# Patient Record
Sex: Male | Born: 1965 | Hispanic: No | Marital: Married | Smoking: Current every day smoker
Health system: Southern US, Community
[De-identification: ages and names within clinical notes are randomized; demographics above are authoritative.]

## PROBLEM LIST (undated history)

## (undated) DIAGNOSIS — G610 Guillain-Barre syndrome: Secondary | ICD-10-CM

## (undated) DIAGNOSIS — K5790 Diverticulosis of intestine, part unspecified, without perforation or abscess without bleeding: Secondary | ICD-10-CM

## (undated) DIAGNOSIS — G4730 Sleep apnea, unspecified: Secondary | ICD-10-CM

## (undated) DIAGNOSIS — E7800 Pure hypercholesterolemia, unspecified: Secondary | ICD-10-CM

## (undated) DIAGNOSIS — R002 Palpitations: Secondary | ICD-10-CM

## (undated) DIAGNOSIS — Z973 Presence of spectacles and contact lenses: Secondary | ICD-10-CM

## (undated) DIAGNOSIS — I10 Essential (primary) hypertension: Secondary | ICD-10-CM

## (undated) HISTORY — DX: Essential (primary) hypertension: I10

## (undated) HISTORY — DX: Diverticulosis of intestine, part unspecified, without perforation or abscess without bleeding: K57.90

## (undated) HISTORY — DX: Guillain-Barre syndrome: G61.0

## (undated) HISTORY — PX: OTHER SURGICAL HISTORY: SHX169

## (undated) HISTORY — PX: FRACTURE SURGERY: SHX138

## (undated) HISTORY — DX: Palpitations: R00.2

---

## 2003-10-14 HISTORY — PX: FRACTURE SURGERY: SHX138

## 2008-12-29 ENCOUNTER — Emergency Department: Payer: Self-pay | Admitting: Unknown Physician Specialty

## 2009-12-05 ENCOUNTER — Emergency Department: Payer: Self-pay | Admitting: Emergency Medicine

## 2009-12-05 ENCOUNTER — Encounter: Payer: Self-pay | Admitting: Cardiovascular Disease

## 2009-12-24 ENCOUNTER — Ambulatory Visit: Payer: Self-pay | Admitting: Cardiovascular Disease

## 2009-12-24 DIAGNOSIS — E781 Pure hyperglyceridemia: Secondary | ICD-10-CM | POA: Insufficient documentation

## 2009-12-24 DIAGNOSIS — R002 Palpitations: Secondary | ICD-10-CM | POA: Insufficient documentation

## 2009-12-24 DIAGNOSIS — I10 Essential (primary) hypertension: Secondary | ICD-10-CM | POA: Insufficient documentation

## 2010-11-12 NOTE — Progress Notes (Signed)
Summary: PHI  PHI   Imported By: Harlon Flor 12/26/2009 08:32:46  _____________________________________________________________________  External Attachment:    Type:   Image     Comment:   External Document
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
## 2011-04-15 ENCOUNTER — Emergency Department: Payer: Self-pay

## 2011-04-28 ENCOUNTER — Encounter: Payer: Self-pay | Admitting: Cardiovascular Disease

## 2014-01-03 ENCOUNTER — Ambulatory Visit: Payer: Self-pay | Admitting: Family Medicine

## 2015-01-31 ENCOUNTER — Emergency Department
Admit: 2015-01-31 | Disposition: A | Discharge status: Home / Self Care | Payer: Self-pay | Admitting: Emergency Medicine

## 2015-01-31 LAB — CBC WITH DIFFERENTIAL/PLATELET
Basophil #: 0 10*3/uL (ref 0.0–0.1)
Basophil %: 0.3 %
Eosinophil #: 0.1 10*3/uL (ref 0.0–0.7)
Eosinophil %: 0.5 %
HCT: 48 % (ref 40.0–52.0)
HGB: 16.7 g/dL (ref 13.0–18.0)
Lymphocyte #: 1.1 10*3/uL (ref 1.0–3.6)
Lymphocyte %: 8.5 %
MCH: 30.8 pg (ref 26.0–34.0)
MCHC: 34.7 g/dL (ref 32.0–36.0)
MCV: 89 fL (ref 80–100)
Monocyte #: 1.1 x10 3/mm — ABNORMAL HIGH (ref 0.2–1.0)
Monocyte %: 7.9 %
Neutrophil #: 11.2 10*3/uL — ABNORMAL HIGH (ref 1.4–6.5)
Neutrophil %: 82.8 %
Platelet: 204 10*3/uL (ref 150–440)
RBC: 5.42 10*6/uL (ref 4.40–5.90)
RDW: 12.4 % (ref 11.5–14.5)
WBC: 13.5 10*3/uL — ABNORMAL HIGH (ref 3.8–10.6)

## 2015-01-31 LAB — COMPREHENSIVE METABOLIC PANEL
Albumin: 4.6 g/dL
Alkaline Phosphatase: 66 U/L
Anion Gap: 11 (ref 7–16)
BUN: 15 mg/dL
Bilirubin,Total: 0.7 mg/dL
Calcium, Total: 9.8 mg/dL
Chloride: 101 mmol/L
Co2: 25 mmol/L
Creatinine: 1.18 mg/dL
EGFR (African American): >60
EGFR (Non-African Amer.): >60
Glucose: 119 mg/dL — ABNORMAL HIGH
Potassium: 4.5 mmol/L
SGOT(AST): 84 U/L — ABNORMAL HIGH
SGPT (ALT): 181 U/L — ABNORMAL HIGH
Sodium: 137 mmol/L
Total Protein: 8.1 g/dL

## 2015-01-31 LAB — URINALYSIS, COMPLETE
Bacteria: NONE SEEN
Bilirubin,UR: NEGATIVE
Glucose,UR: NEGATIVE mg/dL (ref 0–75)
Ketone: NEGATIVE
Leukocyte Esterase: NEGATIVE
Nitrite: NEGATIVE
Ph: 6 (ref 4.5–8.0)
Protein: NEGATIVE
RBC,UR: NONE SEEN /HPF (ref 0–5)
Specific Gravity: 1.01 (ref 1.003–1.030)
Squamous Epithelial: NONE SEEN

## 2015-01-31 LAB — LIPASE, BLOOD: Lipase: 32 U/L

## 2015-06-04 ENCOUNTER — Ambulatory Visit (INDEPENDENT_AMBULATORY_CARE_PROVIDER_SITE_OTHER): Payer: 59 | Admitting: Family Medicine

## 2015-06-04 ENCOUNTER — Encounter: Payer: Self-pay | Admitting: Family Medicine

## 2015-06-04 VITALS — BP 120/90 | HR 70 | Temp 98.3°F | Resp 16 | Ht 69.5 in | Wt 197.8 lb

## 2015-06-04 DIAGNOSIS — K5733 Diverticulitis of large intestine without perforation or abscess with bleeding: Secondary | ICD-10-CM | POA: Diagnosis not present

## 2015-06-04 MED ORDER — HYDROCODONE-ACETAMINOPHEN 5-325 MG PO TABS: 1.0000 | ORAL_TABLET | Freq: Four times a day (QID) | ORAL | Status: DC | PRN

## 2015-06-04 MED ORDER — METRONIDAZOLE 500 MG PO TABS: 500.0000 mg | ORAL_TABLET | Freq: Two times a day (BID) | ORAL | Status: DC

## 2015-06-04 MED ORDER — CIPROFLOXACIN HCL 500 MG PO TABS: 500.0000 mg | ORAL_TABLET | Freq: Two times a day (BID) | ORAL | Status: DC

## 2015-06-04 NOTE — Progress Notes (Signed)
Subjective:     Patient ID: Taylor Medina, male   DOB: 1965-11-25, 49 y.o.   MRN: [id number]  HPI  Chief Complaint  Patient presents with  . Abdominal Pain    Patient comes in office today with concerns of abdominal pain and cramping for about 3 days or more. Patient states that he has a history of diverticulitis and has concerns that he has it again. Patient reports lower abdominal pain described as sharp, and blood in stool.   States pain is in his lower abdomen. Noticed bright red blood yesterday but none today. Treated for diverticulitis in April of this year at [facility] ER. CT scan with "extensive colonic diverticuli. Reports 20 year old brother just diagnosed with advanced colon cancer.   Review of Systems  Constitutional: Negative for fever and chills.  Gastrointestinal: Negative for nausea and vomiting.       Objective:   Physical Exam  Constitutional: He appears well-developed and well-nourished. No distress.  Abdominal: Soft. There is tenderness (lower mid to right lower quadrants). There is no guarding.       Assessment:    1. Diverticulitis of large intestine without perforation or abscess with bleeding - ciprofloxacin (CIPRO) 500 MG tablet; Take 1 tablet (500 mg total) by mouth 2 (two) times daily.  Dispense: 14 tablet; Refill: 0 - metroNIDAZOLE (FLAGYL) 500 MG tablet; Take 1 tablet (500 mg total) by mouth 2 (two) times daily.  Dispense: 14 tablet; Refill: 0 - HYDROcodone-acetaminophen (NORCO/VICODIN) 5-325 MG per tablet; Take 1 tablet by mouth every 6 (six) hours as needed.  Dispense: 28 tablet; Refill: 0    Plan:   Screening colonoscopy after this flare.

## 2015-06-04 NOTE — Patient Instructions (Signed)
Discussed clear liquid diet for 24-48 hours. Once better call for G.I. Referral for early colonoscopy.

## 2015-06-28 ENCOUNTER — Encounter: Payer: Self-pay | Admitting: Family Medicine

## 2015-06-28 ENCOUNTER — Ambulatory Visit (INDEPENDENT_AMBULATORY_CARE_PROVIDER_SITE_OTHER): Payer: 59 | Admitting: Family Medicine

## 2015-06-28 VITALS — BP 110/70 | HR 60 | Temp 98.6°F | Resp 16 | Wt 199.0 lb

## 2015-06-28 DIAGNOSIS — W57XXXA Bitten or stung by nonvenomous insect and other nonvenomous arthropods, initial encounter: Secondary | ICD-10-CM | POA: Diagnosis not present

## 2015-06-28 DIAGNOSIS — T148 Other injury of unspecified body region: Secondary | ICD-10-CM

## 2015-06-28 NOTE — Patient Instructions (Addendum)
Try hydrocortisone cream for redness and itching. Monitor for flu like symptoms in the next 2-4 weeks.

## 2015-06-28 NOTE — Progress Notes (Signed)
Subjective:     Patient ID: Taylor Medina, male   DOB: Aug 31, 1966, 49 y.o.   MRN: [id number]  HPI  Chief Complaint  Patient presents with  . Insect Bite    Patient comes in office today with concern of tick bite that he recieved 2 days ago. Patient reports that tick was located on left side of his upper back and that his girlfriend removed it with her nails. Patient states that area is itchy, red and swollen.   Reports no flu like sx.   Review of Systems  HENT: Positive for tinnitus (reports tinnitus yesterday which is spontaneously resolving).        Objective:   Physical Exam  Constitutional: He appears well-developed and well-nourished. No distress.  Skin:  Insect back on left upper back with linear area of inflammatory flare. No f.b. Visualized with magnification.       Assessment:    1. Tick bite    Plan:   Discussed use of hydrocortisone cream. Monitor for rash improvement and for flu-like sx over the next 2-4 weeks.

## 2015-09-21 ENCOUNTER — Ambulatory Visit (INDEPENDENT_AMBULATORY_CARE_PROVIDER_SITE_OTHER): Payer: 59 | Admitting: Family Medicine

## 2015-09-21 ENCOUNTER — Encounter: Payer: Self-pay | Admitting: Family Medicine

## 2015-09-21 VITALS — BP 114/86 | HR 82 | Temp 98.3°F | Resp 14 | Wt 202.0 lb

## 2015-09-21 DIAGNOSIS — J019 Acute sinusitis, unspecified: Secondary | ICD-10-CM

## 2015-09-21 MED ORDER — DOXYCYCLINE HYCLATE 100 MG PO TABS: 100.0000 mg | ORAL_TABLET | Freq: Two times a day (BID) | ORAL | Status: DC

## 2015-09-21 MED ORDER — HYDROCODONE-HOMATROPINE 5-1.5 MG/5ML PO SYRP: ORAL_SOLUTION | ORAL | Status: DC

## 2015-09-21 NOTE — Progress Notes (Signed)
Subjective:     Patient ID: Taylor Medina, male   DOB: 12/18/1965, 49 y.o.   MRN: [id number]  HPI  Chief Complaint  Patient presents with  . Cough    Symptoms started 1 week ago. At that time he had runny nose, sneezing, headache, sinus pressure and low grade fever. Now it feels like it is all in his chest, coughing-productive-thick/yellow phlegm, chest congestion, SOB, some wheezing. He has taking Mucinex, Alkelsetzer.  Reports persistent purulent sinus congestion. No hx of asthma. Day #9 of symptoms. Accompanied by his wife today.   Review of Systems  Constitutional: Positive for fever (transient low grade fever). Negative for chills.       Objective:   Physical Exam  Constitutional: He appears well-developed and well-nourished. No distress.  Ears: T.M's intact without inflammation Sinuses: non-tender Throat: moderate tonsillar erythema and enlargement Neck: no cervical adenopathy Lungs: clear     Assessment:    1. Acute sinusitis, recurrence not specified, unspecified location - doxycycline (VIBRA-TABS) 100 MG tablet; Take 1 tablet (100 mg total) by mouth 2 (two) times daily.  Dispense: 20 tablet; Refill: 0 - HYDROcodone-homatropine (HYCODAN) 5-1.5 MG/5ML syrup; 5 ml 4-6 hours as needed for cough  Dispense: 240 mL; Refill: 0    Plan:    Discussed use of Mucinex D for congestion, Delsym for cough, and Benadryl for postnasal drainage

## 2015-09-21 NOTE — Patient Instructions (Signed)
Discussed use of Mucinex D for congestion, Delsym for cough, and Benadryl for postnasal drainage 

## 2015-09-26 ENCOUNTER — Telehealth: Payer: Self-pay | Admitting: Family Medicine

## 2015-09-26 NOTE — Telephone Encounter (Signed)
Pt states he was in last week.  Pt is still having yellow sinus congestion and drainage.  Pt is also has a headache.  Pt states his cough is better.  Pt is requesting a Rx to help with the sinus issues.  [facility]  CB#[phone]/MW

## 2015-09-26 NOTE — Telephone Encounter (Signed)
Give the antibiotic longer to work. Call me Friday if sinuses not improving.

## 2015-09-26 NOTE — Telephone Encounter (Signed)
Patient states that he had started antibiotic on Friday he has noticed improvement in his cough and chest congestion, but states that he has been having sinus pain and pressure still and frequent headaches. KW

## 2015-09-26 NOTE — Telephone Encounter (Signed)
Are you taking the antibiotic that I prescribed, doxycycline?

## 2015-09-27 NOTE — Telephone Encounter (Signed)
Patient has been advised and will contact us back on Friday

## 2016-02-29 ENCOUNTER — Ambulatory Visit
Admission: RE | Admit: 2016-02-29 | Discharge: 2016-02-29 | Disposition: A | Discharge status: Home / Self Care | Payer: 59 | Source: Ambulatory Visit | Attending: Family Medicine | Admitting: Family Medicine

## 2016-02-29 ENCOUNTER — Ambulatory Visit (INDEPENDENT_AMBULATORY_CARE_PROVIDER_SITE_OTHER): Payer: 59 | Admitting: Family Medicine

## 2016-02-29 ENCOUNTER — Encounter: Payer: Self-pay | Admitting: Family Medicine

## 2016-02-29 VITALS — BP 110/78 | HR 68 | Temp 98.0°F | Resp 16 | Wt 197.4 lb

## 2016-02-29 DIAGNOSIS — E785 Hyperlipidemia, unspecified: Secondary | ICD-10-CM | POA: Diagnosis not present

## 2016-02-29 DIAGNOSIS — R918 Other nonspecific abnormal finding of lung field: Secondary | ICD-10-CM | POA: Insufficient documentation

## 2016-02-29 DIAGNOSIS — R079 Chest pain, unspecified: Secondary | ICD-10-CM

## 2016-02-29 LAB — EKG 12-LEAD

## 2016-02-29 MED ORDER — DIAZEPAM 5 MG PO TABS: ORAL_TABLET | ORAL | Status: DC

## 2016-02-29 NOTE — Patient Instructions (Signed)
We will call you with the x-ray and lab results. 

## 2016-02-29 NOTE — Progress Notes (Signed)
Subjective:     Patient ID: Taylor Medina, male   DOB: 1966/08/04, 50 y.o.   MRN: [id number] Chief Complaint  Patient presents with  . Chest Pain     Patient comes in office today with complaints of chest pain located on the left side of his chest. Patient describes pain as sharp and states that it radiates down his left arm. Patient denies shortness of breath but states that he has heart palpitations, patient reports that he has had a cough for sometime now mostly in the PM and was unsure if it was related. Patient blood pressure this morning was 151/97 and after getting checked at work it went up to 138/101.  Reports pain lasst just a second or two but has had increasing daily episodes over the last few days. Took ASA today and used prn apresoline as his bp was elevated at work. No recent labs on record. 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 HPI   Review of Systems     Objective:   Physical Exam  Constitutional: He appears well-developed and well-nourished. No distress.  Cardiovascular: Normal rate.   bradycardic  Pulmonary/Chest: Breath sounds normal. He exhibits no tenderness (localies to  his pectoral and sub-pectoral area).       Assessment:    1. Chest pain, unspecified chest pain type - EKG 12-Lead - DG Chest 2 View; Future - Comprehensive metabolic panel - diazepam (VALIUM) 5 MG tablet; One every 8 hours as needed for chest discomfort/stress  Dispense: 30 tablet; Refill: 0  2. Hyperlipidemia - Lipid panel    Plan:    Further f/u pending x-ray and lab results

## 2016-03-04 ENCOUNTER — Telehealth: Payer: Self-pay

## 2016-03-04 LAB — COMPREHENSIVE METABOLIC PANEL
ALT: 121 IU/L — ABNORMAL HIGH (ref 0–44)
AST: 50 IU/L — ABNORMAL HIGH (ref 0–40)
Albumin/Globulin Ratio: 1.6 (ref 1.2–2.2)
Albumin: 4.4 g/dL (ref 3.5–5.5)
Alkaline Phosphatase: 70 IU/L (ref 39–117)
BUN/Creatinine Ratio: 12 (ref 9–20)
BUN: 12 mg/dL (ref 6–24)
Bilirubin Total: 0.4 mg/dL (ref 0.0–1.2)
CO2: 20 mmol/L (ref 18–29)
Calcium: 9.2 mg/dL (ref 8.7–10.2)
Chloride: 97 mmol/L (ref 96–106)
Creatinine, Ser: 1.02 mg/dL (ref 0.76–1.27)
GFR calc Af Amer: 101 mL/min/{1.73_m2} (ref >59)
GFR calc non Af Amer: 87 mL/min/{1.73_m2} (ref >59)
Globulin, Total: 2.7 g/dL (ref 1.5–4.5)
Glucose: 88 mg/dL (ref 65–99)
Potassium: 4.9 mmol/L (ref 3.5–5.2)
Sodium: 138 mmol/L (ref 134–144)
Total Protein: 7.1 g/dL (ref 6.0–8.5)

## 2016-03-04 LAB — LIPID PANEL
Chol/HDL Ratio: 11.2 ratio units — ABNORMAL HIGH (ref 0.0–5.0)
Cholesterol, Total: 268 mg/dL — ABNORMAL HIGH (ref 100–199)
HDL: 24 mg/dL — ABNORMAL LOW (ref >39)
Triglycerides: 1506 mg/dL (ref 0–149)

## 2016-03-04 NOTE — Telephone Encounter (Signed)
Patient has been advised of report, spoke with [facility] rep and had additonal lab orderded. KW

## 2016-03-04 NOTE — Telephone Encounter (Signed)
-----   Message from [name], [place] sent at 03/04/2016  8:01 AM EDT ----- Your cholesterol is high but will add another lab to your current draw to get you LDL level. Please add direct LDL to his current lab draw.

## 2016-03-05 ENCOUNTER — Telehealth: Payer: Self-pay

## 2016-03-05 LAB — LDL CHOLESTEROL, DIRECT: LDL Direct: 63 mg/dL (ref 0–99)

## 2016-03-05 LAB — SPECIMEN STATUS REPORT

## 2016-03-05 NOTE — Telephone Encounter (Signed)
Patient has been advised and is scheduling a follow up appt to discuss. KW

## 2016-03-05 NOTE — Telephone Encounter (Signed)
-----   Message from Rand, [place] sent at 03/05/2016  9:48 AM EDT ----- LDL cholesterol is good at 63 but triglycerides are very high. May wish to come in to the office in the next week to discuss.

## 2016-03-14 ENCOUNTER — Ambulatory Visit (INDEPENDENT_AMBULATORY_CARE_PROVIDER_SITE_OTHER): Payer: 59 | Admitting: Family Medicine

## 2016-03-14 ENCOUNTER — Other Ambulatory Visit: Payer: Self-pay | Admitting: Family Medicine

## 2016-03-14 DIAGNOSIS — E781 Pure hyperglyceridemia: Secondary | ICD-10-CM

## 2016-03-14 DIAGNOSIS — J40 Bronchitis, not specified as acute or chronic: Secondary | ICD-10-CM

## 2016-03-14 MED ORDER — CEFDINIR 300 MG PO CAPS
300.0000 mg | ORAL_CAPSULE | Freq: Two times a day (BID) | ORAL | Status: DC
Start: 2016-03-14 — End: 2016-07-28

## 2016-03-14 NOTE — Patient Instructions (Signed)
Let me know how you did on the antibiotic. We will set up follow up chest x-ray at that time.

## 2016-03-14 NOTE — Progress Notes (Signed)
Patient ID: Taylor Medina, male   DOB: 10-Jan-1966, 50 y.o.   MRN: [id number] Here to review his elevated triglycerides. Will provide diet sheet for now. He is concerned about persistent productive cough x one month and CXR with faint nodule. Discussed treating him for bronchitis then repeating CXR. Repeat lipid profile in 3 months. No charge for this lab review today.

## 2016-03-27 ENCOUNTER — Telehealth: Payer: Self-pay

## 2016-03-27 ENCOUNTER — Other Ambulatory Visit: Payer: Self-pay | Admitting: Family Medicine

## 2016-03-27 DIAGNOSIS — R9389 Abnormal findings on diagnostic imaging of other specified body structures: Secondary | ICD-10-CM

## 2016-03-27 NOTE — Telephone Encounter (Signed)
Patient called saying that he still has constant chest pain. He reports that the pain is located just under the left side of his collarbone. Patient reports that the pain is worse when palpitating. He reports that he has completed his antibiotics, and it did help with his cough. Patient has not been using any other medications OTC to help with pain. Patient wanted to know what else could be done to resolve the pain? He reports that he can see you today after 4pm or come in tomorrow. Please advise. Contact number is [phone]. Thanks!

## 2016-03-27 NOTE — Telephone Encounter (Signed)
Have him pick up x-ray requisition to do f/u CXR (I have placed it up front) and start two Aleve twice daily with food.

## 2016-03-28 ENCOUNTER — Ambulatory Visit
Admission: RE | Admit: 2016-03-28 | Discharge: 2016-03-28 | Disposition: A | Discharge status: Home / Self Care | Payer: 59 | Source: Ambulatory Visit | Attending: Family Medicine | Admitting: Family Medicine

## 2016-03-28 DIAGNOSIS — R938 Abnormal findings on diagnostic imaging of other specified body structures: Secondary | ICD-10-CM | POA: Diagnosis present

## 2016-03-28 DIAGNOSIS — R9389 Abnormal findings on diagnostic imaging of other specified body structures: Secondary | ICD-10-CM

## 2016-03-28 DIAGNOSIS — R918 Other nonspecific abnormal finding of lung field: Secondary | ICD-10-CM | POA: Diagnosis not present

## 2016-03-28 NOTE — Telephone Encounter (Signed)
Advised patient as below.  

## 2016-07-28 ENCOUNTER — Encounter: Payer: Self-pay | Admitting: Urology

## 2016-07-28 ENCOUNTER — Ambulatory Visit (INDEPENDENT_AMBULATORY_CARE_PROVIDER_SITE_OTHER): Payer: 59 | Admitting: Urology

## 2016-07-28 VITALS — BP 128/80 | HR 64 | Ht 69.0 in | Wt 195.1 lb

## 2016-07-28 DIAGNOSIS — N138 Other obstructive and reflux uropathy: Secondary | ICD-10-CM

## 2016-07-28 DIAGNOSIS — N529 Male erectile dysfunction, unspecified: Secondary | ICD-10-CM | POA: Diagnosis not present

## 2016-07-28 DIAGNOSIS — E291 Testicular hypofunction: Secondary | ICD-10-CM

## 2016-07-28 DIAGNOSIS — N401 Enlarged prostate with lower urinary tract symptoms: Secondary | ICD-10-CM | POA: Diagnosis not present

## 2016-07-28 MED ORDER — SILDENAFIL CITRATE 100 MG PO TABS: 100.0000 mg | ORAL_TABLET | Freq: Every day | ORAL | 12 refills | Status: DC | PRN

## 2016-07-28 MED ORDER — SILDENAFIL CITRATE 20 MG PO TABS: ORAL_TABLET | ORAL | 3 refills | Status: DC

## 2016-07-28 NOTE — Progress Notes (Signed)
07/28/2016 4:21 PM   [name] 20-Sep-1966 [id number]  Referring provider: [name], [facility] [place] [place], [place] [address]  Chief Complaint  Patient presents with  . New Patient (Initial Visit)    ED    HPI: Patient is a 50 year old Caucasian male who presents today for erectile dysfunction referred by his primary care provider [name], [place].  Patient is a former patient of ours, but he has not been seen in several years.  He also has a history of hypogonadism and BPH with LUTS.    Erectile dysfunction His SHIM score is 14, which is mild to moderate ED.   He has been having difficulty with erections for several years.   His major complaint is lack of firmness and maintaining an erections satisfactory for intercourse.  His libido is diminished.   His risk factors for ED are age, BPH, hypogonadism, HTN, HLD, hypothyroidism, anxiety, alcohol abuse, history of smoking and blood pressure medications.    He denies any painful erections or curvatures with his erections.   He has tried PDE5-inhibitors in the past.       [place] Name 07/28/16 1541         SHIM: Over the last 6 months:   How do you rate your confidence that you could get and keep an erection? Low     When you had erections with sexual stimulation, how often were your erections hard enough for penetration (entering your partner)? Sometimes (about half the time)     During sexual intercourse, how often were you able to maintain your erection after you had penetrated (entered) your partner? Difficult     During sexual intercourse, how difficult was it to maintain your erection to completion of intercourse? Difficult     When you attempted sexual intercourse, how often was it satisfactory for you? Difficult       SHIM Total Score   SHIM 14        Score: 1-7 Severe ED 8-11 Moderate ED 12-16 Mild-Moderate ED 17-21 Mild ED 22-25 No ED   Hypogonadism Patient is experiencing a  decrease in libido, a lack of energy, a decrease in strength and erections being less strong.  This is indicated by his responses to the ADAM questionnaire.   He is no longer having spontaneous erections at night.  He does not have sleep apnea.      Androgen Deficiency in the Aging Male    [place] Name 07/28/16 1500         Androgen Deficiency in the Aging Male   Do you have a decrease in libido (sex drive) Yes     Do you have lack of energy Yes     Do you have a decrease in strength and/or endurance Yes     Have you lost height No     Have you noticed a decreased "enjoyment of life" No     Are you sad and/or grumpy No     Are your erections less strong Yes     Have you noticed a recent deterioration in your ability to play sports No     Are you falling asleep after dinner No     Has there been a recent deterioration in your work performance No       BPH WITH LUTS His IPSS score today is 6, which is mild lower urinary tract symptomatology. He is mostly satisfied with his quality life due  to his urinary symptoms.  His major complaint today nocturia and post void dribbling.  His nocturia occurs five times a month.  He has had these symptoms for several years.  He denies any dysuria, hematuria or suprapubic pain.  He also denies any recent fevers, chills, nausea or vomiting.  He does not have a family history of PCa.     IPSS    [place] Name 07/28/16 1500         International Prostate Symptom Score   How often have you had the sensation of not emptying your bladder? Not at All     How often have you had to urinate less than every two hours? Not at All     How often have you found you stopped and started again several times when you urinated? Not at All     How often have you found it difficult to postpone urination? Less than 1 in 5 times     How often have you had a weak urinary stream? Not at All     How often have you had to strain to start urination? Not at All     How many times did  you typically get up at night to urinate? 5 Times     Total IPSS Score 6       Quality of Life due to urinary symptoms   If you were to spend the rest of your life with your urinary condition just the way it is now how would you feel about that? Mostly Satisfied        Score:  1-7 Mild 8-19 Moderate 20-35 Severe   PMH: Past Medical History:  Diagnosis Date  . HTN (hypertension)    episodic   . Palpitations     Surgical History: Past Surgical History:  Procedure Laterality Date  . hardware fractions     on R; several surgeries   . hernia repair surgery      Home Medications:    Medication List       Accurate as of 07/28/16  4:21 PM. Always use your most recent med list.          diazepam 5 MG tablet Commonly known as:  VALIUM One every 8 hours as needed for chest discomfort/stress   FLONASE 50 MCG/ACT nasal spray Generic drug:  fluticasone Place 1 spray into the nose daily. Reported on 02/29/2016   hydrALAZINE 10 MG tablet Commonly known as:  APRESOLINE Take 10 mg by mouth 3 (three) times daily as needed.   ibuprofen 200 MG tablet Commonly known as:  ADVIL,MOTRIN Take 400 mg by mouth every 6 (six) hours as needed.   ranitidine 150 MG capsule Commonly known as:  ZANTAC Take 150 mg by mouth 2 (two) times daily.   sildenafil 20 MG tablet Commonly known as:  REVATIO Take 3 to 5 tablets two hours before intercouse on an empty stomach.  Do not take with nitrates.       Allergies:  Allergies  Allergen Reactions  . Penicillins     Family History: Family History  Problem Relation Age of Onset  . Cancer      family hx  . Coronary artery disease      family hx  . Diabetes      family hx    Social History:  reports that he quit smoking about 3 years ago. He has never used smokeless tobacco. He reports that he drinks alcohol. He reports that he does  not use drugs.  ROS: UROLOGY Frequent Urination?: No Hard to postpone urination?:  No Burning/pain with urination?: No Get up at night to urinate?: Yes Leakage of urine?: Yes Urine stream starts and stops?: No Trouble starting stream?: No Do you have to strain to urinate?: No Blood in urine?: No Urinary tract infection?: No Sexually transmitted disease?: No Injury to kidneys or bladder?: No Painful intercourse?: No Weak stream?: No Erection problems?: Yes Penile pain?: No  Gastrointestinal Nausea?: No Vomiting?: No Indigestion/heartburn?: Yes Diarrhea?: No Constipation?: No  Constitutional Fever: No Night sweats?: No Weight loss?: No Fatigue?: Yes  Skin Skin rash/lesions?: No Itching?: No  Eyes Blurred vision?: No Double vision?: No  Ears/Nose/Throat Sore throat?: No Sinus problems?: No  Hematologic/Lymphatic Swollen glands?: No Easy bruising?: No  Cardiovascular Leg swelling?: No Chest pain?: No  Respiratory Cough?: No Shortness of breath?: No  Endocrine Excessive thirst?: No  Musculoskeletal Back pain?: No Joint pain?: Yes  Neurological Headaches?: No Dizziness?: No  Psychologic Depression?: No Anxiety?: No  Physical Exam: BP 128/80   Pulse 64   Ht 5\' 9"  (1.753 m)   Wt 195 lb 1.6 oz (88.5 kg)   BMI 28.81 kg/m   Constitutional: Well nourished. Alert and oriented, No acute distress. HEENT: [place] AT, moist mucus membranes. Trachea midline, no masses. Cardiovascular: No clubbing, cyanosis, or edema. Respiratory: Normal respiratory effort, no increased work of breathing. GI: Abdomen is soft, non tender, non distended, no abdominal masses. Liver and spleen not palpable.  No hernias appreciated.  Stool sample for occult testing is not indicated.   GU: No CVA tenderness.  No bladder fullness or masses.  Patient with circumcised phallus.  Urethral meatus is patent.  No penile discharge. No penile lesions or rashes. Scrotum without lesions, cysts, rashes and/or edema.  Testicles are located scrotally bilaterally. No masses are  appreciated in the testicles. Left and right epididymis are normal. Rectal: Patient with  normal sphincter tone. Anus and perineum without scarring or rashes. No rectal masses are appreciated. Prostate is approximately 45 grams, no nodules are appreciated. Seminal vesicles are normal. Skin: No rashes, bruises or suspicious lesions. Lymph: No cervical or inguinal adenopathy. Neurologic: Grossly intact, no focal deficits, moving all 4 extremities. Psychiatric: Normal mood and affect.  Laboratory Data: Lab Results  Component Value Date   WBC 13.5 (H) 01/31/2015   HGB 16.7 01/31/2015   HCT 48.0 01/31/2015   MCV 89 01/31/2015   PLT 204 01/31/2015    Lab Results  Component Value Date   CREATININE 1.02 03/03/2016        Component Value Date/Time   CHOL 268 (H) 03/03/2016 1112   HDL 24 (L) 03/03/2016 1112   CHOLHDL 11.2 (H) 03/03/2016 1112   [place] Comment 03/03/2016 1112    Lab Results  Component Value Date   AST 50 (H) 03/03/2016   Lab Results  Component Value Date   ALT 121 (H) 03/03/2016     Assessment & Plan:    1. Erectile dysfunction  - SHIM score is 14  - I explained to the patient that in order to achieve an erection it takes good functioning of the nervous system (parasympathetic, sympathetic, sensory and motor), good blood flow into the erectile tissue of the penis and a desire to have sex  - I explained that conditions like diabetes, hypertension, coronary artery disease, peripheral vascular disease, smoking, alcohol consumption, age, sleep apnea and BPH can diminish the ability to have an erection  - We discussed restarting  a PDE5 inhibitor at this time  - He would like to try sildenafil  - RTC in 12 months for repeat SHIM score and exam   2. Hypogonadism  - I explained to patient that the current recommendations from the Endocrine Society reports the diagnosis of hypogonadism requires a serum total testosterone level obtained between 8 and 10 AM at least 2  days apart that is below the laboratory parameters  for normal testosterone  - At this time, the patient does not meet this requirement.  He will return for two morning serum testosterones, two days apart before 10 AM   -I discussed with the patient the side effects of testosterone therapy, such as: enlargement of the prostate gland that may in turn cause LUTS, possible increased risk of PCa, DVT's and/or PE's, possible increased risk of heart attack or stroke, lower sperm count, swelling of the ankles, feet, or body, with or without heart failure, enlarged or painful breasts, have problems breathing while you sleep (sleep apnea), increased prostate specific antigen, mood swings, hypertension and increased red blood cell count.  - I also discussed that some men have had success using clomid for hypogonadism.  It does seem to be more successful in younger men, but there are incidences of good results in middle-aged men.  I explained that it is used in male infertility to stimulate the testicles to make more testosterone/sperm.  There has been no long term data on side effects, but some urologists has been having success with this medication.   3. BPH with LUTS  - IPSS score is 6  - Continue conservative management, avoiding bladder irritants and timed voiding's  - RTC in 12 months for IPSS and exam    Return for patient to return in the morning before 10 AM two days apart for serum testosterone.  These notes generated with voice recognition software. I apologize for typographical errors.  Zara Council, [facility] [address] [place], [place] [address] [phone]

## 2016-07-29 ENCOUNTER — Other Ambulatory Visit: Payer: Self-pay

## 2016-07-29 ENCOUNTER — Other Ambulatory Visit: Payer: 59

## 2016-07-29 DIAGNOSIS — E291 Testicular hypofunction: Secondary | ICD-10-CM

## 2016-07-30 ENCOUNTER — Other Ambulatory Visit: Payer: 59

## 2016-07-30 DIAGNOSIS — E291 Testicular hypofunction: Secondary | ICD-10-CM

## 2016-07-31 ENCOUNTER — Telehealth: Payer: Self-pay

## 2016-07-31 LAB — TESTOSTERONE: Testosterone: 264 ng/dL (ref 264–916)

## 2016-07-31 NOTE — Telephone Encounter (Signed)
Spoke with pt in reference testosterone results. Pt voiced understanding.

## 2016-07-31 NOTE — Telephone Encounter (Signed)
-----   Message from Nori Riis, PA-C sent at 07/31/2016  8:10 AM EDT ----- Patient's testosterone is normal.  He should be having a second testosterone being drawn this week or next.

## 2016-08-04 ENCOUNTER — Other Ambulatory Visit: Payer: Self-pay

## 2016-08-04 DIAGNOSIS — E291 Testicular hypofunction: Secondary | ICD-10-CM

## 2016-08-05 ENCOUNTER — Other Ambulatory Visit: Payer: 59

## 2016-08-08 ENCOUNTER — Other Ambulatory Visit: Payer: 59

## 2016-08-08 DIAGNOSIS — E291 Testicular hypofunction: Secondary | ICD-10-CM

## 2016-08-09 LAB — TESTOSTERONE: Testosterone: 305 ng/dL (ref 264–916)

## 2016-08-11 ENCOUNTER — Telehealth: Payer: Self-pay

## 2016-08-11 NOTE — Telephone Encounter (Signed)
LMOM- both testosterone results are within normal limits therefore do not meet criteria for hypogonadism

## 2016-08-11 NOTE — Telephone Encounter (Signed)
-----   Message from Nori Riis, PA-C sent at 08/10/2016  9:26 PM EDT ----- Please notify the patient that his testosterone levels are within the normal limits as designated by laboratory parameters. At this time he is not hypogonadal and is not a candidate for testosterone therapy.

## 2016-10-24 ENCOUNTER — Encounter: Payer: Self-pay | Admitting: Family Medicine

## 2016-10-24 ENCOUNTER — Ambulatory Visit (INDEPENDENT_AMBULATORY_CARE_PROVIDER_SITE_OTHER): Payer: 59 | Admitting: Family Medicine

## 2016-10-24 VITALS — BP 124/96 | HR 88 | Temp 99.0°F | Resp 16 | Wt 197.2 lb

## 2016-10-24 DIAGNOSIS — F419 Anxiety disorder, unspecified: Secondary | ICD-10-CM | POA: Diagnosis not present

## 2016-10-24 DIAGNOSIS — S199XXA Unspecified injury of neck, initial encounter: Secondary | ICD-10-CM

## 2016-10-24 DIAGNOSIS — G4730 Sleep apnea, unspecified: Secondary | ICD-10-CM | POA: Insufficient documentation

## 2016-10-24 DIAGNOSIS — G4733 Obstructive sleep apnea (adult) (pediatric): Secondary | ICD-10-CM | POA: Insufficient documentation

## 2016-10-24 MED ORDER — DIAZEPAM 5 MG PO TABS: ORAL_TABLET | ORAL | 0 refills | Status: DC

## 2016-10-24 MED ORDER — HYDROCODONE-ACETAMINOPHEN 5-325 MG PO TABS: ORAL_TABLET | ORAL | 0 refills | Status: DC

## 2016-10-24 MED ORDER — CYCLOBENZAPRINE HCL 5 MG PO TABS: 5.0000 mg | ORAL_TABLET | Freq: Three times a day (TID) | ORAL | 0 refills | Status: DC | PRN

## 2016-10-24 NOTE — Patient Instructions (Addendum)
Use ibuprofen 800 mg. 3 x day with meals. Let me know if not improving over the course of the next week. Add heat to your neck for 20 minutes several x day.
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
## 2016-12-08 ENCOUNTER — Ambulatory Visit (INDEPENDENT_AMBULATORY_CARE_PROVIDER_SITE_OTHER): Payer: 59 | Admitting: Family Medicine

## 2016-12-08 ENCOUNTER — Encounter: Payer: Self-pay | Admitting: Family Medicine

## 2016-12-08 VITALS — BP 130/92 | HR 71 | Temp 98.1°F | Resp 16 | Wt 208.2 lb

## 2016-12-08 DIAGNOSIS — J0110 Acute frontal sinusitis, unspecified: Secondary | ICD-10-CM | POA: Diagnosis not present

## 2016-12-08 MED ORDER — DOXYCYCLINE HYCLATE 100 MG PO TABS: 100.0000 mg | ORAL_TABLET | Freq: Two times a day (BID) | ORAL | 0 refills | Status: DC

## 2016-12-08 MED ORDER — HYDROCODONE-HOMATROPINE 5-1.5 MG/5ML PO SYRP: ORAL_SOLUTION | ORAL | 0 refills | Status: DC

## 2016-12-08 NOTE — Patient Instructions (Signed)
Discussed use of Mucinex D and Delsym 

## 2016-12-08 NOTE — Progress Notes (Signed)
Subjective:     Patient ID: Taylor Medina, male   DOB: [birth year], 51 y.o.   MRN: [id number]  HPI  Chief Complaint  Patient presents with  . Sinus Problem    Patient comes in office today with complaints of sinus pain and pressure for the past 6 days. Patient reports productive cough, runny nose/congestion, shortness of breath and pressure above eyes. Patient has been taking otc Day/Nyquil, Ibuprofen and Robistussin DM.   Patient reports increased sinus pressure, purulent sinus drainage, post nasal drainage and accompanying cough   Review of Systems     Objective:   Physical Exam  Constitutional: He appears well-developed and well-nourished. No distress.  Ears: T.M's intact without inflammation Sinuses: mild frontal sinus tenderness Throat: moderate tonsillar enlargement and mild erythema without exudate Neck: left anterior cervical node Lungs: posterior inspiratory and expiratory wheezes     Assessment:    1. Acute non-recurrent frontal sinusitis - doxycycline (VIBRA-TABS) 100 MG tablet; Take 1 tablet (100 mg total) by mouth 2 (two) times daily.  Dispense: 20 tablet; Refill: 0 - HYDROcodone-homatropine (HYCODAN) 5-1.5 MG/5ML syrup; 5 ml 4-6 hours as needed for cough  Dispense: 240 mL; Refill: 0    Plan:    Discussed use of Mucinex D and Delsym.

## 2017-04-21 DIAGNOSIS — G4733 Obstructive sleep apnea (adult) (pediatric): Secondary | ICD-10-CM | POA: Diagnosis not present

## 2017-04-21 DIAGNOSIS — M720 Palmar fascial fibromatosis [Dupuytren]: Secondary | ICD-10-CM | POA: Diagnosis not present

## 2017-05-04 ENCOUNTER — Ambulatory Visit (INDEPENDENT_AMBULATORY_CARE_PROVIDER_SITE_OTHER): Payer: 59 | Admitting: Family Medicine

## 2017-05-04 ENCOUNTER — Encounter: Payer: Self-pay | Admitting: Family Medicine

## 2017-05-04 VITALS — BP 134/90 | HR 64 | Temp 98.4°F | Resp 16 | Wt 206.2 lb

## 2017-05-04 DIAGNOSIS — F419 Anxiety disorder, unspecified: Secondary | ICD-10-CM | POA: Diagnosis not present

## 2017-05-04 DIAGNOSIS — Z131 Encounter for screening for diabetes mellitus: Secondary | ICD-10-CM

## 2017-05-04 DIAGNOSIS — S8991XA Unspecified injury of right lower leg, initial encounter: Secondary | ICD-10-CM

## 2017-05-04 DIAGNOSIS — E781 Pure hyperglyceridemia: Secondary | ICD-10-CM

## 2017-05-04 MED ORDER — DIAZEPAM 5 MG PO TABS: ORAL_TABLET | ORAL | 0 refills | Status: DC

## 2017-05-04 NOTE — Patient Instructions (Addendum)
Continue ibuprofen and splint. If not continuing to improve over the week get the x-ray. We will call you with the lab result.
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
## 2017-05-16 ENCOUNTER — Encounter: Payer: Self-pay | Admitting: Family Medicine

## 2017-05-18 ENCOUNTER — Other Ambulatory Visit: Payer: Self-pay | Admitting: Family Medicine

## 2017-05-18 MED ORDER — BUTALBITAL-APAP-CAFFEINE 50-325-40 MG PO TABS: 1.0000 | ORAL_TABLET | Freq: Four times a day (QID) | ORAL | 0 refills | Status: DC | PRN

## 2017-05-18 NOTE — Telephone Encounter (Signed)
Discussed.

## 2017-05-18 NOTE — Telephone Encounter (Signed)
Pt is requesting a refill for Fioricet for migraines.  Pt states the last time he had this filled was 01/20/15 by Dr Caryn Section.  Pt states he has communicated with Mikki Santee over My chart and Mikki Santee advised pt to contact us to send back a Rx refill to Dr Caryn Section due to Mikki Santee does not prescribe this type of medication.  [place]  CB#[phone]/MW

## 2017-05-18 NOTE — Telephone Encounter (Signed)
Please advise 

## 2017-05-18 NOTE — Telephone Encounter (Signed)
Please call in Fioricet.  

## 2017-05-18 NOTE — Telephone Encounter (Signed)
Rx called in to pharmacy. 

## 2017-05-22 DIAGNOSIS — G4733 Obstructive sleep apnea (adult) (pediatric): Secondary | ICD-10-CM | POA: Diagnosis not present

## 2017-06-22 DIAGNOSIS — G4733 Obstructive sleep apnea (adult) (pediatric): Secondary | ICD-10-CM | POA: Diagnosis not present

## 2017-07-15 DIAGNOSIS — G4733 Obstructive sleep apnea (adult) (pediatric): Secondary | ICD-10-CM | POA: Diagnosis not present

## 2017-07-22 DIAGNOSIS — G4733 Obstructive sleep apnea (adult) (pediatric): Secondary | ICD-10-CM | POA: Diagnosis not present

## 2017-09-11 DIAGNOSIS — G4733 Obstructive sleep apnea (adult) (pediatric): Secondary | ICD-10-CM | POA: Diagnosis not present

## 2017-10-16 ENCOUNTER — Ambulatory Visit (INDEPENDENT_AMBULATORY_CARE_PROVIDER_SITE_OTHER): Payer: 59 | Admitting: Family Medicine

## 2017-10-16 ENCOUNTER — Encounter: Payer: Self-pay | Admitting: Family Medicine

## 2017-10-16 VITALS — BP 150/104 | HR 70 | Temp 98.7°F | Resp 16 | Wt 201.2 lb

## 2017-10-16 DIAGNOSIS — K5733 Diverticulitis of large intestine without perforation or abscess with bleeding: Secondary | ICD-10-CM

## 2017-10-16 DIAGNOSIS — R590 Localized enlarged lymph nodes: Secondary | ICD-10-CM

## 2017-10-16 DIAGNOSIS — K625 Hemorrhage of anus and rectum: Secondary | ICD-10-CM

## 2017-10-16 MED ORDER — CIPROFLOXACIN HCL 500 MG PO TABS: 500.0000 mg | ORAL_TABLET | Freq: Two times a day (BID) | ORAL | 0 refills | Status: DC

## 2017-10-16 MED ORDER — METRONIDAZOLE 500 MG PO TABS: 500.0000 mg | ORAL_TABLET | Freq: Three times a day (TID) | ORAL | 0 refills | Status: DC

## 2017-10-16 NOTE — Patient Instructions (Addendum)
We will call you with the referral. Start bathtub soaks for your hemorrhoid 10 minutes daily. Continue Preparation H.

## 2017-10-16 NOTE — Progress Notes (Signed)
Subjective:     Patient ID: Taylor Medina, male   DOB: [birth year], 52 y.o.   MRN: [id number] Chief Complaint  Patient presents with  . Rectal Bleeding    Patient comes in office today with concerns of rectal bleeding for the past 8 weeks or more. Patient reports blood in stool when wiping and in feces, patient sates that he does have external hemorrhoids that appeared 2 days ag. Patient denight straining or constipation, he complains of intermittent abdominal pain.   [place] Kitchen Dysphagia    Patient reports difficulty swalloiwing liquids and solids for one month.    HPI Reports both bright red and dark bleeding which has been painless until he developed a hemorrhoid recently (improving with otc medication). States he has been moving his bowels up to 7 x day with both tarry stools and blood. Denies precedent constipation with normal bowel pattern of 3 x day. Treated for diverticulitis in 2016 with CT c/w "extensive colonic diverticulosis". Brother has colon cancer but no family hx of IBD. Reports neck glands swollen contributing to feeling of difficulty swallowing. Review of Systems  Constitutional:       Only 5# weight loss in last 5 months.       Objective:   Physical Exam  Constitutional: He appears well-developed and well-nourished. No distress.  HENT:  Mildly enlarged tonsils with moderate posterior pharyngeal erythema  Abdominal: Soft. There is tenderness (mild in mid-line lower quadrant ). There is no guarding.  Genitourinary:  Genitourinary Comments: Rectal exam with skin tag and mildly tender/tense hemorrhoid. Scant stool on glove which is heme negative.  Lymphadenopathy:    He has cervical adenopathy (anterior).       Assessment:    1. Rectal bleeding - Ambulatory referral to Gastroenterology - CBC with Differential/Platelet  2. Diverticulitis of large intestine with bleeding, unspecified complication statu - ciprofloxacin (CIPRO) 500 MG tablet; Take 1 tablet (500 mg total) by  mouth 2 (two) times daily.  Dispense: 14 tablet; Refill: 0 - metroNIDAZOLE (FLAGYL) 500 MG tablet; Take 1 tablet (500 mg total) by mouth 3 (three) times daily.  Dispense: 21 tablet; Refill: 0  3. Anterior cervical adenopathy: monitor for improvement on abx.    Plan:   We will call with referral information and lab result. Start bathtub soaks for our hemorrhoid.

## 2017-10-17 LAB — CBC WITH DIFFERENTIAL/PLATELET
Basophils Absolute: 0 10*3/uL (ref 0.0–0.2)
Basos: 0 %
EOS (ABSOLUTE): 0.4 10*3/uL (ref 0.0–0.4)
Eos: 6 %
Hematocrit: 48 % (ref 37.5–51.0)
Hemoglobin: 16.7 g/dL (ref 13.0–17.7)
Immature Grans (Abs): 0 10*3/uL (ref 0.0–0.1)
Immature Granulocytes: 0 %
Lymphocytes Absolute: 2 10*3/uL (ref 0.7–3.1)
Lymphs: 28 %
MCH: 32 pg (ref 26.6–33.0)
MCHC: 34.8 g/dL (ref 31.5–35.7)
MCV: 92 fL (ref 79–97)
Monocytes Absolute: 0.5 10*3/uL (ref 0.1–0.9)
Monocytes: 8 %
Neutrophils Absolute: 4.2 10*3/uL (ref 1.4–7.0)
Neutrophils: 58 %
Platelets: 228 10*3/uL (ref 150–379)
RBC: 5.22 x10E6/uL (ref 4.14–5.80)
RDW: 13.1 % (ref 12.3–15.4)
WBC: 7.1 10*3/uL (ref 3.4–10.8)

## 2017-10-19 ENCOUNTER — Telehealth: Payer: Self-pay

## 2017-10-19 ENCOUNTER — Ambulatory Visit (INDEPENDENT_AMBULATORY_CARE_PROVIDER_SITE_OTHER): Payer: 59 | Admitting: Gastroenterology

## 2017-10-19 ENCOUNTER — Encounter: Payer: Self-pay | Admitting: Gastroenterology

## 2017-10-19 VITALS — BP 133/80 | HR 69 | Ht 69.0 in | Wt 199.2 lb

## 2017-10-19 DIAGNOSIS — K921 Melena: Secondary | ICD-10-CM | POA: Diagnosis not present

## 2017-10-19 DIAGNOSIS — R1030 Lower abdominal pain, unspecified: Secondary | ICD-10-CM | POA: Diagnosis not present

## 2017-10-19 DIAGNOSIS — R935 Abnormal findings on diagnostic imaging of other abdominal regions, including retroperitoneum: Secondary | ICD-10-CM | POA: Diagnosis not present

## 2017-10-19 DIAGNOSIS — R197 Diarrhea, unspecified: Secondary | ICD-10-CM | POA: Diagnosis not present

## 2017-10-19 NOTE — Progress Notes (Signed)
Taylor Medina [address]  [place]  [place], [place] [address]  Main: [phone]  Fax: [phone]   Gastroenterology Consultation  Referring Provider:     [name], [place] Primary Care Physician:  [name], [place] Primary Gastroenterologist:  Dr. Vonda Medina Reason for Consultation:     BRBPR        HPI:   Taylor Medina is a 52 y.o. y/o male referred for consultation & management  by Dr. [name], [place].  Patient reports 3-15-month history of hematochezia.  He reports formed stools but sees bright red to dark red blood streaks in his stools every day to every other day for 3-4 months.  Denies any abdominal pain with the symptoms.  Denies any nausea vomiting.  Denies any significant weight loss.  He states he chronically has 3 formed stools daily but until 3-4 months ago he did not have blood in his stool.  Describes intermittent episodes of bright red blood per rectum that he has seen twice in his life prior to this and attributes this to hemorrhoids.  Reports feeling skin tags or hemorrhoids when he wipes.  Patient has had history of diverticulitis in the past.  In 2016 he went to the ER with severe abdominal pain and CT showed uncomplicated sigmoid diverticulitis.  He has never had a colonoscopy.  He has a brother who was diagnosed with colon cancer at 50 years of age and had to have a colostomy.  He visited his memory care physician about 3-4 days ago who prescribed him a course of antibiotics due to the blood in his stool and previous history of diverticulitis.  However, patient denies any abdominal pain since his symptoms have started, and he does not recall the symptoms being similar to his episode of diverticulitis in 2016.  Patient denies any dysphagia to solid foods or liquids.  However, complains of intermittent cough over the last 1-2 months and swollen glands, and describes some discomfort with swallowing pills for the same duration of time.  No  previous EGDs.  Takes ibuprofen about once a month.  Past Medical History:  Diagnosis Date  . HTN (hypertension)    episodic   . Palpitations     Past Surgical History:  Procedure Laterality Date  . hardware fractions     on R; several surgeries   . hernia repair surgery      Prior to Admission medications   Medication Sig Start Date End Date Taking? Authorizing Provider  butalbital-acetaminophen-caffeine (FIORICET, ESGIC) [phone] MG tablet Take 1-2 tablets by mouth every 6 (six) hours as needed for headache. 05/18/17 05/18/18  Birdie Sons, MD  ciprofloxacin (CIPRO) 500 MG tablet Take 1 tablet (500 mg total) by mouth 2 (two) times daily. 10/16/17   [name], PA  diazepam (VALIUM) 5 MG tablet One every 8 hours as needed for chest discomfort/stress 05/04/17   [name], PA  fluticasone (FLONASE) 50 MCG/ACT nasal spray Place 1 spray into the nose daily. Reported on 02/29/2016    [provider]  hydrALAZINE (APRESOLINE) 10 MG tablet Take 10 mg by mouth 3 (three) times daily as needed.      [provider]  ibuprofen (ADVIL,MOTRIN) 200 MG tablet Take 400 mg by mouth every 6 (six) hours as needed.    [provider]  metroNIDAZOLE (FLAGYL) 500 MG tablet Take 1 tablet (500 mg total) by mouth 3 (three) times daily. 10/16/17   [name], PA  ranitidine (ZANTAC) 150 MG  capsule Take 150 mg by mouth 2 (two) times daily.    [provider]  sildenafil (REVATIO) 20 MG tablet Take 3 to 5 tablets two hours before intercouse on an empty stomach.  Do not take with nitrates. 07/28/16   Nori Riis, PA-C    Family History  Problem Relation Age of Onset  . Cancer Unknown        family hx  . Coronary artery disease Unknown        family hx  . Diabetes Unknown        family hx     Social History   Tobacco Use  . Smoking status: Former Smoker    Last attempt to quit: 07/28/2013    Years since quitting: 4.2  . Smokeless tobacco: Never  Used  Substance Use Topics  . Alcohol use: Yes    Alcohol/week: 0.0 oz  . Drug use: No    Allergies as of 10/19/2017  . (No Active Allergies)    Review of Systems:    All systems reviewed and negative except where noted in HPI.   Physical Exam:  BP 133/80   Pulse 69   Ht 5\' 9"  (1.753 m)   Wt 199 lb 3.2 oz (90.4 kg)   BMI 29.42 kg/m  No LMP for male patient. Psych:  Alert and cooperative. Normal mood and affect. General:   Alert,  Well-developed, well-nourished, pleasant and cooperative in NAD Head:  Normocephalic and atraumatic. Eyes:  Sclera clear, no icterus.   Conjunctiva pink. Ears:  Normal auditory acuity. Nose:  No deformity, discharge, or lesions. Mouth:  No deformity or lesions,oropharynx pink & moist. Neck:  Supple; no masses or thyromegaly. Lungs:  Respirations even and unlabored.  Clear throughout to auscultation.   No wheezes, crackles, or rhonchi. No acute distress. Heart:  Regular rate and rhythm; no murmurs, clicks, rubs, or gallops. Abdomen:  Normal bowel sounds.  No bruits.  Soft, non-tender and non-distended without masses, hepatosplenomegaly or hernias noted.  No guarding or rebound tenderness.    Msk:  Symmetrical without gross deformities. Good, equal movement & strength bilaterally. Pulses:  Normal pulses noted. Extremities:  No clubbing or edema.  No cyanosis. Neurologic:  Alert and oriented x3;  grossly normal neurologically. Skin:  Intact without significant lesions or rashes. No jaundice. Lymph Nodes:  No significant cervical adenopathy. Psych:  Alert and cooperative. Normal mood and affect.   Labs: CBC    Component Value Date/Time   WBC 7.1 10/16/2017 1522   WBC 13.5 (H) 01/31/2015 1638   RBC 5.22 10/16/2017 1522   RBC 5.42 01/31/2015 1638   HGB 16.7 10/16/2017 1522   HCT 48.0 10/16/2017 1522   PLT 228 10/16/2017 1522   MCV 92 10/16/2017 1522   MCV 89 01/31/2015 1638   MCH 32.0 10/16/2017 1522   MCH 30.8 01/31/2015 1638   MCHC 34.8  10/16/2017 1522   MCHC 34.7 01/31/2015 1638   RDW 13.1 10/16/2017 1522   RDW 12.4 01/31/2015 1638   LYMPHSABS 2.0 10/16/2017 1522   LYMPHSABS 1.1 01/31/2015 1638   MONOABS 1.1 (H) 01/31/2015 1638   EOSABS 0.4 10/16/2017 1522   EOSABS 0.1 01/31/2015 1638   BASOSABS 0.0 10/16/2017 1522   BASOSABS 0.0 01/31/2015 1638   CMP     Component Value Date/Time   NA 138 03/03/2016 1112   NA 137 01/31/2015 1638   K 4.9 03/03/2016 1112   K 4.5 01/31/2015 1638   CL 97 03/03/2016 1112  CL 101 01/31/2015 1638   CO2 20 03/03/2016 1112   CO2 25 01/31/2015 1638   GLUCOSE 88 03/03/2016 1112   GLUCOSE 119 (H) 01/31/2015 1638   BUN 12 03/03/2016 1112   BUN 15 01/31/2015 1638   CREATININE 1.02 03/03/2016 1112   CREATININE 1.18 01/31/2015 1638   CALCIUM 9.2 03/03/2016 1112   CALCIUM 9.8 01/31/2015 1638   PROT 7.1 03/03/2016 1112   PROT 8.1 01/31/2015 1638   ALBUMIN 4.4 03/03/2016 1112   ALBUMIN 4.6 01/31/2015 1638   AST 50 (H) 03/03/2016 1112   AST 84 (H) 01/31/2015 1638   ALT 121 (H) 03/03/2016 1112   ALT 181 (H) 01/31/2015 1638   ALKPHOS 70 03/03/2016 1112   ALKPHOS 66 01/31/2015 1638   BILITOT 0.4 03/03/2016 1112   BILITOT 0.7 01/31/2015 1638   GFRNONAA 87 03/03/2016 1112   GFRNONAA >60 01/31/2015 1638   GFRAA 101 03/03/2016 1112   GFRAA >60 01/31/2015 1638    Imaging Studies: No results found.  Assessment and Plan:   BASEL DEFALCO is a 52 y.o. y/o male has been referred for hematochezia with normal hemoglobin and history of hemorrhoids and diverticulitis  Patient's hematochezia is likely related to his hemorrhoids His hemoglobin is completely normal despite 3-4-month history of bright red blood per rectum He has no abdominal pain and no clinical evidence of IBD or colon malignancy.  No weight loss. However, given his family history of colon cancer in his brother colonoscopy is indicated for high risk screening, and would also allow for evaluation of his red blood per  rectum.  His CT scan in 4580 showed uncomplicated sigmoid diverticulitis, his primary care physician has given him a course of antibiotics for possible diverticulitis due to his ongoing symptoms.  We will obtain a CT scan first prior to the colonoscopy to evaluate for any inflammation, and if present, colonoscopy can be done in 6-8 weeks.  If no inflammation present, can proceed with colonoscopy sooner.  I have asked the patient to follow-up with his primary care physician about his cough for 2 months as he states it is not improving.  He reports due to swollen glands in his neck he has some problems with swallowing pills as well.  However denies any specific dysphagia, or odynophagia with solids or liquids.  Denies chronic heartburn.  After primary care physician addresses this and if dysphagia continues, EGD can be considered with the colonoscopy in the near future.  In the meantime, patient will start a high-fiber diet to maintain soft stools to help with his bright red blood per rectum possibly due to hemorrhoids.  Will await CT scan results and then schedule colonoscopy.  Will need CMP to evaluate creatinine prior to CT scan.  Dr Taylor Medina

## 2017-10-19 NOTE — Telephone Encounter (Signed)
Patient was advised. KW 

## 2017-10-19 NOTE — Patient Instructions (Signed)
Schedule CT abdomin and pelvis with/without contrast for diarrhea, abdominal pain. If normal will probably proceed with colonoscopy. F/U 3 months High Fiber handout given. LABS: CMP F/U with PCP regarding throat symptoms, ie: cough. May do EGD after pt sees PCP,

## 2017-10-19 NOTE — Telephone Encounter (Signed)
-----   Message from [name], [place] sent at 10/19/2017  7:24 AM EST ----- Blood count normal-no anemia

## 2017-10-19 NOTE — Telephone Encounter (Signed)
Informed pt of CT abdomin and pelvis is scheduled for 10/27/17 at [facility] [address]. To be NPO after midnight and to pick up his 2 bottles of contrast this week or Monday.

## 2017-10-19 NOTE — Addendum Note (Signed)
Addended by: Vonda Antigua on: 10/19/2017 02:03 PM   Modules accepted: Orders

## 2017-10-27 ENCOUNTER — Encounter: Payer: Self-pay | Admitting: Family Medicine

## 2017-10-27 ENCOUNTER — Ambulatory Visit: Admission: RE | Admit: 2017-10-27 | Payer: 59 | Source: Ambulatory Visit

## 2017-10-27 ENCOUNTER — Ambulatory Visit
Admission: RE | Admit: 2017-10-27 | Discharge: 2017-10-27 | Disposition: A | Discharge status: Home / Self Care | Payer: 59 | Source: Ambulatory Visit | Attending: Gastroenterology | Admitting: Gastroenterology

## 2017-10-27 DIAGNOSIS — R109 Unspecified abdominal pain: Secondary | ICD-10-CM | POA: Diagnosis not present

## 2017-10-27 DIAGNOSIS — K5730 Diverticulosis of large intestine without perforation or abscess without bleeding: Secondary | ICD-10-CM | POA: Insufficient documentation

## 2017-10-27 DIAGNOSIS — R1030 Lower abdominal pain, unspecified: Secondary | ICD-10-CM | POA: Diagnosis present

## 2017-10-27 MED ORDER — IOPAMIDOL (ISOVUE-300) INJECTION 61%
100.0000 mL | Freq: Once | INTRAVENOUS | Status: AC | PRN
  Administered 2017-10-27: 100 mL via INTRAVENOUS

## 2017-10-31 ENCOUNTER — Other Ambulatory Visit: Payer: Self-pay | Admitting: Family Medicine

## 2017-10-31 DIAGNOSIS — F419 Anxiety disorder, unspecified: Secondary | ICD-10-CM

## 2017-11-02 ENCOUNTER — Other Ambulatory Visit: Payer: Self-pay | Admitting: Family Medicine

## 2017-11-02 DIAGNOSIS — F419 Anxiety disorder, unspecified: Secondary | ICD-10-CM

## 2017-11-02 MED ORDER — DIAZEPAM 5 MG PO TABS: ORAL_TABLET | ORAL | 0 refills | Status: DC

## 2017-11-02 NOTE — Progress Notes (Signed)
Called in prescription to [email or web address]

## 2017-11-09 ENCOUNTER — Telehealth: Payer: Self-pay

## 2017-11-09 ENCOUNTER — Other Ambulatory Visit: Payer: Self-pay

## 2017-11-09 DIAGNOSIS — Z800 Family history of malignant neoplasm of digestive organs: Secondary | ICD-10-CM

## 2017-11-09 NOTE — Telephone Encounter (Signed)
Pt scheduled for a colonoscopy at [facility] with [place].  Pt stated his cough is residual from a cold. He feels the issue swallowing is in his throat so he will contact the ENT to schedule an appt.

## 2017-11-09 NOTE — Telephone Encounter (Signed)
-----   Message from Virgel Manifold, MD sent at 11/06/2017  4:04 PM EST ----- Jackelyn Poling please let patient know, #1 his CT scan did not show any inflammation or diverticulitis.  We can proceed with a colonoscopy at this time, please schedule him for this 2.  Please ask him, if his cough has resolved, and if not he needs to see his primary care doctor before his colonoscopy 3.  Is he still having pr.oblem swallowing?  If yes, please schedule him for the EGD with a colonoscopy.  If no, please only schedule him for the colonoscopy.  Colonoscopy indication: High risk screening, family history of colon cancer

## 2017-11-19 ENCOUNTER — Encounter: Payer: Self-pay | Admitting: Family Medicine

## 2017-11-19 ENCOUNTER — Ambulatory Visit (INDEPENDENT_AMBULATORY_CARE_PROVIDER_SITE_OTHER): Payer: 59 | Admitting: Family Medicine

## 2017-11-19 VITALS — BP 144/90 | HR 88 | Temp 99.2°F | Resp 16 | Wt 203.8 lb

## 2017-11-19 DIAGNOSIS — K122 Cellulitis and abscess of mouth: Secondary | ICD-10-CM

## 2017-11-19 LAB — POCT RAPID STREP A (OFFICE): Rapid Strep A Screen: NEGATIVE

## 2017-11-19 MED ORDER — AMOXICILLIN 875 MG PO TABS: 875.0000 mg | ORAL_TABLET | Freq: Two times a day (BID) | ORAL | 0 refills | Status: DC

## 2017-11-19 NOTE — Patient Instructions (Signed)
Let me know if new symptoms occur. Salt water gargles and ibuprofen for the throat discomfort.

## 2017-11-19 NOTE — Progress Notes (Signed)
Subjective:     Patient ID: Taylor Medina, male   DOB: 1966-02-19, 52 y.o.   MRN: [id number] Chief Complaint  Patient presents with  . Sore Throat    Patient comes in office today with complaints of a sore throat that began this morning at 4AM. Patient reports that his uvula appears to be larger than normal and has noticed white spots. Patient reports that his fever has ranged today from 99.1-99.5, patient has tried taking Ibuprofen and Benadryl   HPI States he has just recovered from a cold in the last two weeks.  Review of Systems     Objective:   Physical Exam  Constitutional: He appears well-developed and well-nourished. No distress.  Ears: T.M's intact without inflammation Throat: mild tonsillar enlargement and erythema. Uvula is also swollen and inflamed. Neck: no cervical adenopathy Lungs: clear     Assessment:    1. Uvulitis - POCT rapid strep A - amoxicillin (AMOXIL) 875 MG tablet; Take 1 tablet (875 mg total) by mouth 2 (two) times daily.  Dispense: 20 tablet; Refill: 0    Plan:    Further f/u prn new symptoms.

## 2017-12-07 ENCOUNTER — Telehealth: Payer: Self-pay

## 2017-12-07 ENCOUNTER — Other Ambulatory Visit: Payer: Self-pay

## 2017-12-07 MED ORDER — NA SULFATE-K SULFATE-MG SULF 17.5-3.13-1.6 GM/177ML PO SOLN: 1.0000 | Freq: Once | ORAL | 0 refills | Status: AC

## 2017-12-07 NOTE — Telephone Encounter (Signed)
Patients colonoscopy instructions have been emailed to his personal address and rx for bowel prep has been faxed electronically to [facility].  Thanks [employer]

## 2017-12-09 ENCOUNTER — Encounter: Payer: Self-pay | Admitting: Emergency Medicine

## 2017-12-10 ENCOUNTER — Ambulatory Visit: Payer: 59 | Admitting: Certified Registered Nurse Anesthetist

## 2017-12-10 ENCOUNTER — Encounter: Payer: Self-pay | Admitting: *Deleted

## 2017-12-10 ENCOUNTER — Ambulatory Visit
Admission: RE | Admit: 2017-12-10 | Discharge: 2017-12-10 | Disposition: A | Discharge status: Home / Self Care | Payer: 59 | Source: Ambulatory Visit | Attending: Gastroenterology | Admitting: Gastroenterology

## 2017-12-10 ENCOUNTER — Encounter
Admission: RE | Disposition: A | Discharge status: Home / Self Care | Payer: Self-pay | Source: Ambulatory Visit | Attending: Gastroenterology

## 2017-12-10 DIAGNOSIS — K635 Polyp of colon: Secondary | ICD-10-CM | POA: Insufficient documentation

## 2017-12-10 DIAGNOSIS — Z800 Family history of malignant neoplasm of digestive organs: Secondary | ICD-10-CM

## 2017-12-10 DIAGNOSIS — K644 Residual hemorrhoidal skin tags: Secondary | ICD-10-CM

## 2017-12-10 DIAGNOSIS — R1310 Dysphagia, unspecified: Secondary | ICD-10-CM | POA: Insufficient documentation

## 2017-12-10 DIAGNOSIS — G4730 Sleep apnea, unspecified: Secondary | ICD-10-CM | POA: Diagnosis not present

## 2017-12-10 DIAGNOSIS — K2960 Other gastritis without bleeding: Secondary | ICD-10-CM | POA: Diagnosis not present

## 2017-12-10 DIAGNOSIS — D122 Benign neoplasm of ascending colon: Secondary | ICD-10-CM | POA: Diagnosis not present

## 2017-12-10 DIAGNOSIS — K2289 Other specified disease of esophagus: Secondary | ICD-10-CM

## 2017-12-10 DIAGNOSIS — K2100 Gastro-esophageal reflux disease with esophagitis, without bleeding: Secondary | ICD-10-CM

## 2017-12-10 DIAGNOSIS — D124 Benign neoplasm of descending colon: Secondary | ICD-10-CM

## 2017-12-10 DIAGNOSIS — Z9989 Dependence on other enabling machines and devices: Secondary | ICD-10-CM | POA: Diagnosis not present

## 2017-12-10 DIAGNOSIS — K3189 Other diseases of stomach and duodenum: Secondary | ICD-10-CM | POA: Diagnosis not present

## 2017-12-10 DIAGNOSIS — Z1211 Encounter for screening for malignant neoplasm of colon: Secondary | ICD-10-CM

## 2017-12-10 DIAGNOSIS — K228 Other specified diseases of esophagus: Secondary | ICD-10-CM | POA: Diagnosis not present

## 2017-12-10 DIAGNOSIS — Z79899 Other long term (current) drug therapy: Secondary | ICD-10-CM | POA: Diagnosis not present

## 2017-12-10 DIAGNOSIS — Z87891 Personal history of nicotine dependence: Secondary | ICD-10-CM | POA: Insufficient documentation

## 2017-12-10 DIAGNOSIS — K5730 Diverticulosis of large intestine without perforation or abscess without bleeding: Secondary | ICD-10-CM | POA: Insufficient documentation

## 2017-12-10 DIAGNOSIS — R1319 Other dysphagia: Secondary | ICD-10-CM

## 2017-12-10 DIAGNOSIS — K200 Eosinophilic esophagitis: Secondary | ICD-10-CM | POA: Diagnosis not present

## 2017-12-10 DIAGNOSIS — K5790 Diverticulosis of intestine, part unspecified, without perforation or abscess without bleeding: Secondary | ICD-10-CM | POA: Diagnosis not present

## 2017-12-10 DIAGNOSIS — D126 Benign neoplasm of colon, unspecified: Secondary | ICD-10-CM | POA: Diagnosis not present

## 2017-12-10 HISTORY — PX: COLONOSCOPY WITH PROPOFOL: SHX5780

## 2017-12-10 HISTORY — PX: ESOPHAGOGASTRODUODENOSCOPY (EGD) WITH PROPOFOL: SHX5813

## 2017-12-10 LAB — HM COLONOSCOPY

## 2017-12-10 SURGERY — COLONOSCOPY WITH PROPOFOL
Anesthesia: General

## 2017-12-10 MED ORDER — PROPOFOL 10 MG/ML IV BOLUS
INTRAVENOUS | Status: AC
  Filled 2017-12-10: qty 20

## 2017-12-10 MED ORDER — MIDAZOLAM HCL 2 MG/2ML IJ SOLN
INTRAMUSCULAR | Status: DC | PRN
  Administered 2017-12-10: 2 mg via INTRAVENOUS

## 2017-12-10 MED ORDER — PROPOFOL 500 MG/50ML IV EMUL
INTRAVENOUS | Status: DC | PRN
  Administered 2017-12-10: 200 ug/kg/min via INTRAVENOUS

## 2017-12-10 MED ORDER — LIDOCAINE HCL (CARDIAC) 20 MG/ML IV SOLN
INTRAVENOUS | Status: DC | PRN
  Administered 2017-12-10: 50 mg via INTRAVENOUS

## 2017-12-10 MED ORDER — FENTANYL CITRATE (PF) 100 MCG/2ML IJ SOLN
INTRAMUSCULAR | Status: DC | PRN
  Administered 2017-12-10 (×2): 50 ug via INTRAVENOUS

## 2017-12-10 MED ORDER — FENTANYL CITRATE (PF) 100 MCG/2ML IJ SOLN
INTRAMUSCULAR | Status: AC
  Filled 2017-12-10: qty 2

## 2017-12-10 MED ORDER — SODIUM CHLORIDE 0.9 % IV SOLN
INTRAVENOUS | Status: DC
  Administered 2017-12-10: 1000 mL via INTRAVENOUS

## 2017-12-10 MED ORDER — PROPOFOL 500 MG/50ML IV EMUL
INTRAVENOUS | Status: AC
  Filled 2017-12-10: qty 50

## 2017-12-10 MED ORDER — MIDAZOLAM HCL 2 MG/2ML IJ SOLN
INTRAMUSCULAR | Status: AC
  Filled 2017-12-10: qty 2

## 2017-12-10 MED ORDER — OMEPRAZOLE 20 MG PO CPDR: 20.0000 mg | DELAYED_RELEASE_CAPSULE | Freq: Every day | ORAL | 1 refills | Status: DC

## 2017-12-10 MED ORDER — PROPOFOL 10 MG/ML IV BOLUS
INTRAVENOUS | Status: DC | PRN
  Administered 2017-12-10: 20 mg via INTRAVENOUS
  Administered 2017-12-10: 40 mg via INTRAVENOUS
  Administered 2017-12-10: 50 mg via INTRAVENOUS
  Administered 2017-12-10 (×2): 40 mg via INTRAVENOUS

## 2017-12-10 NOTE — Anesthesia Post-op Follow-up Note (Signed)
Anesthesia QCDR form completed.        

## 2017-12-10 NOTE — Op Note (Signed)
[facility]astroenterology Patient Name: Taylor Medina Procedure Date: 12/10/2017 3:41 PM MRN: [id number] Account #: [id number] Date of Birth: 1965/11/03 Admit Type: Outpatient Age: 52 Room: [facility] ENDO ROOM 3 Gender: Male Note Status: Finalized Procedure:            Upper GI endoscopy Indications:          Dysphagia Providers:            Deshundra Waller B. Bonna Gains MD, MD Referring MD:         Rose Fillers. Jaynie Crumble, MD (Referring MD) Medicines:            Monitored Anesthesia Care Complications:        No immediate complications. Procedure:            Pre-Anesthesia Assessment:                       - The risks and benefits of the procedure and the                        sedation options and risks were discussed with the                        patient. All questions were answered and informed                        consent was obtained.                       - Patient identification and proposed procedure were                        verified prior to the procedure.                       - ASA Grade Assessment: II - A patient with mild                        systemic disease.                       After obtaining informed consent, the endoscope was                        passed under direct vision. Throughout the procedure,                        the patient's blood pressure, pulse, and oxygen                        saturations were monitored continuously. The Endoscope                        was introduced through the mouth, and advanced to the                        second part of duodenum. The upper GI endoscopy was                        accomplished with ease. The patient tolerated the  procedure well. Findings:      Mucosal changes including feline appearance were found in the entire       esophagus. Biopsies were obtained from the proximal and distal esophagus       with cold forceps for histology of suspected eosinophilic esophagitis.      The  Z-line was irregular.      LA Grade B (one or more mucosal breaks greater than 5 mm, not extending       between the tops of two mucosal folds) esophagitis with no bleeding was       found.      Patchy mildly erythematous mucosa without bleeding was found in the       gastric antrum. Biopsies were obtained in the gastric body, at the       incisura and in the gastric antrum with cold forceps for histology.      Localized mildly congested mucosa without active bleeding and with no       stigmata of bleeding was found in the duodenal bulb. Biopsies were taken       with a cold forceps for histology.      The second portion of the duodenum was normal. Impression:           - Esophageal mucosal changes suggestive of eosinophilic                        esophagitis. Biopsied.                       - Z-line irregular.                       - LA Grade B reflux esophagitis.                       - Erythematous mucosa in the antrum.                       - Congested duodenal mucosa. Biopsied.                       - Normal second portion of the duodenum.                       - Biopsies were obtained in the gastric body, at the                        incisura and in the gastric antrum. Recommendation:       - Await pathology results.                       - Advance diet as tolerated.                       - Continue present medications.                       - Follow an antireflux regimen.                       - Take PPI once daily 30 mins before breakfast if not                        already doing  so.                       - Return to my office in 4 weeks.                       - Avoid NSAIDs except Aspirin if medically indicated Procedure Code(s):    --- Professional ---                       [phone], Esophagogastroduodenoscopy, flexible, transoral;                        with biopsy, single or multiple Diagnosis Code(s):    --- Professional ---                       K22.8, Other specified  diseases of esophagus                       K21.0, Gastro-esophageal reflux disease with esophagitis                       K31.89, Other diseases of stomach and duodenum                       R13.10, Dysphagia, unspecified CPT copyright 2016 American Medical Association. All rights reserved. The codes documented in this report are preliminary and upon coder review may  be revised to meet current compliance requirements.  Vonda Antigua, MD Margretta Sidle B. Bonna Gains MD, MD 12/10/2017 4:06:13 PM This report has been signed electronically. Number of Addenda: 0 Note Initiated On: 12/10/2017 3:41 PM      [facility]

## 2017-12-10 NOTE — Anesthesia Postprocedure Evaluation (Signed)
Anesthesia Post Note  Patient: Taylor Medina  Procedure(s) Performed: COLONOSCOPY WITH PROPOFOL (N/A ) ESOPHAGOGASTRODUODENOSCOPY (EGD) WITH PROPOFOL  Patient location during evaluation: Endoscopy Anesthesia Type: General Level of consciousness: awake and alert Pain management: pain level controlled Vital Signs Assessment: post-procedure vital signs reviewed and stable Respiratory status: spontaneous breathing, nonlabored ventilation, respiratory function stable and patient connected to nasal cannula oxygen Cardiovascular status: blood pressure returned to baseline and stable Postop Assessment: no apparent nausea or vomiting Anesthetic complications: no     Last Vitals:  Vitals:   12/10/17 1715 12/10/17 1725  BP: 120/76 121/82  Pulse: 60 (!) 50  Resp: 16 16  Temp:    SpO2: 97% 100%    Last Pain:  Vitals:   12/10/17 1658  TempSrc: Tympanic                 Priscila Bean S

## 2017-12-10 NOTE — Anesthesia Preprocedure Evaluation (Signed)
Anesthesia Evaluation  Patient identified by MRN, date of birth, ID band Patient awake    Reviewed: Allergy & Precautions, NPO status , Patient's Chart, lab work & pertinent test results  Airway Mallampati: II  TM Distance: >3 FB     Dental  (+) Chipped   Pulmonary sleep apnea and Continuous Positive Airway Pressure Ventilation , former smoker,    Pulmonary exam normal        Cardiovascular hypertension, Normal cardiovascular exam     Neuro/Psych negative neurological ROS  negative psych ROS   GI/Hepatic negative GI ROS, Neg liver ROS,   Endo/Other  negative endocrine ROS  Renal/GU negative Renal ROS  negative genitourinary   Musculoskeletal negative musculoskeletal ROS (+)   Abdominal Normal abdominal exam  (+)   Peds negative pediatric ROS (+)  Hematology negative hematology ROS (+)   Anesthesia Other Findings   Reproductive/Obstetrics                             Anesthesia Physical Anesthesia Plan  ASA: II  Anesthesia Plan: General   Post-op Pain Management:    Induction: Intravenous  PONV Risk Score and Plan:   Airway Management Planned: Nasal Cannula  Additional Equipment:   Intra-op Plan:   Post-operative Plan:   Informed Consent: I have reviewed the patients History and Physical, chart, labs and discussed the procedure including the risks, benefits and alternatives for the proposed anesthesia with the patient or authorized representative who has indicated his/her understanding and acceptance.   Dental advisory given  Plan Discussed with: CRNA and Surgeon  Anesthesia Plan Comments:         Anesthesia Quick Evaluation

## 2017-12-10 NOTE — Op Note (Signed)
[facility] Patient Name: Taylor Medina Procedure Date: 12/10/2017 3:39 PM MRN: [id number] Account #: [id number] Date of Birth: 1966/02/08 Admit Type: Outpatient Age: 52 Room: [facility] ENDO ROOM 3 Gender: Male Note Status: Finalized Procedure:            Colonoscopy Indications:          Screening for colorectal malignant neoplasm, Screening                        in patient at increased risk: Family history of                        1st-degree relative with colorectal cancer before age                        60 years Providers:            Keighan Amezcua B. Bonna Gains MD, MD Referring MD:         Rose Fillers. Jaynie Crumble, MD (Referring MD) Medicines:            Monitored Anesthesia Care Complications:        No immediate complications. Procedure:            Pre-Anesthesia Assessment:                       - ASA Grade Assessment: II - A patient with mild                        systemic disease.                       - Prior to the procedure, a History and Physical was                        performed, and patient medications, allergies and                        sensitivities were reviewed. The patient's tolerance of                        previous anesthesia was reviewed.                       - The risks and benefits of the procedure and the                        sedation options and risks were discussed with the                        patient. All questions were answered and informed                        consent was obtained.                       - Patient identification and proposed procedure were                        verified prior to the procedure by the physician, the  nurse, the anesthesiologist, the anesthetist and the                        technician. The procedure was verified in the procedure                        room.                       After obtaining informed consent, the colonoscope was                        passed  under direct vision. Throughout the procedure,                        the patient's blood pressure, pulse, and oxygen                        saturations were monitored continuously. The                        Colonoscope was introduced through the anus and                        advanced to the the cecum, identified by appendiceal                        orifice and ileocecal valve. The colonoscopy was                        performed with ease. The patient tolerated the                        procedure well. The quality of the bowel preparation                        was good. Findings:      The perianal exam findings include non-thrombosed external hemorrhoids.      A 3 mm polyp was found in the ascending colon. The polyp was sessile.       The polyp was removed with a cold snare. Resection and retrieval were       complete.      A 2 mm polyp was found in the descending colon. The polyp was sessile.       The polyp was removed with a cold biopsy forceps. Resection and       retrieval were complete.      The exam was otherwise without abnormality.      The rectum, sigmoid colon, descending colon, transverse colon, ascending       colon and cecum appeared normal.      The retroflexed view of the distal rectum and anal verge was normal and       showed no anal or rectal abnormalities.      Multiple small and large-mouthed diverticula were found in the sigmoid       colon, descending colon and ascending colon. They were highest in       quantity in the sigmoid colon compared to the rest of the colon. Impression:           - Non-thrombosed external hemorrhoids found on perianal  exam.                       - One 3 mm polyp in the ascending colon, removed with a                        cold snare. Resected and retrieved.                       - One 2 mm polyp in the descending colon, removed with                        a cold biopsy forceps. Resected and retrieved.                        - The examination was otherwise normal.                       - The rectum, sigmoid colon, descending colon,                        transverse colon, ascending colon and cecum are normal.                       - The distal rectum and anal verge are normal on                        retroflexion view.                       - Diverticulosis in the sigmoid colon, in the                        descending colon and in the ascending colon. Recommendation:       - Discharge patient to home (with escort).                       - Advance diet as tolerated.                       - Continue present medications.                       - Await pathology results.                       - Repeat colonoscopy in 5 years for surveillance.                       - The findings and recommendations were discussed with                        the patient.                       - The findings and recommendations were discussed with                        the patient's family.                       - Return to primary care physician as previously  scheduled.                       - High fiber diet. Procedure Code(s):    --- Professional ---                       [phone], Colonoscopy, flexible; with removal of tumor(s),                        polyp(s), or other lesion(s) by snare technique                       45380, 85, Colonoscopy, flexible; with biopsy, single                        or multiple Diagnosis Code(s):    --- Professional ---                       Z12.11, Encounter for screening for malignant neoplasm                        of colon                       Z80.0, Family history of malignant neoplasm of                        digestive organs                       D12.2, Benign neoplasm of ascending colon                       D12.4, Benign neoplasm of descending colon                       K64.4, Residual hemorrhoidal skin tags                       K57.30,  Diverticulosis of large intestine without                        perforation or abscess without bleeding CPT copyright 2016 American Medical Association. All rights reserved. The codes documented in this report are preliminary and upon coder review may  be revised to meet current compliance requirements.  Vonda Antigua, MD Margretta Sidle B. Bonna Gains MD, MD 12/10/2017 4:58:47 PM This report has been signed electronically. Number of Addenda: 0 Note Initiated On: 12/10/2017 3:39 PM Scope Withdrawal Time: 0 hours 34 minutes 11 seconds  Total Procedure Duration: 0 hours 44 minutes 32 seconds  Estimated Blood Loss: Estimated blood loss: none.      [facility]

## 2017-12-10 NOTE — Anesthesia Procedure Notes (Signed)
Date/Time: 12/10/2017 3:42 PM Performed by: Johnna Acosta, CRNA Pre-anesthesia Checklist: Patient identified, Emergency Drugs available, Suction available, Patient being monitored and Timeout performed Patient Re-evaluated:Patient Re-evaluated prior to induction Oxygen Delivery Method: Nasal cannula Preoxygenation: Pre-oxygenation with 100% oxygen

## 2017-12-10 NOTE — Transfer of Care (Signed)
Immediate Anesthesia Transfer of Care Note  Patient: Taylor Medina  Procedure(s) Performed: COLONOSCOPY WITH PROPOFOL (N/A ) ESOPHAGOGASTRODUODENOSCOPY (EGD) WITH PROPOFOL  Patient Location: Endoscopy Unit  Anesthesia Type:General  Level of Consciousness: awake  Airway & Oxygen Therapy: Patient Spontanous Breathing and Patient connected to nasal cannula oxygen  Post-op Assessment: Report given to RN and Post -op Vital signs reviewed and stable  Post vital signs: Reviewed  Last Vitals:  Vitals:   12/10/17 1600 12/10/17 1658  BP:  116/80  Pulse:  77  Resp:  16  Temp: (P) 36.6 C 36.7 C  SpO2:  99%    Last Pain:  Vitals:   12/10/17 1600  TempSrc: (P) Tympanic      Patients Stated Pain Goal: 0 (93/26/71 2458)  Complications: No apparent anesthesia complications

## 2017-12-10 NOTE — H&P (Signed)
Vonda Antigua, MD [address] [place], [place], [place], [address] [address], [place], [place], [place], [address] Phone: [phone]  Fax: [phone]  Primary Care Physician:  [name], [place]   Pre-Procedure History & Physical: HPI:  DYLAND PANUCO is a 52 y.o. male is here for a colonoscopy and EGD.   Past Medical History:  Diagnosis Date  . HTN (hypertension)    episodic   . Palpitations     Past Surgical History:  Procedure Laterality Date  . FRACTURE SURGERY    . hardware fractions     on R; several surgeries   . hernia repair surgery      Prior to Admission medications   Medication Sig Start Date End Date Taking? Authorizing Provider  amoxicillin (AMOXIL) 875 MG tablet Take 1 tablet (875 mg total) by mouth 2 (two) times daily. 11/19/17  Yes [name], PA  butalbital-acetaminophen-caffeine (FIORICET, ESGIC) [phone] MG tablet Take 1-2 tablets by mouth every 6 (six) hours as needed for headache. 05/18/17 05/18/18 Yes Birdie Sons, MD  diazepam (VALIUM) 5 MG tablet One every 8 hours as needed for chest discomfort/stress 11/02/17  Yes [name], PA  fluticasone (FLONASE) 50 MCG/ACT nasal spray Place 1 spray into the nose daily. Reported on 02/29/2016   Yes [provider]  hydrALAZINE (APRESOLINE) 10 MG tablet Take 10 mg by mouth 3 (three) times daily as needed.     Yes [provider]  ibuprofen (ADVIL,MOTRIN) 200 MG tablet Take 400 mg by mouth every 6 (six) hours as needed.   Yes [provider]  ranitidine (ZANTAC) 150 MG capsule Take 150 mg by mouth 2 (two) times daily.   Yes [provider]  sildenafil (REVATIO) 20 MG tablet Take 3 to 5 tablets two hours before intercouse on an empty stomach.  Do not take with nitrates. 07/28/16  Yes McGowan, Larene Beach A, PA-C  sildenafil (VIAGRA) 100 MG tablet Take 1 tablet (100 mg total) by mouth daily as needed for erectile dysfunction. Take two hours prior to intercourse on  an empty stomach 07/28/16 07/28/16  Zara Council A, PA-C    Allergies as of 11/09/2017  . (No Active Allergies)    Family History  Problem Relation Age of Onset  . Cancer Unknown        family hx  . Coronary artery disease Unknown        family hx  . Diabetes Unknown        family hx    Social History   Socioeconomic History  . Marital status: Married    Spouse name: Not on file  . Number of children: Not on file  . Years of education: Not on file  . Highest education level: Not on file  Social Needs  . Financial resource strain: Not on file  . Food insecurity - worry: Not on file  . Food insecurity - inability: Not on file  . Transportation needs - medical: Not on file  . Transportation needs - non-medical: Not on file  Occupational History  . Not on file  Tobacco Use  . Smoking status: Former Smoker    Last attempt to quit: 07/28/2013    Years since quitting: 4.3  . Smokeless tobacco: Never Used  Substance and Sexual Activity  . Alcohol use: Yes    Alcohol/week: 1.8 oz    Types: 3 Glasses of wine per week  . Drug use: No  . Sexual activity: Not on file  Other  Topics Concern  . Not on file  Social History Narrative   Single; full time; does not get regular exercise.     Review of Systems: See HPI, otherwise negative ROS  Physical Exam: BP (!) 137/91   Pulse 78   Temp (!) 96.4 F (35.8 C) (Tympanic)   Resp 16   Ht 5\' 9"  (1.753 m)   Wt 204 lb (92.5 kg)   SpO2 96%   BMI 30.13 kg/m  General:   Alert,  pleasant and cooperative in NAD Head:  Normocephalic and atraumatic. Neck:  Supple; no masses or thyromegaly. Lungs:  Clear throughout to auscultation, normal respiratory effort.    Heart:  +S1, +S2, Regular rate and rhythm, No edema. Abdomen:  Soft, nontender and nondistended. Normal bowel sounds, without guarding, and without rebound.   Neurologic:  Alert and  oriented x4;  grossly normal neurologically.  Impression/Plan: MARIANA GOYTIA is here  for a colonoscopy to be performed for high risk screening and EGD for Acid Reflux and dysphagia.  Risks, benefits, limitations, and alternatives regarding the procedures have been reviewed with the patient.  Questions have been answered.  All parties agreeable.   Virgel Manifold, MD  12/10/2017, 3:36 PM

## 2017-12-11 ENCOUNTER — Encounter: Payer: Self-pay | Admitting: Gastroenterology

## 2017-12-15 ENCOUNTER — Other Ambulatory Visit: Payer: Self-pay | Admitting: Family Medicine

## 2017-12-15 LAB — SURGICAL PATHOLOGY

## 2017-12-16 MED ORDER — BUTALBITAL-APAP-CAFFEINE 50-325-40 MG PO TABS: 1.0000 | ORAL_TABLET | Freq: Four times a day (QID) | ORAL | 2 refills | Status: AC | PRN

## 2017-12-16 NOTE — Telephone Encounter (Signed)
Please see refill request.

## 2017-12-23 ENCOUNTER — Encounter: Payer: Self-pay | Admitting: Gastroenterology

## 2018-01-18 ENCOUNTER — Ambulatory Visit (INDEPENDENT_AMBULATORY_CARE_PROVIDER_SITE_OTHER): Payer: 59 | Admitting: Gastroenterology

## 2018-01-18 ENCOUNTER — Other Ambulatory Visit: Payer: Self-pay

## 2018-01-18 ENCOUNTER — Encounter: Payer: Self-pay | Admitting: Gastroenterology

## 2018-01-18 ENCOUNTER — Ambulatory Visit: Payer: 59 | Admitting: Gastroenterology

## 2018-01-18 VITALS — BP 129/85 | HR 73 | Temp 98.0°F | Ht 69.0 in | Wt 196.6 lb

## 2018-01-18 DIAGNOSIS — K209 Esophagitis, unspecified without bleeding: Secondary | ICD-10-CM

## 2018-01-18 DIAGNOSIS — Z86010 Personal history of colonic polyps: Secondary | ICD-10-CM

## 2018-01-18 DIAGNOSIS — K5730 Diverticulosis of large intestine without perforation or abscess without bleeding: Secondary | ICD-10-CM | POA: Diagnosis not present

## 2018-01-18 MED ORDER — OMEPRAZOLE 20 MG PO CPDR: 20.0000 mg | DELAYED_RELEASE_CAPSULE | Freq: Every day | ORAL | 0 refills | Status: DC

## 2018-01-18 NOTE — Patient Instructions (Signed)
F/U 6 months Continue Prilosec x1 month, then stop. Contact office if symptoms reoccur.

## 2018-01-18 NOTE — Progress Notes (Signed)
Vonda Antigua, MD [address]  [place]  [place], [place] [address]  Main: [phone]  Fax: [phone]   Primary Care Physician: [name], [place]  Primary Gastroenterologist:  Dr. Vonda Antigua  Chief Complaint  Patient presents with  . Follow-up    3 month f/u colonoscopy/EGD on 12/10/2017    HPI: Taylor Medina is a 52 y.o. male here for follow-up for bright red blood per rectum, and pill dysphagia.  Patient was initially seen in office consultation on October 19, 2017 for bright red blood per rectum.  At that time had 3-39-month history of the same, with bright red blood occurring every day to every other day for 3-4 months.  Patient did not have any abdominal pain or nausea vomiting or weight loss.  He underwent colonoscopy on December 10, 2017, for bright red blood per rectum, and high risk screening (family history of colon cancer diagnosed in brother at 48 years of age) 2 tubular adenomas were removed, diverticulosis were seen.  Repeat recommended in 5 years  He also underwent EGD on the same day, due to pill dysphagia.  He did not have any dysphagia to solid foods.  EGD showed suggestive features of eosinophilic esophagitis, grade B reflux esophagitis, biopsies did not show any eosinophils.  Patient was on on PPI at the time of the EGD.  Duodenal bulb, and stomach erythema was seen.  Biopsies of both did not show any significant abnormalities, and no H. Pylori.  Patient has been taking Prilosec 20 mg every morning since December 10, 2017.  Denies any dysphagia to pills, solids, liquids.  No abdominal pain.  No nausea vomiting.  No weight loss.  No blood in stool.  Normal appetite.  He states he feels well.  Patient has had history of diverticulitis in the past in 2016, which was diagnosed with CT at the time as per patient..   Current Outpatient Medications  Medication Sig Dispense Refill  . butalbital-acetaminophen-caffeine (FIORICET, ESGIC) 50-325-40 MG  tablet Take 1-2 tablets by mouth every 6 (six) hours as needed for headache. 20 tablet 2  . diazepam (VALIUM) 5 MG tablet One every 8 hours as needed for chest discomfort/stress 30 tablet 0  . fluticasone (FLONASE) 50 MCG/ACT nasal spray Place 1 spray into the nose daily. Reported on 02/29/2016    . hydrALAZINE (APRESOLINE) 10 MG tablet Take 10 mg by mouth 3 (three) times daily as needed.      Marland Kitchen ibuprofen (ADVIL,MOTRIN) 200 MG tablet Take 400 mg by mouth every 6 (six) hours as needed.    Marland Kitchen omeprazole (PRILOSEC) 20 MG capsule Take 1 capsule (20 mg total) by mouth daily. 30 capsule 1  . ranitidine (ZANTAC) 150 MG capsule Take 150 mg by mouth 2 (two) times daily.    . sildenafil (REVATIO) 20 MG tablet Take 3 to 5 tablets two hours before intercouse on an empty stomach.  Do not take with nitrates. (Patient not taking: Reported on 01/18/2018) 50 tablet 3   No current facility-administered medications for this visit.     Allergies as of 01/18/2018  . (No Known Allergies)    ROS:  General: Negative for anorexia, weight loss, fever, chills, fatigue, weakness. ENT: Negative for hoarseness, difficulty swallowing , nasal congestion. CV: Negative for chest pain, angina, palpitations, dyspnea on exertion, peripheral edema.  Respiratory: Negative for dyspnea at rest, dyspnea on exertion, cough, sputum, wheezing.  GI: See history of present illness. GU:  Negative for dysuria, hematuria, urinary incontinence,  urinary frequency, nocturnal urination.  Endo: Negative for unusual weight change.    Physical Examination:   BP 129/85   Pulse 73   Temp 98 F (36.7 C) (Oral)   Ht 5\' 9"  (1.753 m)   Wt 196 lb 9.6 oz (89.2 kg)   BMI 29.03 kg/m   General: Well-nourished, well-developed in no acute distress.  Eyes: No icterus. Conjunctivae pink. Mouth: Oropharyngeal mucosa moist and pink , no lesions erythema or exudate. Neck: Supple, Trachea midline Abdomen: Bowel sounds are normal, nontender, nondistended,  no hepatosplenomegaly or masses, no abdominal bruits or hernia , no rebound or guarding.   Extremities: No lower extremity edema. No clubbing or deformities. Neuro: Alert and oriented x 3.  Grossly intact. Skin: Warm and dry, no jaundice.   Psych: Alert and cooperative, normal mood and affect.   Labs: CMP     Component Value Date/Time   NA 138 03/03/2016 1112   NA 137 01/31/2015 1638   K 4.9 03/03/2016 1112   K 4.5 01/31/2015 1638   CL 97 03/03/2016 1112   CL 101 01/31/2015 1638   CO2 20 03/03/2016 1112   CO2 25 01/31/2015 1638   GLUCOSE 88 03/03/2016 1112   GLUCOSE 119 (H) 01/31/2015 1638   BUN 12 03/03/2016 1112   BUN 15 01/31/2015 1638   CREATININE 1.02 03/03/2016 1112   CREATININE 1.18 01/31/2015 1638   CALCIUM 9.2 03/03/2016 1112   CALCIUM 9.8 01/31/2015 1638   PROT 7.1 03/03/2016 1112   PROT 8.1 01/31/2015 1638   ALBUMIN 4.4 03/03/2016 1112   ALBUMIN 4.6 01/31/2015 1638   AST 50 (H) 03/03/2016 1112   AST 84 (H) 01/31/2015 1638   ALT 121 (H) 03/03/2016 1112   ALT 181 (H) 01/31/2015 1638   ALKPHOS 70 03/03/2016 1112   ALKPHOS 66 01/31/2015 1638   BILITOT 0.4 03/03/2016 1112   BILITOT 0.7 01/31/2015 1638   GFRNONAA 87 03/03/2016 1112   GFRNONAA >60 01/31/2015 1638   GFRAA 101 03/03/2016 1112   GFRAA >60 01/31/2015 1638   Lab Results  Component Value Date   WBC 7.1 10/16/2017   HGB 16.7 10/16/2017   HCT 48.0 10/16/2017   MCV 92 10/16/2017   PLT 228 10/16/2017    Imaging Studies: No results found.  Assessment and Plan:   Taylor Medina is a 52 y.o. y/o male here for follow-up, with history of diverticulitis in 2016/resolved, history of bright red blood per rectum/resolved, pill dysphagia/resolved, grade B esophagitis  -Grade B esophagitis No further symptoms of heartburn Symptoms of dysphagia have completely resolved Patient educated extensively on acid reflux lifestyle modification, including buying a bed wedge, not eating 3 hrs before bedtime, diet  modifications, and handout given for the same.  We will continue PPI once daily for another month, which would be a total of 2 months.  And then patient was asked to stop the medication completely.  this would help treat the esophagitis, and prevent side effects from long-term medication use.  In addition, he has instituted acid reflux lifestyle modifications, which should help keep symptoms controlled without medications. If symptoms recur, he was asked to call us, and then we can consider reinstituting PPI, or using Zantac once or twice daily I will follow-up with him in 6 months, and assess symptoms, and repeat EGD at that time Since esophagitis did not cross mucosal folds, as it was only grade B, unlikely to have underlying Barrett's underneath the grade B esophagitis at this time.  No  indication for urgent endoscopy at this time, since symptoms have resolved.  -Tubular adenoma polyps Repeat indicated in 5 years, February 2024 Also has family history of colon cancer  -Diverticulosis and history of diverticulitis High-fiber diet encouraged Patient is maintaining 1-2 soft bowel movements daily  (Risks of PPI use were discussed with patient including bone loss, C. Diff diarrhea, pneumonia, infections, CKD, electrolyte abnormalities.  If clinically possible based on symptoms, goal would be to maintain patient on the lowest dose possible, or discontinue the medication with institution of acid reflux lifestyle modifications over time. Pt. Verbalizes understanding and chooses to continue the medication.)  Dr Vonda Antigua

## 2018-01-27 ENCOUNTER — Encounter: Payer: Self-pay | Admitting: Urology

## 2018-01-27 ENCOUNTER — Ambulatory Visit (INDEPENDENT_AMBULATORY_CARE_PROVIDER_SITE_OTHER): Payer: 59 | Admitting: Urology

## 2018-01-27 ENCOUNTER — Ambulatory Visit: Payer: 59 | Admitting: Urology

## 2018-01-27 VITALS — BP 152/93 | HR 77 | Resp 16 | Ht 69.0 in | Wt 195.8 lb

## 2018-01-27 DIAGNOSIS — N5201 Erectile dysfunction due to arterial insufficiency: Secondary | ICD-10-CM

## 2018-01-27 MED ORDER — TADALAFIL 20 MG PO TABS: 20.0000 mg | ORAL_TABLET | Freq: Every day | ORAL | 10 refills | Status: DC | PRN

## 2018-01-27 NOTE — Progress Notes (Signed)
01/27/2018 1:08 PM   [name] 1966-03-07 [id number]  Referring provider: [name], [address] [place], [place] [address]  Chief Complaint  Patient presents with  . Follow-up    HPI: 52 year old male followed for erectile dysfunction.  He saw Zara Council last year and presents today for medication refill.  He has been using sildenafil and does complain of flushing, headache and decreased duration as compared to Cialis which she was previously taking but it became cost prohibitive.  Now that generic Cialis is available at he request going back to this medication.  He has no bothersome lower urinary tract symptoms.  He has not had a recent PSA on chart review today.   PMH: Past Medical History:  Diagnosis Date  . HTN (hypertension)    episodic   . Palpitations     Surgical History: Past Surgical History:  Procedure Laterality Date  . COLONOSCOPY WITH PROPOFOL N/A 12/10/2017   Procedure: COLONOSCOPY WITH PROPOFOL;  Surgeon: Virgel Manifold, MD;  Location: [facility] ENDOSCOPY;  Service: Endoscopy;  Laterality: N/A;  . ESOPHAGOGASTRODUODENOSCOPY (EGD) WITH PROPOFOL  12/10/2017   Procedure: ESOPHAGOGASTRODUODENOSCOPY (EGD) WITH PROPOFOL;  Surgeon: Virgel Manifold, MD;  Location: [facility] ENDOSCOPY;  Service: Endoscopy;;  . FRACTURE SURGERY    . hardware fractions     on R; several surgeries   . hernia repair surgery      Home Medications:  Allergies as of 01/27/2018   No Known Allergies     Medication List        Accurate as of 01/27/18  1:08 PM. Always use your most recent med list.          butalbital-acetaminophen-caffeine 50-325-40 MG tablet Commonly known as:  FIORICET, ESGIC Take 1-2 tablets by mouth every 6 (six) hours as needed for headache.   diazepam 5 MG tablet Commonly known as:  VALIUM One every 8 hours as needed for chest discomfort/stress   FLONASE 50 MCG/ACT nasal spray Generic drug:  fluticasone Place 1 spray  into the nose daily. Reported on 02/29/2016   hydrALAZINE 10 MG tablet Commonly known as:  APRESOLINE Take 10 mg by mouth 3 (three) times daily as needed.   ibuprofen 200 MG tablet Commonly known as:  ADVIL,MOTRIN Take 400 mg by mouth every 6 (six) hours as needed.   omeprazole 20 MG capsule Commonly known as:  PRILOSEC Take 1 capsule (20 mg total) by mouth daily.   ranitidine 150 MG capsule Commonly known as:  ZANTAC Take 150 mg by mouth 2 (two) times daily.   sildenafil 20 MG tablet Commonly known as:  REVATIO Take 3 to 5 tablets two hours before intercouse on an empty stomach.  Do not take with nitrates.       Allergies: No Known Allergies  Family History: Family History  Problem Relation Age of Onset  . Cancer Unknown        family hx  . Coronary artery disease Unknown        family hx  . Diabetes Unknown        family hx    Social History:  reports that he quit smoking about 4 years ago. He has never used smokeless tobacco. He reports that he drinks about 1.8 oz of alcohol per week. He reports that he does not use drugs.  ROS: UROLOGY Frequent Urination?: No Hard to postpone urination?: No Burning/pain with urination?: No Get up at night to urinate?: No Leakage of urine?: Yes Urine  stream starts and stops?: No Trouble starting stream?: No Do you have to strain to urinate?: No Blood in urine?: No Urinary tract infection?: No Sexually transmitted disease?: No Injury to kidneys or bladder?: No Painful intercourse?: No Weak stream?: No Erection problems?: Yes Penile pain?: No  Gastrointestinal Nausea?: No Vomiting?: No Indigestion/heartburn?: Yes Diarrhea?: No Constipation?: No  Constitutional Fever: No Night sweats?: No Weight loss?: No Fatigue?: No  Skin Skin rash/lesions?: No Itching?: No  Eyes Blurred vision?: No Double vision?: No  Ears/Nose/Throat Sore throat?: No Sinus problems?: Yes  Hematologic/Lymphatic Swollen glands?:  No Easy bruising?: No  Cardiovascular Leg swelling?: No Chest pain?: No  Respiratory Cough?: No Shortness of breath?: No  Endocrine Excessive thirst?: No  Musculoskeletal Back pain?: No Joint pain?: No  Neurological Headaches?: Yes Dizziness?: No  Psychologic Depression?: No Anxiety?: No  Physical Exam: BP (!) 152/93   Pulse 77   Resp 16   Ht 5\' 9"  (1.753 m)   Wt 195 lb 12.8 oz (88.8 kg)   SpO2 98%   BMI 28.91 kg/m   Constitutional:  Alert and oriented, No acute distress. HEENT: [place] AT, moist mucus membranes.  Trachea midline, no masses. Cardiovascular: No clubbing, cyanosis, or edema. Respiratory: Normal respiratory effort, no increased work of breathing. Skin: No rashes, bruises or suspicious lesions. Neurologic: Grossly intact, no focal deficits, moving all 4 extremities. Psychiatric: Normal mood and affect.  Laboratory Data: Lab Results  Component Value Date   WBC 7.1 10/16/2017   HGB 16.7 10/16/2017   HCT 48.0 10/16/2017   MCV 92 10/16/2017   PLT 228 10/16/2017    Lab Results  Component Value Date   CREATININE 1.02 03/03/2016   Lab Results  Component Value Date   TESTOSTERONE 305 08/08/2016     Assessment & Plan:   52 year old male with erectile dysfunction.  He wanted to restart Cialis and Rx was sent to his pharmacy.  Continue annual follow-up.  I did discuss the pros and cons of PSA testing and reviewed the American U[employer]ic Association recommendations.  He wanted to think this over and indicated he would request at his next annual physical with his PCP.    Abbie Sons, [place] [address], [place], [place] [address] [phone]

## 2018-02-02 ENCOUNTER — Other Ambulatory Visit: Payer: Self-pay | Admitting: Family Medicine

## 2018-02-02 ENCOUNTER — Telehealth: Payer: Self-pay | Admitting: Family Medicine

## 2018-02-02 DIAGNOSIS — F419 Anxiety disorder, unspecified: Secondary | ICD-10-CM

## 2018-02-02 MED ORDER — DIAZEPAM 5 MG PO TABS: ORAL_TABLET | ORAL | 1 refills | Status: DC

## 2018-02-02 NOTE — Telephone Encounter (Signed)
Have refilled diazepam. Discussed with patient prn use of hydralazine instead of a once daily medication. He will come in for an office visit to for bp evaluation.

## 2018-02-02 NOTE — Telephone Encounter (Signed)
Please review, Valium was last filled 11/02/17 and Hydralazine 04/28/11. KW

## 2018-02-02 NOTE — Telephone Encounter (Signed)
[facility] faxed a refill request for the following medications. Thanks CC  diazepam (VALIUM) 5 MG tablet   hydrALAZINE (APRESOLINE) 10 MG tablet

## 2018-02-21 IMAGING — CR DG CHEST 2V
1 series · 2 of 2 positions shown · non-contrast
Comparison: December 29, 2008 and December 05, 2009

CLINICAL DATA: Left-sided chest pain for few weeks.

EXAM:
CHEST  2 VIEW

[Series 1: dg chest 2 view · 0.14mm/px · 2 of 2 slices shown]
[im 1/2]
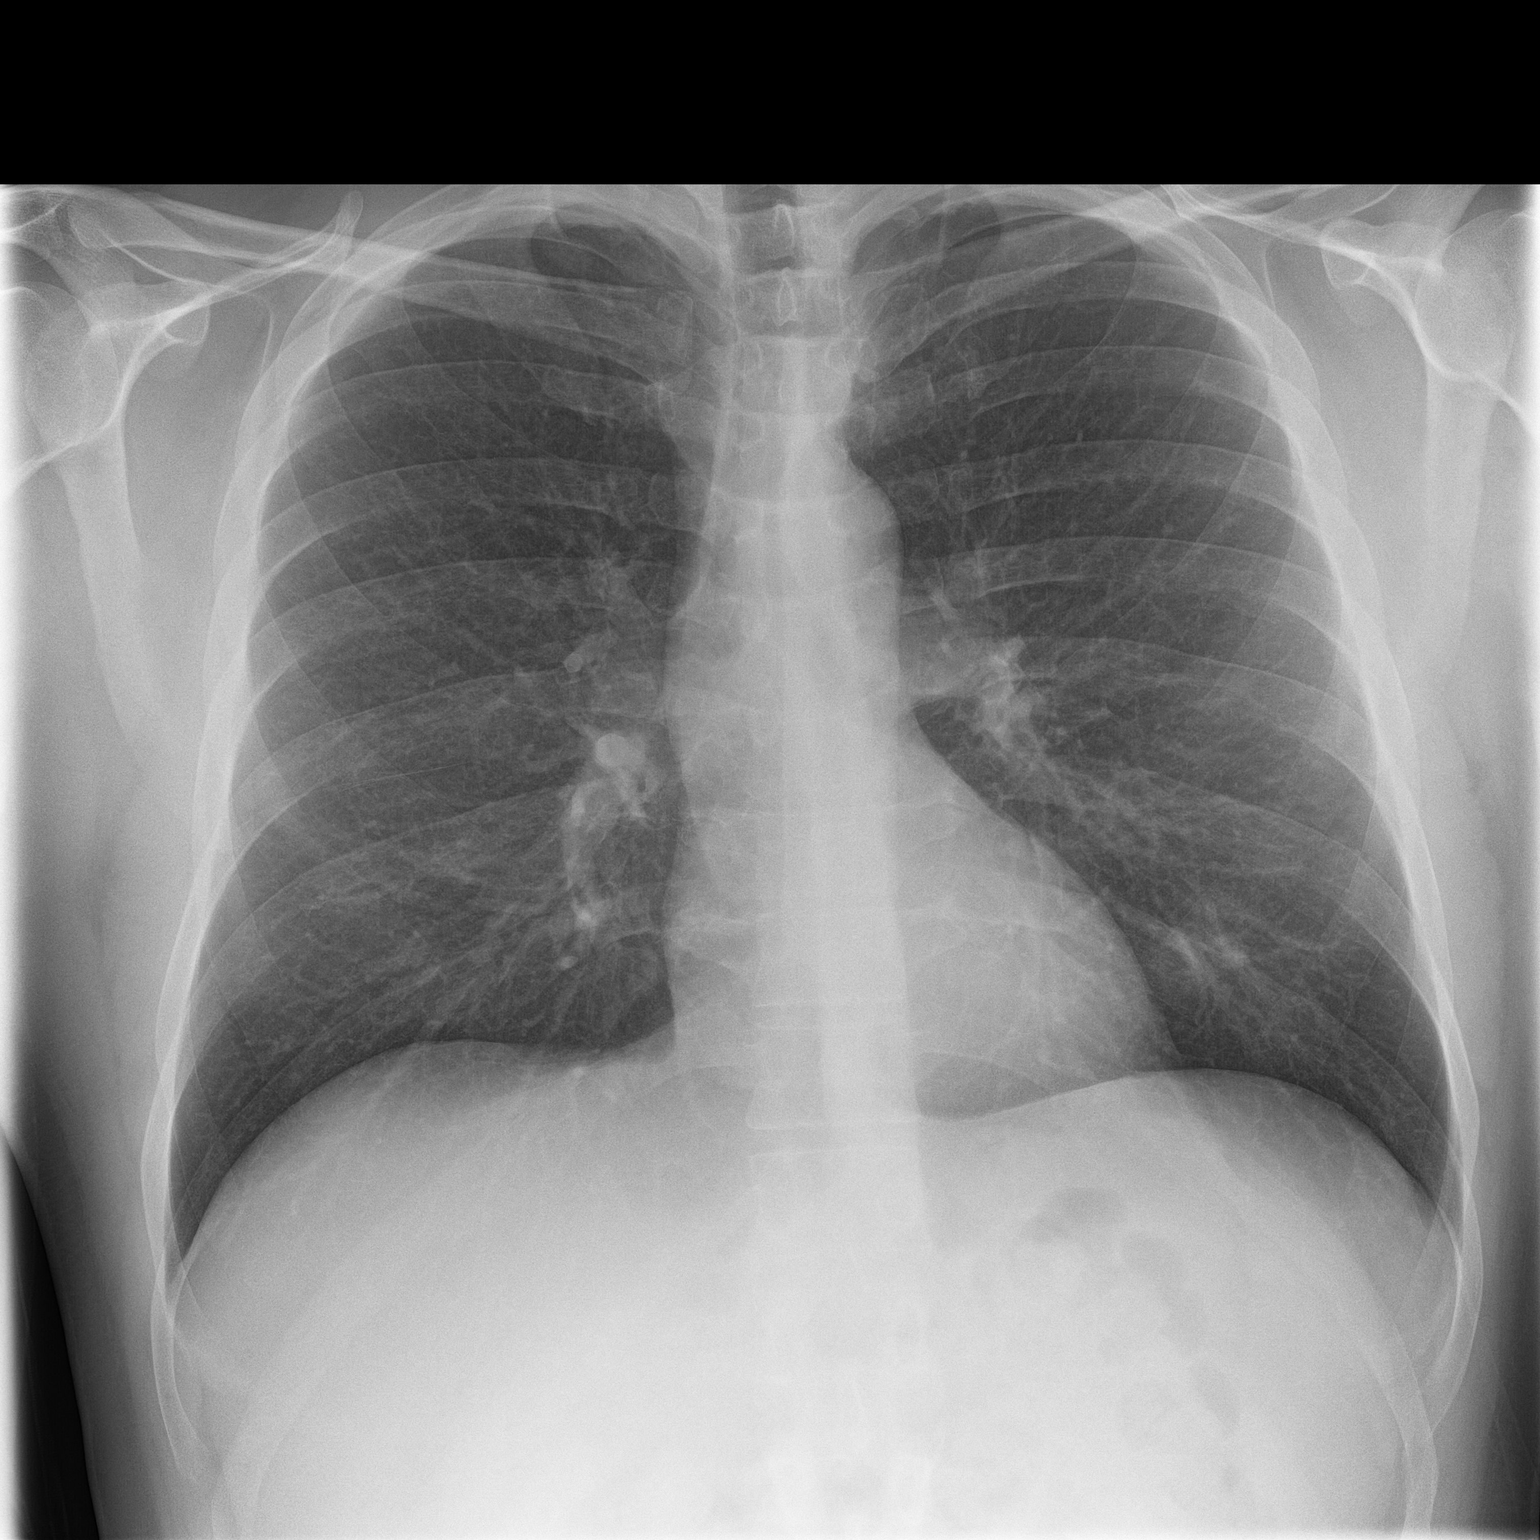
[im 2/2]
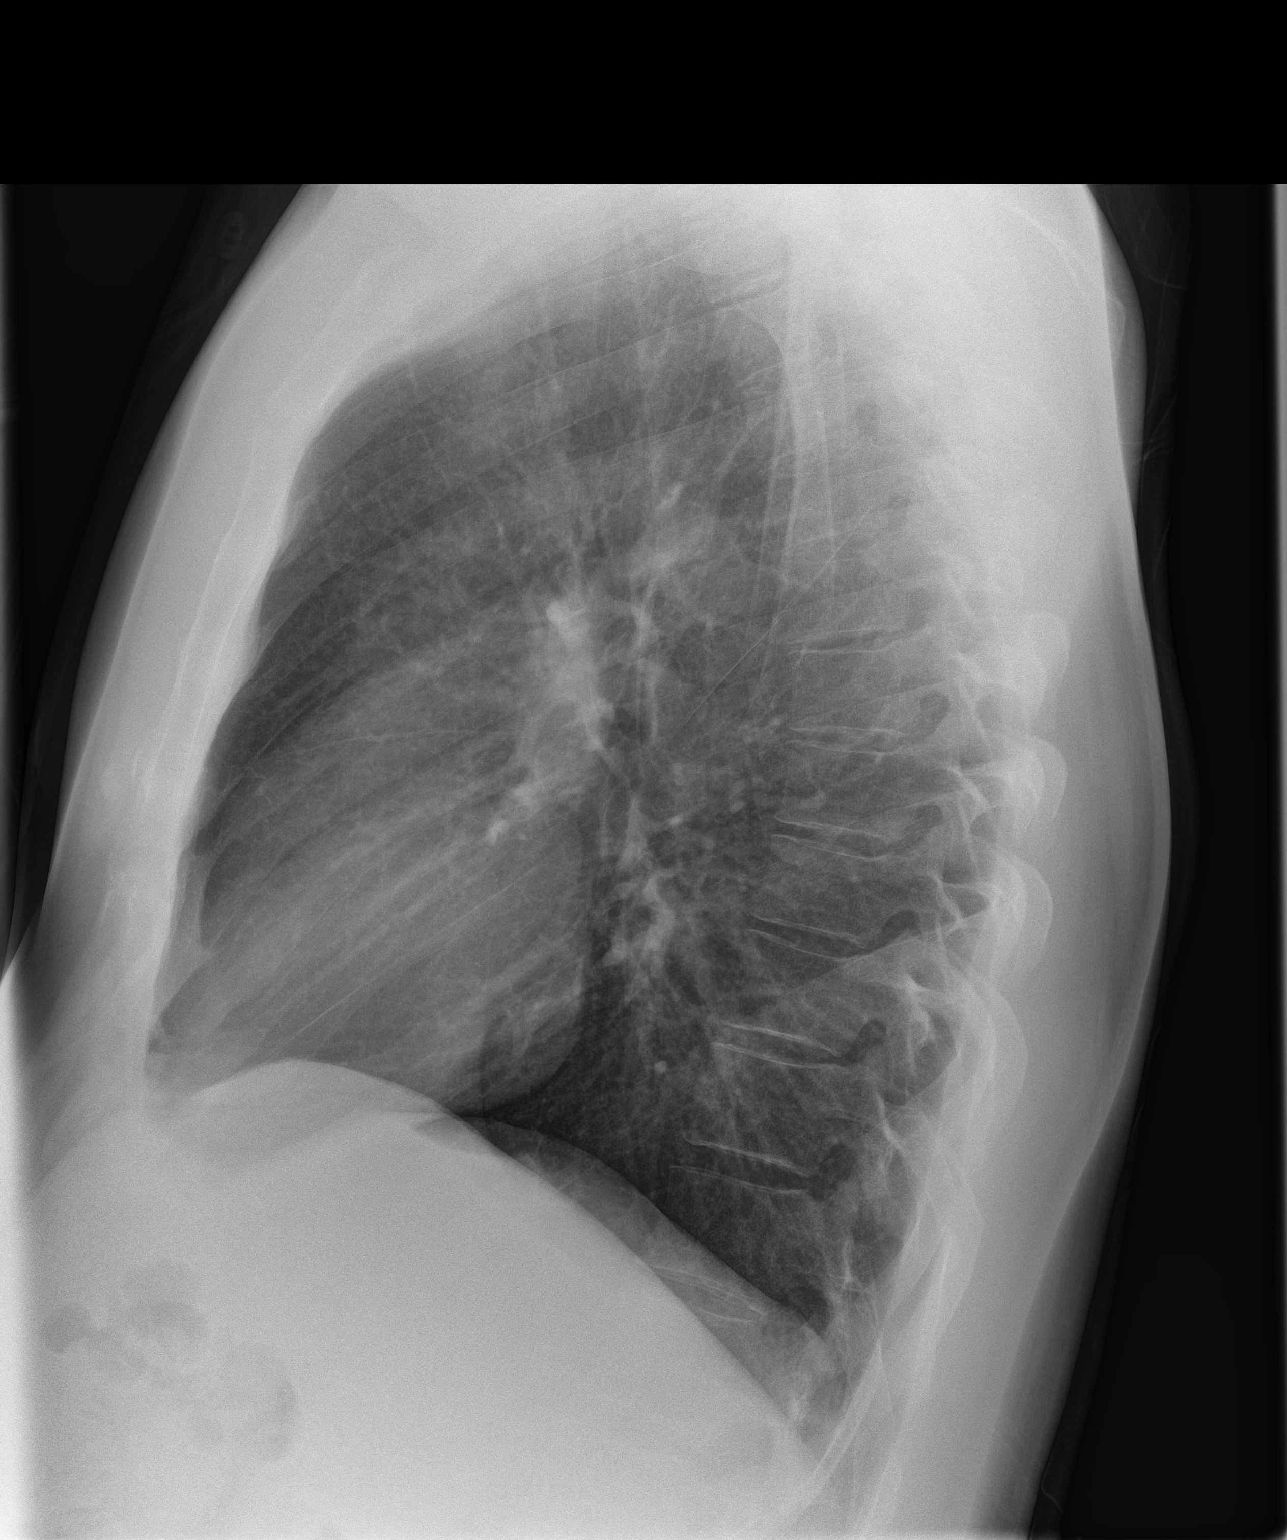

[2 of 2 positions shown; findings below may reference images not displayed]

FINDINGS: There is a mild opacity in the left lung base with somewhat nodular
components, not seen on previous studies. The heart, hila, and
mediastinum are normal. No other acute abnormalities.
IMPRESSION: Mild opacity in left lung base with somewhat nodular components.
Recommend short-term follow-up to ensure resolution.

## 2018-02-25 DIAGNOSIS — G4733 Obstructive sleep apnea (adult) (pediatric): Secondary | ICD-10-CM | POA: Diagnosis not present

## 2018-05-10 ENCOUNTER — Ambulatory Visit (INDEPENDENT_AMBULATORY_CARE_PROVIDER_SITE_OTHER): Payer: 59 | Admitting: Family Medicine

## 2018-05-10 ENCOUNTER — Encounter: Payer: Self-pay | Admitting: Family Medicine

## 2018-05-10 VITALS — BP 150/102 | HR 60 | Temp 98.4°F | Resp 16 | Wt 199.2 lb

## 2018-05-10 DIAGNOSIS — E781 Pure hyperglyceridemia: Secondary | ICD-10-CM

## 2018-05-10 DIAGNOSIS — J0110 Acute frontal sinusitis, unspecified: Secondary | ICD-10-CM

## 2018-05-10 MED ORDER — DOXYCYCLINE HYCLATE 100 MG PO TABS
100.0000 mg | ORAL_TABLET | Freq: Two times a day (BID) | ORAL | 0 refills | Status: DC
Start: 2018-05-10 — End: 2019-03-31

## 2018-05-10 MED ORDER — HYDROCODONE-HOMATROPINE 5-1.5 MG/5ML PO SYRP: 5.0000 mL | ORAL_SOLUTION | Freq: Four times a day (QID) | ORAL | 0 refills | Status: DC | PRN

## 2018-05-10 NOTE — Patient Instructions (Signed)
Stop the decongestant and use plain expectorant like Mucinex. Do come in for a nurse bp check when you feel better.

## 2018-05-10 NOTE — Progress Notes (Signed)
  Subjective:     Patient ID: Taylor Medina, male   DOB: 1966-06-19, 52 y.o.   MRN: [id number] Chief Complaint  Patient presents with  . Cough    Patient comes in office today with concerns of cough and congestion for the past 3 weeks. Patient states that symptoms started off as a "head cold" with sinus pain on right side of face. Patient reports shortness of breath and running a fever high of 100.1. Patient has tried otc Mucinex D, Nyquil/Dayquil and Ibuprofen PRN.    HPI Patient reports increased sinus pressure, purulent sinus drainage, post nasal drainage and accompanying cough. Currently not taking any antihypertensives.  Review of Systems     Objective:   Physical Exam  Constitutional: He appears well-developed and well-nourished. No distress.  Ears: T.M's intact without inflammation Sinuses: mild right frontal sinus tenderness Throat: no tonsillar enlargement or exudate Neck: bilateral anterior cervical nodes Lungs: Left posterior base with expiratory wheeze/coarse crackles.     Assessment:    1. Acute frontal sinusitis, recurrence not specified - doxycycline (VIBRA-TABS) 100 MG tablet; Take 1 tablet (100 mg total) by mouth 2 (two) times daily.  Dispense: 20 tablet; Refill: 0    Plan:    Stop decongestants but stay on expectorants. Come in for a nurse bp check when better.

## 2018-05-14 ENCOUNTER — Encounter: Payer: Self-pay | Admitting: Family Medicine

## 2018-10-04 DIAGNOSIS — M722 Plantar fascial fibromatosis: Secondary | ICD-10-CM | POA: Diagnosis not present

## 2018-10-04 DIAGNOSIS — S93312A Subluxation of tarsal joint of left foot, initial encounter: Secondary | ICD-10-CM | POA: Diagnosis not present

## 2018-11-02 DIAGNOSIS — G8929 Other chronic pain: Secondary | ICD-10-CM | POA: Diagnosis not present

## 2018-11-02 DIAGNOSIS — M25561 Pain in right knee: Secondary | ICD-10-CM | POA: Diagnosis not present

## 2018-11-02 DIAGNOSIS — M1731 Unilateral post-traumatic osteoarthritis, right knee: Secondary | ICD-10-CM | POA: Diagnosis not present

## 2018-12-30 ENCOUNTER — Ambulatory Visit (INDEPENDENT_AMBULATORY_CARE_PROVIDER_SITE_OTHER): Payer: 59 | Admitting: Family Medicine

## 2018-12-30 ENCOUNTER — Encounter: Payer: Self-pay | Admitting: Family Medicine

## 2018-12-30 ENCOUNTER — Other Ambulatory Visit: Payer: Self-pay

## 2018-12-30 VITALS — BP 134/80 | HR 80 | Temp 98.7°F | Resp 16 | Wt 198.0 lb

## 2018-12-30 DIAGNOSIS — J321 Chronic frontal sinusitis: Secondary | ICD-10-CM | POA: Diagnosis not present

## 2018-12-30 MED ORDER — AMOXICILLIN 500 MG PO CAPS: 1000.0000 mg | ORAL_CAPSULE | Freq: Two times a day (BID) | ORAL | 0 refills | Status: AC

## 2018-12-30 NOTE — Progress Notes (Signed)
Patient: Taylor Medina Male    DOB: 02-04-1966   53 y.o.   MRN: [id number] Visit Date: 12/30/2018  Today's Provider: Lelon Huh, MD   Chief Complaint  Patient presents with  . URI    Started about a week ago.   Subjective:     URI   This is a new problem. The current episode started in the past 7 days (Started 12/23/2018). The problem has been gradually worsening. The maximum temperature recorded prior to his arrival was 100.4 - 100.9 F. Associated symptoms include congestion, coughing, rhinorrhea, sinus pain, sneezing, swollen glands and wheezing. Pertinent negatives include no abdominal pain, ear pain, headaches, nausea, sore throat or vomiting. He has tried decongestant for the symptoms. The treatment provided mild relief.   Denies any recent travel to areas with endemic coronavirus or travels on cruise ship or known contact with anyone diagnosed with coronavirus.    No Known Allergies   Current Outpatient Medications:  .  diazepam (VALIUM) 5 MG tablet, One every 8 hours as needed for chest discomfort/stress, Disp: 30 tablet, Rfl: 1 .  fluticasone (FLONASE) 50 MCG/ACT nasal spray, Place 1 spray into the nose daily. Reported on 02/29/2016, Disp: , Rfl:  .  hydrALAZINE (APRESOLINE) 10 MG tablet, Take 10 mg by mouth 3 (three) times daily as needed.  , Disp: , Rfl:  .  ibuprofen (ADVIL,MOTRIN) 200 MG tablet, Take 400 mg by mouth every 6 (six) hours as needed., Disp: , Rfl:  .  tadalafil (CIALIS) 20 MG tablet, Take 1 tablet (20 mg total) by mouth daily as needed for erectile dysfunction., Disp: 6 tablet, Rfl: 10 .  doxycycline (VIBRA-TABS) 100 MG tablet, Take 1 tablet (100 mg total) by mouth 2 (two) times daily., Disp: 20 tablet, Rfl: 0 .  ranitidine (ZANTAC) 150 MG capsule, Take 150 mg by mouth 2 (two) times daily., Disp: , Rfl:   Review of Systems  Constitutional: Positive for chills, fatigue and fever. Negative for appetite change, diaphoresis and unexpected weight change.   HENT: Positive for congestion, rhinorrhea, sinus pressure, sinus pain and sneezing. Negative for ear discharge, ear pain, postnasal drip, sore throat and trouble swallowing.   Eyes: Positive for discharge and itching. Negative for photophobia, pain and redness.  Respiratory: Positive for cough and wheezing. Negative for apnea, choking, chest tightness, shortness of breath and stridor.   Gastrointestinal: Negative.  Negative for abdominal pain, nausea and vomiting.  Neurological: Negative for dizziness, light-headedness and headaches.    Social History   Tobacco Use  . Smoking status: Former Smoker    Last attempt to quit: 07/28/2013    Years since quitting: 5.4  . Smokeless tobacco: Never Used  Substance Use Topics  . Alcohol use: Yes    Alcohol/week: 3.0 standard drinks    Types: 3 Glasses of wine per week      Objective:   BP 134/80 (BP Location: Right Arm, Patient Position: Sitting, Cuff Size: Large)   Pulse 80   Temp 98.7 F (37.1 C) (Oral)   Resp 16   Wt 198 lb (89.8 kg)   BMI 29.24 kg/m  Vitals:   12/30/18 0928  BP: 134/80  Pulse: 80  Resp: 16  Temp: 98.7 F (37.1 C)  TempSrc: Oral  Weight: 198 lb (89.8 kg)     Physical Exam  General Appearance:    Alert, cooperative, no distress  HENT:   bilateral TM normal without fluid or infection, neck without nodes, pharynx  erythematous without exudate, frontal sinus tender and nasal mucosa congested  Eyes:    PERRL, conjunctiva/corneas clear, EOM's intact       Lungs:     Clear to auscultation bilaterally, respirations unlabored  Heart:    Regular rate and rhythm  Neurologic:   Awake, alert, oriented x 3. No apparent focal neurological           defect.           Assessment & Plan    1. Frontal sinusitis, unspecified chronicity  - amoxicillin (AMOXIL) 500 MG capsule; Take 2 capsules (1,000 mg total) by mouth 2 (two) times daily for 10 days.  Dispense: 40 capsule; Refill: 0  Call if symptoms change or if not  rapidly improving.       Lelon Huh, MD  [facility]

## 2018-12-30 NOTE — Patient Instructions (Signed)
.   Please review the attached list of medications and notify my office if there are any errors.   . Please bring all of your medications to every appointment so we can make sure that our medication list is the same as yours.   

## 2019-01-28 ENCOUNTER — Ambulatory Visit: Payer: 59 | Admitting: Urology

## 2019-03-15 ENCOUNTER — Encounter: Payer: Self-pay | Admitting: Urology

## 2019-03-15 ENCOUNTER — Ambulatory Visit: Payer: 59 | Admitting: Urology

## 2019-03-27 ENCOUNTER — Emergency Department: Payer: 59

## 2019-03-27 ENCOUNTER — Encounter: Payer: Self-pay | Admitting: Intensive Care

## 2019-03-27 ENCOUNTER — Emergency Department
Admission: EM | Admit: 2019-03-27 | Discharge: 2019-03-27 | Disposition: A | Discharge status: Home / Self Care | Payer: 59 | Attending: Emergency Medicine | Admitting: Emergency Medicine

## 2019-03-27 ENCOUNTER — Other Ambulatory Visit: Payer: Self-pay

## 2019-03-27 DIAGNOSIS — Y929 Unspecified place or not applicable: Secondary | ICD-10-CM | POA: Insufficient documentation

## 2019-03-27 DIAGNOSIS — Y999 Unspecified external cause status: Secondary | ICD-10-CM | POA: Insufficient documentation

## 2019-03-27 DIAGNOSIS — Y9389 Activity, other specified: Secondary | ICD-10-CM | POA: Diagnosis not present

## 2019-03-27 DIAGNOSIS — I10 Essential (primary) hypertension: Secondary | ICD-10-CM | POA: Insufficient documentation

## 2019-03-27 DIAGNOSIS — Z23 Encounter for immunization: Secondary | ICD-10-CM | POA: Diagnosis not present

## 2019-03-27 DIAGNOSIS — S91311A Laceration without foreign body, right foot, initial encounter: Secondary | ICD-10-CM | POA: Diagnosis present

## 2019-03-27 DIAGNOSIS — F17210 Nicotine dependence, cigarettes, uncomplicated: Secondary | ICD-10-CM | POA: Diagnosis not present

## 2019-03-27 DIAGNOSIS — Z79899 Other long term (current) drug therapy: Secondary | ICD-10-CM | POA: Diagnosis not present

## 2019-03-27 DIAGNOSIS — W268XXA Contact with other sharp object(s), not elsewhere classified, initial encounter: Secondary | ICD-10-CM | POA: Insufficient documentation

## 2019-03-27 MED ORDER — TETANUS-DIPHTH-ACELL PERTUSSIS 5-2.5-18.5 LF-MCG/0.5 IM SUSP
0.5000 mL | Freq: Once | INTRAMUSCULAR | Status: AC
  Administered 2019-03-27: 0.5 mL via INTRAMUSCULAR
  Filled 2019-03-27: qty 0.5

## 2019-03-27 MED ORDER — CEPHALEXIN 500 MG PO CAPS: 500.0000 mg | ORAL_CAPSULE | Freq: Three times a day (TID) | ORAL | 0 refills | Status: DC

## 2019-03-27 NOTE — ED Notes (Signed)
Pt verbalized understanding of discharge instructions. NAD at this time. 

## 2019-03-27 NOTE — Discharge Instructions (Signed)
Take Keflex 3 times daily for the next week.

## 2019-03-27 NOTE — ED Triage Notes (Addendum)
Patient cut the top of his right foot with hatchet last night around 9:30pm when chopping wood. Bleeding controlled. Unsure of last tetanus shot. +pp.

## 2019-03-27 NOTE — ED Provider Notes (Signed)
[facility] Emergency Department Provider Note  ____________________________________________  Time seen: Approximately 3:19 PM  I have reviewed the triage vital signs and the nursing notes.   HISTORY  Chief Complaint Extremity Laceration (right foot)    HPI Taylor Medina is a 53 y.o. male presents to the emergency department with a 3 cm linear laceration along the dorsal aspect of the right foot that was sustained around 9:00 pm last night. Patient attempted to put super glue on laceration which has since peeled away.  Laceration was sustained accidentally with hatchet while cutting wood.  Patient has had some numbness around laceration site.  He is unsure of his last tetanus shot.        Past Medical History:  Diagnosis Date  . HTN (hypertension)    episodic   . Palpitations     Patient Active Problem List   Diagnosis Date Noted  . Esophageal dysphagia   . Thickening of esophagus   . Reflux esophagitis   . Stomach irritation   . FH: colon cancer   . Special screening for malignant neoplasms, colon   . Benign neoplasm of ascending colon   . External hemorrhoids   . Benign neoplasm of descending colon   . OSA (obstructive sleep apnea) 10/24/2016  . Hypertriglyceridemia 12/24/2009  . HYPERTENSION, BENIGN 12/24/2009    Past Surgical History:  Procedure Laterality Date  . COLONOSCOPY WITH PROPOFOL N/A 12/10/2017   Procedure: COLONOSCOPY WITH PROPOFOL;  Surgeon: Virgel Manifold, MD;  Location: [facility] ENDOSCOPY;  Service: Endoscopy;  Laterality: N/A;  . ESOPHAGOGASTRODUODENOSCOPY (EGD) WITH PROPOFOL  12/10/2017   Procedure: ESOPHAGOGASTRODUODENOSCOPY (EGD) WITH PROPOFOL;  Surgeon: Virgel Manifold, MD;  Location: [facility] ENDOSCOPY;  Service: Endoscopy;;  . FRACTURE SURGERY    . hardware fractions     on R; several surgeries   . hernia repair surgery      Prior to Admission medications   Medication Sig Start Date End Date Taking? Authorizing  Provider  cephALEXin (KEFLEX) 500 MG capsule Take 1 capsule (500 mg total) by mouth 3 (three) times daily for 7 days. 03/27/19 04/03/19  Lannie Fields, PA-C  diazepam (VALIUM) 5 MG tablet One every 8 hours as needed for chest discomfort/stress 02/02/18   [name], PA  doxycycline (VIBRA-TABS) 100 MG tablet Take 1 tablet (100 mg total) by mouth 2 (two) times daily. 05/10/18   [name], PA  fluticasone (FLONASE) 50 MCG/ACT nasal spray Place 1 spray into the nose daily. Reported on 02/29/2016    [provider]  hydrALAZINE (APRESOLINE) 10 MG tablet Take 10 mg by mouth 3 (three) times daily as needed.      [provider]  ibuprofen (ADVIL,MOTRIN) 200 MG tablet Take 400 mg by mouth every 6 (six) hours as needed.    [provider]  ranitidine (ZANTAC) 150 MG capsule Take 150 mg by mouth 2 (two) times daily.    [provider]  tadalafil (CIALIS) 20 MG tablet Take 1 tablet (20 mg total) by mouth daily as needed for erectile dysfunction. 01/27/18   Stoioff, Ronda Fairly, MD  sildenafil (VIAGRA) 100 MG tablet Take 1 tablet (100 mg total) by mouth daily as needed for erectile dysfunction. Take two hours prior to intercourse on an empty stomach 07/28/16 07/28/16  Zara Council A, PA-C    Allergies Patient has no known allergies.  Family History  Problem Relation Age of Onset  . Cancer Other        family  hx  . Coronary artery disease Other        family hx  . Diabetes Other        family hx    Social History Social History   Tobacco Use  . Smoking status: Current Some Day Smoker    Types: Cigarettes    Last attempt to quit: 07/28/2013    Years since quitting: 5.6  . Smokeless tobacco: Never Used  Substance Use Topics  . Alcohol use: Yes    Alcohol/week: 9.0 standard drinks    Types: 9 Glasses of wine per week  . Drug use: No     Review of Systems  Constitutional: No fever/chills Eyes: No visual changes. No discharge ENT: No upper  respiratory complaints. Cardiovascular: no chest pain. Respiratory: no cough. No SOB. Gastrointestinal: No abdominal pain.  No nausea, no vomiting.  No diarrhea.  No constipation. Musculoskeletal: Negative for musculoskeletal pain. Skin: Patient has right foot laceration.  Neurological: Negative for headaches, focal weakness or numbness.  ____________________________________________   PHYSICAL EXAM:  VITAL SIGNS: ED Triage Vitals  Enc Vitals Group     BP 03/27/19 1317 (!) 171/110     Pulse Rate 03/27/19 1317 86     Resp 03/27/19 1317 16     Temp 03/27/19 1317 98.6 F (37 C)     Temp Source 03/27/19 1317 Oral     SpO2 03/27/19 1317 96 %     Weight 03/27/19 1318 200 lb (90.7 kg)     Height 03/27/19 1318 5\' 9"  (1.753 m)     Head Circumference --      Peak Flow --      Pain Score 03/27/19 1318 5     Pain Loc --      Pain Edu? --      Excl. in [place]? --      Constitutional: Alert and oriented. Well appearing and in no acute distress. Eyes: Conjunctivae are normal. PERRL. EOMI. Head: Atraumatic. Cardiovascular: Normal rate, regular rhythm. Normal S1 and S2.  Good peripheral circulation. Respiratory: Normal respiratory effort without tachypnea or retractions. Lungs CTAB. Good air entry to the bases with no decreased or absent breath sounds. Musculoskeletal: Full range of motion to all extremities. No gross deformities appreciated.   Neurologic:  Normal speech and language. No gross focal neurologic deficits are appreciated.  Skin: Patient has 2 cm linear right foot laceration that is deep to underlying dermis. Psychiatric: Mood and affect are normal. Speech and behavior are normal. Patient exhibits appropriate insight and judgement.   ____________________________________________   LABS (all labs ordered are listed, but only abnormal results are displayed)  Labs Reviewed - No data to  display ____________________________________________  EKG   ____________________________________________  RADIOLOGY I personally viewed and evaluated these images as part of my medical decision making, as well as reviewing the written report by the radiologist.  Dg Foot Complete Right  Result Date: 03/27/2019 CLINICAL DATA:  Laceration to the dorsal surface of foot along first metatarsal with a hatchet. EXAM: RIGHT FOOT COMPLETE - 3+ VIEW COMPARISON:  None. FINDINGS: There is no evidence of fracture or dislocation. There is no evidence of arthropathy or other focal bone abnormality. Soft tissues are unremarkable. IMPRESSION: Negative. Electronically Signed   By: Dorise Bullion III M.D   On: 03/27/2019 16:04    ____________________________________________    PROCEDURES  Procedure(s) performed:    Procedures  LACERATION REPAIR Performed by: Lannie Fields Authorized by: Lannie Fields Consent: Verbal consent obtained.  Risks and benefits: risks, benefits and alternatives were discussed Consent given by: patient Patient identity confirmed: provided demographic data Prepped and Draped in normal sterile fashion Wound explored  Laceration Location: Right foot   Laceration Length: 2 cm  No Foreign Bodies seen or palpated  Irrigation method: syringe Amount of cleaning: standard  Skin closure: Dermabond   Patient tolerance: Patient tolerated the procedure well with no immediate complications.   Medications  Tdap (BOOSTRIX) injection 0.5 mL (has no administration in time range)     ____________________________________________   INITIAL IMPRESSION / ASSESSMENT AND PLAN / ED COURSE  Pertinent labs & imaging results that were available during my care of the patient were reviewed by me and considered in my medical decision making (see chart for details).  Review of the [place] CSRS was performed in accordance of the [place] prior to dispensing any controlled drugs.          Assessment and plan Right foot laceration Patient presents to the emergency department with a 2 and half centimeter right foot laceration sustained accidentally with a hatchet.  Laceration is almost 24 hours old.  Patient wound was debrided in the emergency department and irrigated copiously with normal saline and Betadine.  Laceration was repaired using Dermabond.  X-ray examination of right foot revealed no fractures or retained foreign bodies.  Tetanus status was updated in the emergency department.  Patient was discharged with Keflex.  All patient questions were answered.     ____________________________________________  FINAL CLINICAL IMPRESSION(S) / ED DIAGNOSES  Final diagnoses:  Laceration of right foot, initial encounter      NEW MEDICATIONS STARTED DURING THIS VISIT:  ED Discharge Orders         Ordered    cephALEXin (KEFLEX) 500 MG capsule  3 times daily     03/27/19 1607              This chart was dictated using voice recognition software/Dragon. Despite best efforts to proofread, errors can occur which can change the meaning. Any change was purely unintentional.    Lannie Fields, PA-C 03/27/19 1614    Harvest Dark, MD 03/27/19 [phone]

## 2019-03-31 ENCOUNTER — Encounter: Payer: Self-pay | Admitting: Physician Assistant

## 2019-03-31 ENCOUNTER — Other Ambulatory Visit: Payer: Self-pay

## 2019-03-31 ENCOUNTER — Ambulatory Visit (INDEPENDENT_AMBULATORY_CARE_PROVIDER_SITE_OTHER): Payer: 59 | Admitting: Physician Assistant

## 2019-03-31 VITALS — BP 158/110 | HR 70 | Temp 98.2°F | Resp 16 | Wt 198.7 lb

## 2019-03-31 DIAGNOSIS — I10 Essential (primary) hypertension: Secondary | ICD-10-CM | POA: Diagnosis not present

## 2019-03-31 DIAGNOSIS — L089 Local infection of the skin and subcutaneous tissue, unspecified: Secondary | ICD-10-CM | POA: Diagnosis not present

## 2019-03-31 DIAGNOSIS — T148XXA Other injury of unspecified body region, initial encounter: Secondary | ICD-10-CM | POA: Diagnosis not present

## 2019-03-31 MED ORDER — AMLODIPINE BESYLATE 5 MG PO TABS: 5.0000 mg | ORAL_TABLET | Freq: Every day | ORAL | 0 refills | Status: DC

## 2019-03-31 MED ORDER — DOXYCYCLINE HYCLATE 100 MG PO TABS: 100.0000 mg | ORAL_TABLET | Freq: Two times a day (BID) | ORAL | 0 refills | Status: AC

## 2019-03-31 NOTE — Progress Notes (Signed)
Patient: Taylor Medina Male    DOB: Mar 28, 1966   53 y.o.   MRN: [id number] Visit Date: 04/08/2019  Today's Provider: Trinna Post, PA-C   Chief Complaint  Patient presents with  . Foot Injury   Subjective:     HPI  Follow up ER visit  Patient was seen in ER for injury to right foot on  03/26/2019. He was treated for a wound after he dropped an axe on it while cutting wound. Treatment for this included derma bond due to wound > 24 hours and keflex. Tetanus was updated at the time. Initially had put superglue on it prior to being treated. Xrays of foot were unremarkable. He reports excellent compliance with treatment. He reports this condition is Improved but feels there is some discharge and poor healing.   Hypertension: Previously prescribed hydralazine but not taking this anymore because it was too difficult to take three times a day.  BP Readings from Last 3 Encounters:  04/07/19 (!) 136/91  03/31/19 (!) 158/110  03/27/19 (!) 155/98   ------------------------------------------------------------------------------------   No Known Allergies   Current Outpatient Medications:  .  amLODipine (NORVASC) 10 MG tablet, Take 1 tablet (10 mg total) by mouth daily., Disp: 90 tablet, Rfl: 0 .  diazepam (VALIUM) 5 MG tablet, One every 8 hours as needed for chest discomfort/stress, Disp: 30 tablet, Rfl: 1 .  fluticasone (FLONASE) 50 MCG/ACT nasal spray, Place 1 spray into the nose daily. Reported on 02/29/2016, Disp: , Rfl:  .  hydrALAZINE (APRESOLINE) 10 MG tablet, Take 10 mg by mouth 3 (three) times daily as needed.  , Disp: , Rfl:  .  ibuprofen (ADVIL,MOTRIN) 200 MG tablet, Take 400 mg by mouth every 6 (six) hours as needed., Disp: , Rfl:  .  Icosapent Ethyl (VASCEPA) 1 g CAPS, Take 2 capsules (2 g total) by mouth daily., Disp: 180 capsule, Rfl: 0 .  ranitidine (ZANTAC) 150 MG capsule, Take 150 mg by mouth 2 (two) times daily., Disp: , Rfl:  .  tadalafil (CIALIS) 20 MG  tablet, Take 1 tablet (20 mg total) by mouth daily as needed for erectile dysfunction., Disp: 6 tablet, Rfl: 10  Review of Systems  Cardiovascular: Negative.   Skin: Positive for wound.    Social History   Tobacco Use  . Smoking status: Current Some Day Smoker    Types: Cigarettes    Last attempt to quit: 07/28/2013    Years since quitting: 5.6  . Smokeless tobacco: Never Used  Substance Use Topics  . Alcohol use: Yes    Alcohol/week: 9.0 standard drinks    Types: 9 Glasses of wine per week      Objective:   BP (!) 158/110 (BP Location: Left Arm, Patient Position: Sitting, Cuff Size: Large)   Pulse 70   Temp 98.2 F (36.8 C) (Oral)   Resp 16   Wt 198 lb 11.2 oz (90.1 kg)   SpO2 95%   BMI 29.34 kg/m  Vitals:   03/31/19 1150  BP: (!) 158/110  Pulse: 70  Resp: 16  Temp: 98.2 F (36.8 C)  TempSrc: Oral  SpO2: 95%  Weight: 198 lb 11.2 oz (90.1 kg)     Physical Exam Constitutional:      Appearance: Normal appearance.  Cardiovascular:     Rate and Rhythm: Normal rate and regular rhythm.     Heart sounds: Normal heart sounds.  Musculoskeletal:       Feet:  Skin:  General: Skin is warm and dry.     Findings: Erythema present.  Neurological:     Mental Status: He is alert and oriented to person, place, and time. Mental status is at baseline.  Psychiatric:        Mood and Affect: Mood normal.        Behavior: Behavior normal.         Assessment & Plan    1. Wound infection  - doxycycline (VIBRA-TABS) 100 MG tablet; Take 1 tablet (100 mg total) by mouth 2 (two) times daily for 7 days.  Dispense: 14 tablet; Refill: 0 - DG Foot Complete Right; Future  2. HYPERTENSION, BENIGN  Will start amlodipine 5 mg daily. F/u 1 week.   The entirety of the information documented in the History of Present Illness, Review of Systems and Physical Exam were personally obtained by me. Portions of this information were initially documented by Lynford Humphrey, CMA and  reviewed by me for thoroughness and accuracy.        Trinna Post, PA-C  [facility]

## 2019-04-05 ENCOUNTER — Encounter: Payer: Self-pay | Admitting: Physician Assistant

## 2019-04-07 ENCOUNTER — Encounter: Payer: Self-pay | Admitting: Physician Assistant

## 2019-04-07 ENCOUNTER — Other Ambulatory Visit: Payer: Self-pay

## 2019-04-07 ENCOUNTER — Ambulatory Visit (INDEPENDENT_AMBULATORY_CARE_PROVIDER_SITE_OTHER): Payer: 59 | Admitting: Physician Assistant

## 2019-04-07 ENCOUNTER — Other Ambulatory Visit: Payer: Self-pay | Admitting: Physician Assistant

## 2019-04-07 VITALS — BP 136/91 | HR 67 | Temp 98.7°F | Resp 16 | Wt 198.0 lb

## 2019-04-07 DIAGNOSIS — I10 Essential (primary) hypertension: Secondary | ICD-10-CM

## 2019-04-07 DIAGNOSIS — E781 Pure hyperglyceridemia: Secondary | ICD-10-CM | POA: Diagnosis not present

## 2019-04-07 DIAGNOSIS — Z114 Encounter for screening for human immunodeficiency virus [HIV]: Secondary | ICD-10-CM | POA: Diagnosis not present

## 2019-04-07 DIAGNOSIS — S91331D Puncture wound without foreign body, right foot, subsequent encounter: Secondary | ICD-10-CM

## 2019-04-07 MED ORDER — AMLODIPINE BESYLATE 10 MG PO TABS: 10.0000 mg | ORAL_TABLET | Freq: Every day | ORAL | 0 refills | Status: DC

## 2019-04-07 NOTE — Patient Instructions (Signed)

## 2019-04-07 NOTE — Progress Notes (Signed)
Patient: Taylor Medina Male    DOB: 11-09-1965   53 y.o.   MRN: [id number] Visit Date: 04/07/2019  Today's Provider: Trinna Post, PA-C   Chief Complaint  Patient presents with  . Follow-up   Subjective:     HPI  Follow up for blood pressure  The patient was last seen for this 1 weeks ago. Changes made at last visit include start amlodipine 5 mg daily.   He reports excellent compliance with treatment. He feels that condition is Improved. He is not having side effects.   BP Readings from Last 3 Encounters:  04/07/19 (!) 136/91  03/31/19 (!) 158/110  03/27/19 (!) 155/98   Wt Readings from Last 3 Encounters:  04/07/19 198 lb (89.8 kg)  03/31/19 198 lb 11.2 oz (90.1 kg)  03/27/19 200 lb (90.7 kg)   Doesn't smoke currently.  HLD: History of severely elevated triglycerides, not checked since 2017. Will order fasting labs. Will likely need statin therapy and possibly additional medications.   Lipid Panel     Component Value Date/Time   CHOL 268 (H) 03/03/2016 1112   TRIG 1,506 (HH) 03/03/2016 1112   HDL 24 (L) 03/03/2016 1112   CHOLHDL 11.2 (H) 03/03/2016 1112   LDLCALC Comment 03/03/2016 1112   LDLDIRECT 63 03/03/2016 1112   Reports his foot is healing well.   ------------------------------------------------------------------------------------   No Known Allergies   Current Outpatient Medications:  .  diazepam (VALIUM) 5 MG tablet, One every 8 hours as needed for chest discomfort/stress, Disp: 30 tablet, Rfl: 1 .  doxycycline (VIBRA-TABS) 100 MG tablet, Take 1 tablet (100 mg total) by mouth 2 (two) times daily for 7 days., Disp: 14 tablet, Rfl: 0 .  fluticasone (FLONASE) 50 MCG/ACT nasal spray, Place 1 spray into the nose daily. Reported on 02/29/2016, Disp: , Rfl:  .  hydrALAZINE (APRESOLINE) 10 MG tablet, Take 10 mg by mouth 3 (three) times daily as needed.  , Disp: , Rfl:  .  ibuprofen (ADVIL,MOTRIN) 200 MG tablet, Take 400 mg by mouth every 6  (six) hours as needed., Disp: , Rfl:  .  tadalafil (CIALIS) 20 MG tablet, Take 1 tablet (20 mg total) by mouth daily as needed for erectile dysfunction., Disp: 6 tablet, Rfl: 10 .  amLODipine (NORVASC) 10 MG tablet, Take 1 tablet (10 mg total) by mouth daily., Disp: 90 tablet, Rfl: 0 .  ranitidine (ZANTAC) 150 MG capsule, Take 150 mg by mouth 2 (two) times daily., Disp: , Rfl:   Review of Systems  HENT: Positive for congestion.   Respiratory: Positive for cough.   Cardiovascular: Positive for leg swelling.  Skin: Positive for rash.  Allergic/Immunologic: Positive for environmental allergies.    Social History   Tobacco Use  . Smoking status: Current Some Day Smoker    Types: Cigarettes    Last attempt to quit: 07/28/2013    Years since quitting: 5.6  . Smokeless tobacco: Never Used  Substance Use Topics  . Alcohol use: Yes    Alcohol/week: 9.0 standard drinks    Types: 9 Glasses of wine per week      Objective:   BP (!) 136/91 (BP Location: Left Arm, Patient Position: Sitting, Cuff Size: Large)   Pulse 67   Temp 98.7 F (37.1 C) (Oral)   Resp 16   Wt 198 lb (89.8 kg)   SpO2 96%   BMI 29.24 kg/m  Vitals:   04/07/19 1044  BP: (!) 136/91  Pulse: 67  Resp: 16  Temp: 98.7 F (37.1 C)  TempSrc: Oral  SpO2: 96%  Weight: 198 lb (89.8 kg)     Physical Exam Constitutional:      Appearance: Normal appearance.  Cardiovascular:     Rate and Rhythm: Normal rate and regular rhythm.     Heart sounds: Normal heart sounds.  Pulmonary:     Effort: Pulmonary effort is normal.     Breath sounds: Normal breath sounds.  Skin:    General: Skin is warm and dry.  Neurological:     Mental Status: He is alert and oriented to person, place, and time. Mental status is at baseline.  Psychiatric:        Mood and Affect: Mood normal.        Behavior: Behavior normal.      No results found for any visits on 04/07/19.     Assessment & Plan    1. HYPERTENSION, BENIGN  BP is  better today though still high. Increase amlodipine to 10 mg daily and f/u 6 weeks.   - amLODipine (NORVASC) 10 MG tablet; Take 1 tablet (10 mg total) by mouth daily.  Dispense: 90 tablet; Refill: 0 - Comprehensive Metabolic Panel (CMET) - TSH - CBC with Differential  2. Hypertriglyceridemia  Fasting labs as below and plan to start statin. Gave samples of Vascepa as well, may need to start that.   - Lipid Profile - Comprehensive Metabolic Panel (CMET) - Direct LDL  3. Encounter for screening for HIV  - HIV antibody (with reflex)  4. Penetrating wound of right foot, subsequent encounter  Continues to heal. Avoid neosporin, this can be irritating.   The entirety of the information documented in the History of Present Illness, Review of Systems and Physical Exam were personally obtained by me. Portions of this information were initially documented by Lynford Humphrey, CMA and reviewed by me for thoroughness and accuracy.      Trinna Post, PA-C  [facility]

## 2019-04-08 ENCOUNTER — Other Ambulatory Visit: Payer: Self-pay | Admitting: Physician Assistant

## 2019-04-08 DIAGNOSIS — E781 Pure hyperglyceridemia: Secondary | ICD-10-CM

## 2019-04-08 LAB — LIPID PANEL
Chol/HDL Ratio: 8 ratio — ABNORMAL HIGH (ref 0.0–5.0)
Cholesterol, Total: 223 mg/dL — ABNORMAL HIGH (ref 100–199)
HDL: 28 mg/dL — ABNORMAL LOW (ref >39)
Triglycerides: 730 mg/dL (ref 0–149)

## 2019-04-08 LAB — CBC WITH DIFFERENTIAL/PLATELET
Basophils Absolute: 0 10*3/uL (ref 0.0–0.2)
Basos: 0 %
EOS (ABSOLUTE): 0.2 10*3/uL (ref 0.0–0.4)
Eos: 4 %
Hematocrit: 53 % — ABNORMAL HIGH (ref 37.5–51.0)
Hemoglobin: 18.3 g/dL — ABNORMAL HIGH (ref 13.0–17.7)
Immature Grans (Abs): 0 10*3/uL (ref 0.0–0.1)
Immature Granulocytes: 0 %
Lymphocytes Absolute: 1.8 10*3/uL (ref 0.7–3.1)
Lymphs: 29 %
MCH: 32.8 pg (ref 26.6–33.0)
MCHC: 34.5 g/dL (ref 31.5–35.7)
MCV: 95 fL (ref 79–97)
Monocytes Absolute: 0.6 10*3/uL (ref 0.1–0.9)
Monocytes: 10 %
Neutrophils Absolute: 3.5 10*3/uL (ref 1.4–7.0)
Neutrophils: 57 %
Platelets: 203 10*3/uL (ref 150–450)
RBC: 5.58 x10E6/uL (ref 4.14–5.80)
RDW: 13.2 % (ref 11.6–15.4)
WBC: 6.1 10*3/uL (ref 3.4–10.8)

## 2019-04-08 LAB — TSH: TSH: 1.77 u[IU]/mL (ref 0.450–4.500)

## 2019-04-08 LAB — COMPREHENSIVE METABOLIC PANEL
ALT: 142 IU/L — ABNORMAL HIGH (ref 0–44)
AST: 78 IU/L — ABNORMAL HIGH (ref 0–40)
Albumin/Globulin Ratio: 1.5 (ref 1.2–2.2)
Albumin: 4.3 g/dL (ref 4.0–5.0)
Alkaline Phosphatase: 68 IU/L (ref 39–117)
BUN/Creatinine Ratio: 15 (ref 9–20)
BUN: 14 mg/dL (ref 6–24)
Bilirubin Total: 0.5 mg/dL (ref 0.0–1.2)
CO2: 19 mmol/L — ABNORMAL LOW (ref 20–29)
Calcium: 9.8 mg/dL (ref 8.7–10.2)
Chloride: 100 mmol/L (ref 96–106)
Creatinine, Ser: 0.91 mg/dL (ref 0.76–1.27)
GFR calc Af Amer: 113 mL/min/{1.73_m2} (ref >59)
GFR calc non Af Amer: 98 mL/min/{1.73_m2} (ref >59)
Globulin, Total: 2.9 g/dL (ref 1.5–4.5)
Glucose: 101 mg/dL — ABNORMAL HIGH (ref 65–99)
Potassium: 4.4 mmol/L (ref 3.5–5.2)
Sodium: 137 mmol/L (ref 134–144)
Total Protein: 7.2 g/dL (ref 6.0–8.5)

## 2019-04-08 LAB — HIV ANTIBODY (ROUTINE TESTING W REFLEX): HIV Screen 4th Generation wRfx: NONREACTIVE

## 2019-04-08 LAB — LDL CHOLESTEROL, DIRECT: LDL Direct: 99 mg/dL (ref 0–99)

## 2019-04-08 MED ORDER — VASCEPA 1 G PO CAPS: 2.0000 g | ORAL_CAPSULE | Freq: Every day | ORAL | 0 refills | Status: DC

## 2019-04-08 NOTE — Progress Notes (Signed)
Sent in Vascepa 2 g daily.

## 2019-04-08 NOTE — Patient Instructions (Signed)

## 2019-04-11 ENCOUNTER — Telehealth: Payer: Self-pay | Admitting: Physician Assistant

## 2019-04-11 NOTE — Telephone Encounter (Signed)
-----   Message from Trinna Post, [place] sent at 04/08/2019 12:40 PM EDT ----- His cholesterol is high, though slightly lower than last year. His triglycerides are still very high. Ideally, we would start him on a medication like Lipitor but his liver enymzes are too high for this. Let's have him take 2g of the vascepa daily and I will send in a prescription. Has he had his liver enzymes worked up before? If not, we will need some follow up labs at his next office visit.

## 2019-04-11 NOTE — Telephone Encounter (Signed)
Spoke with pt to relay this message. Pt states he picked up Vascepa Rx from pharmacy today.   Pt has not had liver enzymes worked up previously and understands the need for follow labs for this at August 6th appt.

## 2019-05-19 ENCOUNTER — Other Ambulatory Visit: Payer: Self-pay

## 2019-05-19 ENCOUNTER — Encounter: Payer: Self-pay | Admitting: Physician Assistant

## 2019-05-19 ENCOUNTER — Ambulatory Visit (INDEPENDENT_AMBULATORY_CARE_PROVIDER_SITE_OTHER): Payer: 59 | Admitting: Physician Assistant

## 2019-05-19 VITALS — BP 121/84 | HR 61 | Temp 98.1°F | Resp 16 | Ht 69.0 in | Wt 190.0 lb

## 2019-05-19 DIAGNOSIS — R2232 Localized swelling, mass and lump, left upper limb: Secondary | ICD-10-CM

## 2019-05-19 DIAGNOSIS — I10 Essential (primary) hypertension: Secondary | ICD-10-CM | POA: Diagnosis not present

## 2019-05-19 DIAGNOSIS — R748 Abnormal levels of other serum enzymes: Secondary | ICD-10-CM

## 2019-05-19 NOTE — Patient Instructions (Signed)

## 2019-05-19 NOTE — Progress Notes (Signed)
Patient: Taylor Medina Male    DOB: 26-Dec-1965   53 y.o.   MRN: [id number] Visit Date: 05/19/2019  Today's Provider: Trinna Post, PA-C   Chief Complaint  Patient presents with  . Hypertension   Subjective:     HPI  Hypertension, follow-up:  BP Readings from Last 3 Encounters:  05/19/19 121/84  04/07/19 (!) 136/91  03/31/19 (!) 158/110    He was last seen for hypertension 1 months ago.  BP at that visit was 136/91. Management changes since that visit include increase amlodipine to 10 mg daily. Doing well with medication.  He reports excellent compliance with treatment. He is not having side effects.  He is not exercising. He is not adherent to low salt diet.   Outside blood pressures are stable. He is experiencing none.  Patient denies chest pain and lower extremity edema.   Cardiovascular risk factors include hypertension and smoking/ tobacco exposure.  Use of agents associated with hypertension: none.     Weight trend: stable Wt Readings from Last 3 Encounters:  05/19/19 190 lb (86.2 kg)  04/07/19 198 lb (89.8 kg)  03/31/19 198 lb 11.2 oz (90.1 kg)    Current diet: well balanced  Reports he has stopped smoking after restarting briefly.   Elevated Liver Enzymes:  CMP Latest Ref Rng & Units 05/19/2019 04/07/2019 03/03/2016  Glucose 65 - 99 mg/dL 102(H) 101(H) 88  BUN 6 - 24 mg/dL 12 14 12   Creatinine 0.76 - 1.27 mg/dL 0.90 0.91 1.02  Sodium 134 - 144 mmol/L 138 137 138  Potassium 3.5 - 5.2 mmol/L 4.4 4.4 4.9  Chloride 96 - 106 mmol/L 103 100 97  CO2 20 - 29 mmol/L 21 19(L) 20  Calcium 8.7 - 10.2 mg/dL 9.2 9.8 9.2  Total Protein 6.0 - 8.5 g/dL 6.7 7.2 7.1  Total Bilirubin 0.0 - 1.2 mg/dL 0.6 0.5 0.4  Alkaline Phos 39 - 117 IU/L 67 68 70  AST 0 - 40 IU/L 101(H) 78(H) 50(H)  ALT 0 - 44 IU/L 177(H) 142(H) 121(H)    Alcohol 3x a week, 2-3 glasses at a time. Uses Tylenol. Has not had an ultrasound of his liver.   He also has a mass in his left  underarm that has been there for 6 months. It has not grown and it is not painful. Denies fevers, chills.   Ultrasound left axillary  ------------------------------------------------------------------------   No Known Allergies   Current Outpatient Medications:  .  amLODipine (NORVASC) 10 MG tablet, Take 1 tablet (10 mg total) by mouth daily., Disp: 90 tablet, Rfl: 0 .  diazepam (VALIUM) 5 MG tablet, One every 8 hours as needed for chest discomfort/stress, Disp: 30 tablet, Rfl: 1 .  fluticasone (FLONASE) 50 MCG/ACT nasal spray, Place 1 spray into the nose daily. Reported on 02/29/2016, Disp: , Rfl:  .  ibuprofen (ADVIL,MOTRIN) 200 MG tablet, Take 400 mg by mouth every 6 (six) hours as needed., Disp: , Rfl:  .  Icosapent Ethyl (VASCEPA) 1 g CAPS, Take 2 capsules (2 g total) by mouth daily., Disp: 180 capsule, Rfl: 0 .  ranitidine (ZANTAC) 150 MG capsule, Take 150 mg by mouth 2 (two) times daily., Disp: , Rfl:  .  tadalafil (CIALIS) 20 MG tablet, Take 1 tablet (20 mg total) by mouth daily as needed for erectile dysfunction., Disp: 6 tablet, Rfl: 10  Review of Systems  Constitutional: Negative.   Respiratory: Negative.   Cardiovascular: Negative.  Social History   Tobacco Use  . Smoking status: Current Some Day Smoker    Types: Cigarettes    Last attempt to quit: 07/28/2013    Years since quitting: 5.8  . Smokeless tobacco: Never Used  Substance Use Topics  . Alcohol use: Yes    Alcohol/week: 9.0 standard drinks    Types: 9 Glasses of wine per week      Objective:   BP 121/84 (BP Location: Right Arm, Patient Position: Sitting, Cuff Size: Normal)   Pulse 61   Temp 98.1 F (36.7 C) (Oral)   Resp 16   Ht 5\' 9"  (1.753 m)   Wt 190 lb (86.2 kg)   SpO2 95%   BMI 28.06 kg/m  Vitals:   05/19/19 0811  BP: 121/84  Pulse: 61  Resp: 16  Temp: 98.1 F (36.7 C)  TempSrc: Oral  SpO2: 95%  Weight: 190 lb (86.2 kg)  Height: 5\' 9"  (1.753 m)     Physical Exam  Constitutional:      Appearance: Normal appearance.  Cardiovascular:     Rate and Rhythm: Normal rate and regular rhythm.     Heart sounds: Normal heart sounds.  Pulmonary:     Effort: Pulmonary effort is normal.     Breath sounds: Normal breath sounds.  Lymphadenopathy:     Upper Body:     Left upper body: Axillary adenopathy present.     Comments: 1 cm nontender, mobile mass in left axillary region.   Neurological:     Mental Status: He is alert and oriented to person, place, and time. Mental status is at baseline.  Psychiatric:        Mood and Affect: Mood normal.        Behavior: Behavior normal.      No results found for any visits on 05/19/19.     Assessment & Plan    1. HYPERTENSION, BENIGN  Well controlled, continue amlodipine 10 mg daily.   - Lipid Profile  2. Elevated liver enzymes  Testing as below. Advised to stop using alcohol. Will order liver US.  - Comprehensive Metabolic Panel (CMET) - Hepatitis c antibody (reflex) - Hepatitis B Surface AntiBODY  3. Axillary mass, left  Palpable left axillary mass, nontender. Feels like lymph node. Recent white counts and HIV normal. Will order ultrasound. [facility] requires mammogram as well.   The entirety of the information documented in the History of Present Illness, Review of Systems and Physical Exam were personally obtained by me. Portions of this information were initially documented by Jennings Books, CMA and reviewed by me for thoroughness and accuracy.    F/u PRN       Trinna Post, PA-C  [facility]

## 2019-05-20 LAB — COMPREHENSIVE METABOLIC PANEL
ALT: 177 IU/L — ABNORMAL HIGH (ref 0–44)
AST: 101 IU/L — ABNORMAL HIGH (ref 0–40)
Albumin/Globulin Ratio: 1.7 (ref 1.2–2.2)
Albumin: 4.2 g/dL (ref 3.8–4.9)
Alkaline Phosphatase: 67 IU/L (ref 39–117)
BUN/Creatinine Ratio: 13 (ref 9–20)
BUN: 12 mg/dL (ref 6–24)
Bilirubin Total: 0.6 mg/dL (ref 0.0–1.2)
CO2: 21 mmol/L (ref 20–29)
Calcium: 9.2 mg/dL (ref 8.7–10.2)
Chloride: 103 mmol/L (ref 96–106)
Creatinine, Ser: 0.9 mg/dL (ref 0.76–1.27)
GFR calc Af Amer: 114 mL/min/{1.73_m2} (ref >59)
GFR calc non Af Amer: 99 mL/min/{1.73_m2} (ref >59)
Globulin, Total: 2.5 g/dL (ref 1.5–4.5)
Glucose: 102 mg/dL — ABNORMAL HIGH (ref 65–99)
Potassium: 4.4 mmol/L (ref 3.5–5.2)
Sodium: 138 mmol/L (ref 134–144)
Total Protein: 6.7 g/dL (ref 6.0–8.5)

## 2019-05-20 LAB — LIPID PANEL
Chol/HDL Ratio: 7.2 ratio — ABNORMAL HIGH (ref 0.0–5.0)
Cholesterol, Total: 186 mg/dL (ref 100–199)
HDL: 26 mg/dL — ABNORMAL LOW (ref >39)
Triglycerides: 485 mg/dL — ABNORMAL HIGH (ref 0–149)

## 2019-05-20 LAB — HCV COMMENT:

## 2019-05-20 LAB — HEPATITIS C ANTIBODY (REFLEX): HCV Ab: <0.1 s/co ratio (ref 0.0–0.9)

## 2019-05-20 LAB — HEPATITIS B SURFACE ANTIBODY,QUALITATIVE: Hep B Surface Ab, Qual: REACTIVE

## 2019-05-24 ENCOUNTER — Encounter: Payer: Self-pay | Admitting: Physician Assistant

## 2019-05-24 ENCOUNTER — Telehealth: Payer: Self-pay | Admitting: Physician Assistant

## 2019-05-24 ENCOUNTER — Other Ambulatory Visit: Payer: Self-pay | Admitting: Physician Assistant

## 2019-05-24 DIAGNOSIS — R748 Abnormal levels of other serum enzymes: Secondary | ICD-10-CM

## 2019-05-24 DIAGNOSIS — R2232 Localized swelling, mass and lump, left upper limb: Secondary | ICD-10-CM

## 2019-05-24 NOTE — Telephone Encounter (Signed)
Patient is a male and he has left axillary mass. Can we call and see if this is standard protocol for male patient?

## 2019-05-24 NOTE — Telephone Encounter (Signed)
Per [facility] they will need orders for diagnostic bilateral mammogram TOMO [id number],Left breat limited ultrasound [id number] and right breat limited ultrasound [id number]

## 2019-05-26 NOTE — Telephone Encounter (Signed)
[facility] said this is standard protocol for males 25+ so yes you would have to order as below. KW

## 2019-05-27 NOTE — Telephone Encounter (Signed)
Yes I understand but he had an area in his shoulder that he thought was a lymph node and this is the process to image it.

## 2019-05-27 NOTE — Telephone Encounter (Signed)
Ok, can we call patient and let him know this is the case? If he is agreeable, can you please place additional orders under left axillary mass. Thanks.

## 2019-05-27 NOTE — Telephone Encounter (Signed)
So I spoke with patient on the phone and he seemed very confused about mass on left side he states that he did not need mammogram and that he had came in with complaints of shoulder pain. KW

## 2019-05-27 NOTE — Addendum Note (Signed)
Addended by: Minette Headland on: 05/27/2019 04:39 PM   Modules accepted: Orders

## 2019-06-02 ENCOUNTER — Ambulatory Visit
Admission: RE | Admit: 2019-06-02 | Discharge: 2019-06-02 | Disposition: A | Discharge status: Home / Self Care | Payer: 59 | Source: Ambulatory Visit | Attending: Physician Assistant | Admitting: Physician Assistant

## 2019-06-02 ENCOUNTER — Other Ambulatory Visit: Payer: Self-pay

## 2019-06-02 DIAGNOSIS — R748 Abnormal levels of other serum enzymes: Secondary | ICD-10-CM | POA: Diagnosis not present

## 2019-06-03 ENCOUNTER — Encounter (INDEPENDENT_AMBULATORY_CARE_PROVIDER_SITE_OTHER): Payer: 59 | Admitting: Physician Assistant

## 2019-06-03 DIAGNOSIS — F10230 Alcohol dependence with withdrawal, uncomplicated: Secondary | ICD-10-CM

## 2019-06-03 DIAGNOSIS — F10930 Alcohol use, unspecified with withdrawal, uncomplicated: Secondary | ICD-10-CM

## 2019-06-06 ENCOUNTER — Ambulatory Visit: Admission: RE | Admit: 2019-06-06 | Payer: 59 | Source: Ambulatory Visit

## 2019-06-06 ENCOUNTER — Ambulatory Visit
Admission: RE | Admit: 2019-06-06 | Discharge: 2019-06-06 | Disposition: A | Discharge status: Home / Self Care | Payer: 59 | Source: Ambulatory Visit | Attending: Physician Assistant | Admitting: Physician Assistant

## 2019-06-06 DIAGNOSIS — R2232 Localized swelling, mass and lump, left upper limb: Secondary | ICD-10-CM | POA: Insufficient documentation

## 2019-06-06 MED ORDER — GABAPENTIN 300 MG PO CAPS: 300.0000 mg | ORAL_CAPSULE | Freq: Three times a day (TID) | ORAL | 0 refills | Status: DC

## 2019-06-06 NOTE — Telephone Encounter (Signed)
Patient is experiencing restlessness, anxiety, and insomnia after cutting down alcohol. Denies shaking, confusion, history of DTs. Will give gabapentin 300 mg TID.

## 2019-06-08 ENCOUNTER — Telehealth: Payer: Self-pay | Admitting: *Deleted

## 2019-06-08 DIAGNOSIS — R2232 Localized swelling, mass and lump, left upper limb: Secondary | ICD-10-CM

## 2019-06-08 NOTE — Telephone Encounter (Signed)
-----   Message from Trinna Post, [place] sent at 06/07/2019 12:34 PM EDT ----- Looks like imaging of mass in the patient's left shoulder was nonspecific, it did not however look like a lymph node. Would recommend a surgeon for further evaluation and possible biopsy. Please let me know if he would like to proceed with referral.

## 2019-06-08 NOTE — Telephone Encounter (Signed)
Patient was notified of results. Expressed understanding. Referral was placed.

## 2019-06-21 ENCOUNTER — Ambulatory Visit (INDEPENDENT_AMBULATORY_CARE_PROVIDER_SITE_OTHER): Payer: 59 | Admitting: General Surgery

## 2019-06-21 ENCOUNTER — Other Ambulatory Visit: Payer: Self-pay

## 2019-06-21 ENCOUNTER — Encounter: Payer: Self-pay | Admitting: General Surgery

## 2019-06-21 VITALS — BP 130/83 | HR 67 | Temp 97.8°F | Ht 69.0 in | Wt 190.8 lb

## 2019-06-21 DIAGNOSIS — R2230 Localized swelling, mass and lump, unspecified upper limb: Secondary | ICD-10-CM

## 2019-06-21 DIAGNOSIS — R222 Localized swelling, mass and lump, trunk: Secondary | ICD-10-CM | POA: Diagnosis not present

## 2019-06-21 DIAGNOSIS — M7989 Other specified soft tissue disorders: Secondary | ICD-10-CM

## 2019-06-21 NOTE — Patient Instructions (Signed)
If in the future you want to have removed please contact us for this.   Follow-up with our office as needed.  Please call and ask to speak with a nurse if you develop questions or concerns.   Lipoma  A lipoma is a noncancerous (benign) tumor that is made up of fat cells. This is a very common type of soft-tissue growth. Lipomas are usually found under the skin (subcutaneous). They may occur in any tissue of the body that contains fat. Common areas for lipomas to appear include the back, shoulders, buttocks, and thighs.  Lipomas grow slowly, and they are usually painless. Most lipomas do not cause problems and do not require treatment. What are the causes? The cause of this condition is not known. What increases the risk? You are more likely to develop this condition if:  You are 41-35 years old.  You have a family history of lipomas. What are the signs or symptoms? A lipoma usually appears as a small, round bump under the skin. In most cases, the lump will:  Feel soft or rubbery.  Not cause pain or other symptoms. However, if a lipoma is located in an area where it pushes on nerves, it can become painful or cause other symptoms. How is this diagnosed? A lipoma can usually be diagnosed with a physical exam. You may also have tests to confirm the diagnosis and to rule out other conditions. Tests may include:  Imaging tests, such as a CT scan or MRI.  Removal of a tissue sample to be looked at under a microscope (biopsy). How is this treated? Treatment for this condition depends on the size of the lipoma and whether it is causing any symptoms.  For small lipomas that are not causing problems, no treatment is needed.  If a lipoma is bigger or it causes problems, surgery may be done to remove the lipoma. Lipomas can also be removed to improve appearance. Most often, the procedure is done after applying a medicine that numbs the area (local anesthetic). Follow these instructions at  home:  Watch your lipoma for any changes.  Keep all follow-up visits as told by your health care provider. This is important. Contact a health care provider if:  Your lipoma becomes larger or hard.  Your lipoma becomes painful, red, or increasingly swollen. These could be signs of infection or a more serious condition. Get help right away if:  You develop tingling or numbness in an area near the lipoma. This could indicate that the lipoma is causing nerve damage. Summary  A lipoma is a noncancerous tumor that is made up of fat cells.  Most lipomas do not cause problems and do not require treatment.  If a lipoma is bigger or it causes problems, surgery may be done to remove the lipoma. This information is not intended to replace advice given to you by your health care provider. Make sure you discuss any questions you have with your health care provider. Document Released: 09/19/2002 Document Revised: 09/15/2017 Document Reviewed: 09/15/2017 Elsevier Patient Education  [place].

## 2019-06-21 NOTE — Progress Notes (Signed)
Patient ID: BOSCO BRATHWAITE, male   DOB: August 28, 1966, 53 y.o.   MRN: [id number]  Lump/mass on arm and back   HPI MABLE THEDFORD is a 53 y.o. male.   He has been referred for surgical evaluation by his primary care provider, Carles Collet, PA-C.  He reports that he has had a lump on his anterior left shoulder for about 6 months.  It is nontender.  It is never become red, hot, or drained any material.  He also has a lump on his back, just to the right of midline below his shoulder blade.  He says is been present for a few years.  He states that this mass occasionally bothers him with some tingling and itching, but it is not painful.  Similarly, it is never become hot, red, or drained anything.  An ultrasound was done of this area.  The findings were fairly nonspecific, see documentation below.   Past Medical History:  Diagnosis Date  . Diverticulosis   . HTN (hypertension)    episodic   . Palpitations     Past Surgical History:  Procedure Laterality Date  . COLONOSCOPY WITH PROPOFOL N/A 12/10/2017   Procedure: COLONOSCOPY WITH PROPOFOL;  Surgeon: Virgel Manifold, MD;  Location: [facility] ENDOSCOPY;  Service: Endoscopy;  Laterality: N/A;  . ESOPHAGOGASTRODUODENOSCOPY (EGD) WITH PROPOFOL  12/10/2017   Procedure: ESOPHAGOGASTRODUODENOSCOPY (EGD) WITH PROPOFOL;  Surgeon: Virgel Manifold, MD;  Location: [facility] ENDOSCOPY;  Service: Endoscopy;;  . FRACTURE SURGERY    . hardware fractions     on R; several surgeries   . hernia repair surgery      Family History  Problem Relation Age of Onset  . Cancer Other        family hx  . Coronary artery disease Other        family hx  . Diabetes Other        family hx    Social History Social History   Tobacco Use  . Smoking status: Former Smoker    Types: Cigarettes    Quit date: 07/28/2013    Years since quitting: 5.9  . Smokeless tobacco: Never Used  Substance Use Topics  . Alcohol use: Yes    Alcohol/week: 9.0 standard drinks    Types:  9 Glasses of wine per week  . Drug use: No    Allergies  Allergen Reactions  . Influenza Vaccines     Guillain barre syndrome    Current Outpatient Medications  Medication Sig Dispense Refill  . amLODipine (NORVASC) 10 MG tablet Take 1 tablet (10 mg total) by mouth daily. 90 tablet 0  . diazepam (VALIUM) 5 MG tablet One every 8 hours as needed for chest discomfort/stress 30 tablet 1  . diclofenac sodium (VOLTAREN) 1 % GEL Apply topically.    . gabapentin (NEURONTIN) 300 MG capsule Take 1 capsule (300 mg total) by mouth 3 (three) times daily. 90 capsule 0  . Icosapent Ethyl (VASCEPA) 1 g CAPS Take 2 capsules (2 g total) by mouth daily. 180 capsule 0  . tadalafil (CIALIS) 20 MG tablet Take 1 tablet (20 mg total) by mouth daily as needed for erectile dysfunction. 6 tablet 10   No current facility-administered medications for this visit.     Review of Systems Review of Systems  All other systems reviewed and are negative.   Blood pressure 130/83, pulse 67, temperature 97.8 F (36.6 C), height 5\' 9"  (1.753 m), weight 190 lb 12.8 oz (86.5 kg), SpO2 95 %.  Physical Exam Physical Exam Vitals signs reviewed.  Constitutional:      General: He is not in acute distress.    Appearance: Normal appearance.  HENT:     Head: Normocephalic and atraumatic.     Nose:     Comments: Covered with a mask secondary to COVID-19 precautions    Mouth/Throat:     Comments: Covered with a mask secondary to COVID-19 precautions Eyes:     General:        Right eye: No discharge.        Left eye: No discharge.     Conjunctiva/sclera: Conjunctivae normal.  Neck:     Musculoskeletal: Normal range of motion.     Comments: No thyromegaly or dominant thyroid masses appreciated. Cardiovascular:     Rate and Rhythm: Normal rate and regular rhythm.     Pulses: Normal pulses.     Heart sounds: No murmur.  Pulmonary:     Effort: Pulmonary effort is normal.     Breath sounds: Normal breath sounds.   Abdominal:     General: Abdomen is flat. Bowel sounds are normal.     Palpations: Abdomen is soft.  Genitourinary:    Comments: Deferred Musculoskeletal:     Comments: Scars and minor skin deformities from prior lower extremity operations on the right.  Lymphadenopathy:     Cervical: No cervical adenopathy.  Skin:         Comments: On the left arm, overlying the anterior aspect of the deltoid muscle, there is an approximately 1 cm well-circumscribed mass.  On palpation, it is consistent with a cutaneous structure, potentially a sebaceous cyst, less likely a lipoma, however no central pore is seen.  On the patient's back, just to the right of midline, there is a soft, fairly flat, and well-circumscribed mass approximately 5 cm in greatest dimension.  It is mobile and does feel consistent with a lipoma.  Neurological:     General: No focal deficit present.     Mental Status: He is alert and oriented to person, place, and time.  Psychiatric:        Mood and Affect: Mood normal.        Behavior: Behavior normal.     Data Reviewed I reviewed the recent imaging obtained by Ms. Pollack. CLINICAL DATA:  53 year old male patient with a superficial palpable mass superior and lateral to the left axilla, overlying the medial aspect of the proximal left humerus.  EXAM: DIGITAL DIAGNOSTIC BILATERAL MAMMOGRAM WITH CAD AND TOMO  ULTRASOUND LEFT BREAST  COMPARISON:  Previous exam(s).  ACR Breast Density Category a: The breast tissue is almost entirely fatty.  FINDINGS: No mass, architectural distortion, or suspicious microcalcification is identified to suggest malignancy in either breast. Negative for gynecomastia bilaterally.  Mammographic images were processed with CAD.  On physical exam, there is a superficial firm palpable mass superior and lateral to the left axilla, overlying the medial aspect of the proximal left humerus. The mass is not within the axilla proper.   Targeted ultrasound is performed, showing a mixed echogenicity mass within the subcutaneous fat in the region of palpable concern. The mass measures approximately 1.3 x 0.5 x 1.6 cm. No connection from the mass to the skin is identified. The mass is peripherally hypoechoic and centrally isoechoic relative to subcutaneous fat. No vascular flow is detected within the mass. The ultrasound images of the palpable mass are labeled as "left axilla" however the mass is superior and lateral to the axilla.  Ultrasound of the axilla shows normal axillary lymph nodes.  Muscle is seen immediately deep to the mass.  IMPRESSION: 1. Superficial palpable mass associated with the medial aspect of the proximal left humerus is situated within the subcutaneous fat and measures 1.3 x 0.5 x 1.6 cm. By ultrasound, this mass is nonspecific. Potential benign considerations would include resolving hematoma or fat necrosis, but the patient denies any trauma to this region. Atypical appearance of a sebaceous cyst is considered. Surgical consultation for further evaluation could be considered. 2. Negative for left axillary lymphadenopathy. 3. No mammographic evidence of malignancy or gynecomastia in either breast. Please note that the BI-RADS category below only refers to the breast imaging (not the proximal extremity mass).  I personally reviewed the images, and concur with the radiologist findings as delineated above.  Assessment The lesion on the patient's back is most consistent with a lipoma.  Although it is somewhat irritating to him, he does not find it bothersome enough to go ahead and proceed with surgical removal.  The lesion on his arm is less consistent with a lipoma, however it is mobile and seems contained within the subcutaneum.  It seems more consistent with a possible sebaceous cyst, although no central pore is visualized.  Plan For now, Mr. Dyck would prefer to avoid surgical intervention on  either lesion.  I cautioned him that the lesion on his arm could potentially become infected, and if he noticed an increase in pain, warmth, or redness, he should contact our office for further management.  In the meantime however, neither lesion seems to be of significant clinical concern.  We will see him back on an as-needed basis.    Fredirick Maudlin 06/21/2019, 11:07 AM

## 2019-07-03 ENCOUNTER — Other Ambulatory Visit: Payer: Self-pay | Admitting: Physician Assistant

## 2019-07-03 DIAGNOSIS — I10 Essential (primary) hypertension: Secondary | ICD-10-CM

## 2019-07-03 DIAGNOSIS — E781 Pure hyperglyceridemia: Secondary | ICD-10-CM

## 2019-07-05 ENCOUNTER — Encounter: Payer: Self-pay | Admitting: Physician Assistant

## 2019-07-05 ENCOUNTER — Ambulatory Visit: Payer: Self-pay | Admitting: Physician Assistant

## 2019-07-05 NOTE — Telephone Encounter (Signed)
Pt's apt is already canceled.   This is just an [place]...   Thanks,   -Mickel Baas

## 2019-08-24 ENCOUNTER — Other Ambulatory Visit: Payer: Self-pay | Admitting: Physician Assistant

## 2019-08-24 ENCOUNTER — Encounter: Payer: Self-pay | Admitting: Physician Assistant

## 2019-08-24 ENCOUNTER — Ambulatory Visit (INDEPENDENT_AMBULATORY_CARE_PROVIDER_SITE_OTHER): Payer: 59 | Admitting: Physician Assistant

## 2019-08-24 ENCOUNTER — Other Ambulatory Visit: Payer: Self-pay

## 2019-08-24 ENCOUNTER — Other Ambulatory Visit: Payer: Self-pay | Admitting: Surgery

## 2019-08-24 VITALS — BP 120/82 | HR 73 | Temp 97.7°F | Wt 185.6 lb

## 2019-08-24 DIAGNOSIS — Z0000 Encounter for general adult medical examination without abnormal findings: Secondary | ICD-10-CM

## 2019-08-24 DIAGNOSIS — R748 Abnormal levels of other serum enzymes: Secondary | ICD-10-CM

## 2019-08-24 DIAGNOSIS — E781 Pure hyperglyceridemia: Secondary | ICD-10-CM | POA: Diagnosis not present

## 2019-08-24 DIAGNOSIS — I10 Essential (primary) hypertension: Secondary | ICD-10-CM | POA: Diagnosis not present

## 2019-08-24 NOTE — Progress Notes (Signed)
Patient: Taylor Medina Male    DOB: Jun 16, 1966   53 y.o.   MRN: [id number] Visit Date: 08/24/2019  Today's Provider: Trinna Post, PA-C   Chief Complaint  Patient presents with  . Hypertension   Subjective:      CPE  In the interim, his daughter has died unexpectedly.   Colonoscopy 12/10/2017 with multiple polyps. Brother passed of colon cancer. Return in 5 years per instructions.  No family history of prostate cancer. Guillain Barre to Flu shots.   HPI  Hypertension, follow-up:  BP Readings from Last 3 Encounters:  08/24/19 120/82  06/21/19 130/83  05/19/19 121/84    He was last seen for hypertension 3 months ago.  BP at that visit was 121/84. Management changes since that visit include continue Amlodipine 10 MG daily. He reports good compliance with treatment. He is not having side effects.  He is exercising. He is adherent to low salt diet.   Outside blood pressures are: yesterday 120/80. He is experiencing none.  Patient denies chest pain, chest pressure/discomfort, exertional chest pressure/discomfort and fatigue.   Cardiovascular risk factors include hypertension and smoking/ tobacco exposure.  Use of agents associated with hypertension: none.    Weight Loss: has been waiting between plates   Weight trend: decreasing steadily Wt Readings from Last 3 Encounters:  08/24/19 185 lb 9.6 oz (84.2 kg)  06/21/19 190 lb 12.8 oz (86.5 kg)  05/19/19 190 lb (86.2 kg)    Current diet: well balanced  Elevated Liver Enzymes: Previously was drinking 1 bottle of wine per night. Now is drinking 1/2 bottle wine per night.   CMP Latest Ref Rng & Units 05/19/2019 04/07/2019 03/03/2016  Glucose 65 - 99 mg/dL 102(H) 101(H) 88  BUN 6 - 24 mg/dL 12 14 12   Creatinine 0.76 - 1.27 mg/dL 0.90 0.91 1.02  Sodium 134 - 144 mmol/L 138 137 138  Potassium 3.5 - 5.2 mmol/L 4.4 4.4 4.9  Chloride 96 - 106 mmol/L 103 100 97  CO2 20 - 29 mmol/L 21 19(L) 20  Calcium 8.7 - 10.2  mg/dL 9.2 9.8 9.2  Total Protein 6.0 - 8.5 g/dL 6.7 7.2 7.1  Total Bilirubin 0.0 - 1.2 mg/dL 0.6 0.5 0.4  Alkaline Phos 39 - 117 IU/L 67 68 70  AST 0 - 40 IU/L 101(H) 78(H) 50(H)  ALT 0 - 44 IU/L 177(H) 142(H) 121(H)    HLD: Was started on Vascepa 2g QD last visit and is taking this without issue.   Lipid Panel     Component Value Date/Time   CHOL 186 05/19/2019 0845   TRIG 485 (H) 05/19/2019 0845   HDL 26 (L) 05/19/2019 0845   CHOLHDL 7.2 (H) 05/19/2019 0845   LDLCALC Comment 05/19/2019 0845   LDLDIRECT 99 04/07/2019 0839   LABVLDL Comment 05/19/2019 0845    ------------------------------------------------------------------------     Allergies  Allergen Reactions  . Influenza Vaccines     Guillain barre syndrome     Current Outpatient Medications:  .  amLODipine (NORVASC) 10 MG tablet, TAKE 1 TABLET BY MOUTH EVERY DAY, Disp: 90 tablet, Rfl: 0 .  diazepam (VALIUM) 5 MG tablet, One every 8 hours as needed for chest discomfort/stress, Disp: 30 tablet, Rfl: 1 .  diclofenac sodium (VOLTAREN) 1 % GEL, Apply topically., Disp: , Rfl:  .  gabapentin (NEURONTIN) 300 MG capsule, Take 1 capsule (300 mg total) by mouth 3 (three) times daily., Disp: 90 capsule, Rfl: 0 .  tadalafil (  CIALIS) 20 MG tablet, Take 1 tablet (20 mg total) by mouth daily as needed for erectile dysfunction., Disp: 6 tablet, Rfl: 10 .  VASCEPA 1 g CAPS, TAKE 2 CAPSULES (2 G TOTAL) BY MOUTH DAILY., Disp: 180 capsule, Rfl: 0  Review of Systems  Constitutional: Negative.   Respiratory: Negative.   Neurological: Negative.     Social History   Tobacco Use  . Smoking status: Former Smoker    Types: Cigarettes    Quit date: 07/28/2013    Years since quitting: 6.0  . Smokeless tobacco: Never Used  Substance Use Topics  . Alcohol use: Yes    Alcohol/week: 9.0 standard drinks    Types: 9 Glasses of wine per week      Objective:   BP 120/82 (BP Location: Right Arm, Patient Position: Sitting, Cuff Size:  Normal)   Pulse 73   Temp 97.7 F (36.5 C) (Temporal)   Wt 185 lb 9.6 oz (84.2 kg)   BMI 27.41 kg/m  Vitals:   08/24/19 0815  BP: 120/82  Pulse: 73  Temp: 97.7 F (36.5 C)  TempSrc: Temporal  Weight: 185 lb 9.6 oz (84.2 kg)  Body mass index is 27.41 kg/m.   Physical Exam Constitutional:      Appearance: Normal appearance.  Cardiovascular:     Rate and Rhythm: Normal rate and regular rhythm.     Heart sounds: Normal heart sounds.  Pulmonary:     Effort: Pulmonary effort is normal.     Breath sounds: Normal breath sounds.  Skin:    General: Skin is warm and dry.  Neurological:     Mental Status: He is alert and oriented to person, place, and time. Mental status is at baseline.  Psychiatric:        Behavior: Behavior normal.      No results found for any visits on 08/24/19.     Assessment & Plan    1. Annual physical exam  Counseled that we have counselor in clinic and also [facility] now [employer] [facility], offers free grief counseling.   2. Hypertriglyceridemia  Recheck after starting Vascepa. Suspect this is in part due to alcohol intake. He has also reduced portion size and lost five pounds.   - Lipid Profile  3. Elevated liver enzymes  This has been persistent x 4 years. Hepatitis testing negative. Suspect this is due to alcohol intake and he has been advised to stop all alcohol use. He continues to drink 1/2 bottle wine per night. [place] 05/2019 showed fatty liver and no other acute findings. We will follow up pending lab results.   - Comprehensive Metabolic Panel (CMET)  4. HYPERTENSION, BENIGN  Stable, continue amlodipine.   The entirety of the information documented in the History of Present Illness, Review of Systems and Physical Exam were personally obtained by me. Portions of this information were initially documented by [facility], CMA and reviewed by me for thoroughness and accuracy.           Trinna Post,  PA-C  [facility]

## 2019-08-24 NOTE — Patient Instructions (Signed)

## 2019-08-25 LAB — COMPREHENSIVE METABOLIC PANEL
ALT: 146 IU/L — ABNORMAL HIGH (ref 0–44)
AST: 79 IU/L — ABNORMAL HIGH (ref 0–40)
Albumin/Globulin Ratio: 1.6 (ref 1.2–2.2)
Albumin: 4.1 g/dL (ref 3.8–4.9)
Alkaline Phosphatase: 73 IU/L (ref 39–117)
BUN/Creatinine Ratio: 18 (ref 9–20)
BUN: 16 mg/dL (ref 6–24)
Bilirubin Total: 0.6 mg/dL (ref 0.0–1.2)
CO2: 20 mmol/L (ref 20–29)
Calcium: 9.6 mg/dL (ref 8.7–10.2)
Chloride: 103 mmol/L (ref 96–106)
Creatinine, Ser: 0.91 mg/dL (ref 0.76–1.27)
GFR calc Af Amer: 112 mL/min/{1.73_m2} (ref >59)
GFR calc non Af Amer: 97 mL/min/{1.73_m2} (ref >59)
Globulin, Total: 2.6 g/dL (ref 1.5–4.5)
Glucose: 98 mg/dL (ref 65–99)
Potassium: 4.5 mmol/L (ref 3.5–5.2)
Sodium: 139 mmol/L (ref 134–144)
Total Protein: 6.7 g/dL (ref 6.0–8.5)

## 2019-08-25 LAB — LIPID PANEL
Chol/HDL Ratio: 7.1 ratio — ABNORMAL HIGH (ref 0.0–5.0)
Cholesterol, Total: 227 mg/dL — ABNORMAL HIGH (ref 100–199)
HDL: 32 mg/dL — ABNORMAL LOW (ref >39)
LDL Chol Calc (NIH): 98 mg/dL (ref 0–99)
Triglycerides: 573 mg/dL (ref 0–149)
VLDL Cholesterol Cal: 97 mg/dL — ABNORMAL HIGH (ref 5–40)

## 2019-08-26 ENCOUNTER — Encounter: Payer: Self-pay | Admitting: Physician Assistant

## 2019-08-26 DIAGNOSIS — E781 Pure hyperglyceridemia: Secondary | ICD-10-CM

## 2019-08-26 MED ORDER — VASCEPA 1 G PO CAPS: 2.0000 | ORAL_CAPSULE | Freq: Two times a day (BID) | ORAL | 1 refills | Status: DC

## 2019-08-29 NOTE — Progress Notes (Signed)
08/30/2019 9:07 AM   Taylor Medina [birth year] [id number]  Referring provider: Trinna Post, PA-C [address] [place],  [place] [address]  Chief Complaint  Patient presents with  . Erectile Dysfunction    HPI: Taylor Medina is a 53 year old male with ED who presents today for a medication refill.  Erectile dysfunction SHIM score: 14    Previous SHIM score: 14 Main complaint: Lack of firmness x several years Risk factors:  age, BPH, HTN, HLD and sleep apnea.     No painful erections or curvatures with his erections.    No longer having spontaneous erections without Cialis  Tried: Cialis 20 mg with good results   SHIM    Row Name 08/30/19 0854         SHIM: Over the last 6 months:   How do you rate your confidence that you could get and keep an erection?  Low     When you had erections with sexual stimulation, how often were your erections hard enough for penetration (entering your partner)?  Sometimes (about half the time)     During sexual intercourse, how often were you able to maintain your erection after you had penetrated (entered) your partner?  Sometimes (about half the time)     During sexual intercourse, how difficult was it to maintain your erection to completion of intercourse?  Difficult     When you attempted sexual intercourse, how often was it satisfactory for you?  Sometimes (about half the time)       SHIM Total Score   SHIM  14        Score: 1-7 Severe ED 8-11 Moderate ED 12-16 Mild-Moderate ED 17-21 Mild ED 22-25 No ED  BPH WITH LUTS  (prostate and/or bladder) Major complaint(s): Hesitancy x several months. Denies any dysuria, hematuria or suprapubic pain.  Denies any recent fevers, chills, nausea or vomiting.  PMH: Past Medical History:  Diagnosis Date  . Diverticulosis   . HTN (hypertension)    episodic   . Palpitations     Surgical History: Past Surgical History:  Procedure Laterality Date  . COLONOSCOPY WITH  PROPOFOL N/A 12/10/2017   Procedure: COLONOSCOPY WITH PROPOFOL;  Surgeon: Virgel Manifold, MD;  Location: [facility] END[facility]COPY;  Service: Endoscopy;  Laterality: N/A;  . ESOPHAGOGASTRODUODENOSCOPY (EGD) WITH PROPOFOL  12/10/2017   Procedure: ESOPHAGOGASTRODUODENOSCOPY (EGD) WITH PROPOFOL;  Surgeon: Virgel Manifold, MD;  Location: [facility] ENDOSCOPY;  Service: Endoscopy;;  . FRACTURE SURGERY    . hardware fractions     on R; several surgeries   . hernia repair surgery      Home Medications:  Allergies as of 08/30/2019      Reactions   Influenza Vaccines    Guillain barre syndrome      Medication List       Accurate as of August 30, 2019  9:07 AM. If you have any questions, ask your nurse or doctor.        STOP taking these medications   diazepam 5 MG tablet Commonly known as: VALIUM Stopped by: Aundre Hietala, PA-C     TAKE these medications   amLODipine 10 MG tablet Commonly known as: NORVASC TAKE 1 TABLET BY MOUTH EVERY DAY   diclofenac sodium 1 % Gel Commonly known as: VOLTAREN Apply topically.   gabapentin 300 MG capsule Commonly known as: NEURONTIN Take 1 capsule (300 mg total) by mouth 3 (three) times daily.   tadalafil 20 MG tablet  Commonly known as: CIALIS Take 1 tablet (20 mg total) by mouth daily as needed for erectile dysfunction.   Vascepa 1 g capsule Generic drug: icosapent Ethyl Take 2 capsules (2 g total) by mouth 2 (two) times daily.       Allergies:  Allergies  Allergen Reactions  . Influenza Vaccines     Guillain barre syndrome    Family History: Family History  Problem Relation Age of Onset  . Cancer Other        family hx  . Coronary artery disease Other        family hx  . Diabetes Other        family hx    Social History:  reports that he quit smoking about 6 years ago. His smoking use included cigarettes. He has never used smokeless tobacco. He reports current alcohol use of about 9.0 standard drinks of alcohol per week.  He reports that he does not use drugs.  ROS: UROLOGY Frequent Urination?: No Hard to postpone urination?: No Burning/pain with urination?: No Get up at night to urinate?: No Leakage of urine?: No Urine stream starts and stops?: No Trouble starting stream?: No Do you have to strain to urinate?: No Blood in urine?: No Urinary tract infection?: No Sexually transmitted disease?: No Injury to kidneys or bladder?: No Painful intercourse?: No Weak stream?: No Erection problems?: No Penile pain?: No  Gastrointestinal Nausea?: No Vomiting?: No Indigestion/heartburn?: No Diarrhea?: No Constipation?: No  Constitutional Fever: No Night sweats?: No Weight loss?: No Fatigue?: No  Skin Skin rash/lesions?: No Itching?: No  Eyes Blurred vision?: No Double vision?: No  Ears/Nose/Throat Sore throat?: No Sinus problems?: No  Hematologic/Lymphatic Swollen glands?: No Easy bruising?: No  Cardiovascular Leg swelling?: No Chest pain?: No  Respiratory Cough?: No Shortness of breath?: No  Endocrine Excessive thirst?: No  Musculoskeletal Back pain?: No Joint pain?: No  Neurological Headaches?: No Dizziness?: No  Psychologic Depression?: No Anxiety?: No  Physical Exam: BP 122/79   Pulse 63   Ht 5\' 9"  (1.753 m)   Wt 185 lb (83.9 kg)   BMI 27.32 kg/m   Constitutional:  Well nourished. Alert and oriented, No acute distress. HEENT: [place] AT, moist mucus membranes.  Trachea midline, no masses. Cardiovascular: No clubbing, cyanosis, or edema. Respiratory: Normal respiratory effort, no increased work of breathing. GI: Abdomen is soft, non tender, non distended, no abdominal masses. Liver and spleen not palpable.  No hernias appreciated.  Stool sample for occult testing is not indicated.   GU: No CVA tenderness.  No bladder fullness or masses.  Patient with circumcised phallus.  Urethral meatus is patent.  No penile discharge. No penile lesions or rashes. Scrotum without  lesions, cysts, rashes and/or edema.  Testicles are located scrotally bilaterally. No masses are appreciated in the testicles. Left and right epididymis are normal. Rectal: Patient with  normal sphincter tone. Anus and perineum without scarring or rashes. No rectal masses are appreciated. Prostate is approximately 50  grams, no nodules are appreciated. Seminal vesicles could not be palpated  Skin: No rashes, bruises or suspicious lesions. Lymph: No inguinal adenopathy. Neurologic: Grossly intact, no focal deficits, moving all 4 extremities. Psychiatric: Normal mood and affect.  Laboratory Data: Lab Results  Component Value Date   WBC 6.1 04/07/2019   HGB 18.3 (H) 04/07/2019   HCT 53.0 (H) 04/07/2019   MCV 95 04/07/2019   PLT 203 04/07/2019    Lab Results  Component Value Date   CREATININE 0.91  08/24/2019   Lab Results  Component Value Date   TESTOSTERONE 305 08/08/2016     Assessment & Plan:    1. Erectile dysfunction SHIM score is 14, it is stable Continue Cialis 20 mg, prn for intercourse RTC in 12 months for repeat SHIM score and exam   2. BPH with LUTS Continue conservative management, avoiding bladder irritants and timed voiding's Most bothersome symptoms is/are hesitancy, but is not wanting to pursue therapy for this PSA pending RTC in 12 months for IPSS, PSA,and exam   Zara Council, PA-C  [facility] [address], [place], [place] [address] [phone]

## 2019-08-30 ENCOUNTER — Other Ambulatory Visit: Payer: Self-pay | Admitting: Urology

## 2019-08-30 ENCOUNTER — Other Ambulatory Visit: Payer: Self-pay

## 2019-08-30 ENCOUNTER — Ambulatory Visit (INDEPENDENT_AMBULATORY_CARE_PROVIDER_SITE_OTHER): Payer: 59 | Admitting: Urology

## 2019-08-30 ENCOUNTER — Encounter: Payer: Self-pay | Admitting: Urology

## 2019-08-30 VITALS — BP 122/79 | HR 63 | Ht 69.0 in | Wt 185.0 lb

## 2019-08-30 DIAGNOSIS — N401 Enlarged prostate with lower urinary tract symptoms: Secondary | ICD-10-CM | POA: Diagnosis not present

## 2019-08-30 DIAGNOSIS — N5201 Erectile dysfunction due to arterial insufficiency: Secondary | ICD-10-CM | POA: Diagnosis not present

## 2019-08-30 DIAGNOSIS — N138 Other obstructive and reflux uropathy: Secondary | ICD-10-CM

## 2019-08-30 MED ORDER — TADALAFIL 20 MG PO TABS: 20.0000 mg | ORAL_TABLET | Freq: Every day | ORAL | 6 refills | Status: DC | PRN

## 2019-08-31 ENCOUNTER — Other Ambulatory Visit: Payer: Self-pay

## 2019-08-31 ENCOUNTER — Encounter: Payer: Self-pay | Admitting: *Deleted

## 2019-08-31 LAB — PSA: Prostate Specific Ag, Serum: 0.4 ng/mL (ref 0.0–4.0)

## 2019-09-02 ENCOUNTER — Other Ambulatory Visit: Payer: Self-pay

## 2019-09-02 ENCOUNTER — Other Ambulatory Visit
Admission: RE | Admit: 2019-09-02 | Discharge: 2019-09-02 | Disposition: A | Discharge status: Home / Self Care | Payer: 59 | Source: Ambulatory Visit | Attending: Surgery | Admitting: Surgery

## 2019-09-02 DIAGNOSIS — Z01812 Encounter for preprocedural laboratory examination: Secondary | ICD-10-CM | POA: Insufficient documentation

## 2019-09-02 DIAGNOSIS — Z20828 Contact with and (suspected) exposure to other viral communicable diseases: Secondary | ICD-10-CM | POA: Diagnosis not present

## 2019-09-03 LAB — SARS CORONAVIRUS 2 (TAT 6-24 HRS): SARS Coronavirus 2: NEGATIVE

## 2019-09-04 ENCOUNTER — Encounter: Payer: Self-pay | Admitting: Emergency Medicine

## 2019-09-04 ENCOUNTER — Other Ambulatory Visit: Payer: Self-pay

## 2019-09-04 ENCOUNTER — Emergency Department: Payer: 59

## 2019-09-04 ENCOUNTER — Emergency Department
Admission: EM | Admit: 2019-09-04 | Discharge: 2019-09-04 | Disposition: A | Discharge status: Home / Self Care | Payer: 59 | Attending: Student in an Organized Health Care Education/Training Program | Admitting: Student in an Organized Health Care Education/Training Program

## 2019-09-04 DIAGNOSIS — Y999 Unspecified external cause status: Secondary | ICD-10-CM | POA: Diagnosis not present

## 2019-09-04 DIAGNOSIS — W010XXA Fall on same level from slipping, tripping and stumbling without subsequent striking against object, initial encounter: Secondary | ICD-10-CM | POA: Diagnosis not present

## 2019-09-04 DIAGNOSIS — M25512 Pain in left shoulder: Secondary | ICD-10-CM

## 2019-09-04 DIAGNOSIS — Y92007 Garden or yard of unspecified non-institutional (private) residence as the place of occurrence of the external cause: Secondary | ICD-10-CM | POA: Insufficient documentation

## 2019-09-04 DIAGNOSIS — Z79899 Other long term (current) drug therapy: Secondary | ICD-10-CM | POA: Diagnosis not present

## 2019-09-04 DIAGNOSIS — S43102A Unspecified dislocation of left acromioclavicular joint, initial encounter: Secondary | ICD-10-CM | POA: Diagnosis not present

## 2019-09-04 DIAGNOSIS — F17210 Nicotine dependence, cigarettes, uncomplicated: Secondary | ICD-10-CM | POA: Diagnosis not present

## 2019-09-04 DIAGNOSIS — I10 Essential (primary) hypertension: Secondary | ICD-10-CM | POA: Diagnosis not present

## 2019-09-04 DIAGNOSIS — S46912A Strain of unspecified muscle, fascia and tendon at shoulder and upper arm level, left arm, initial encounter: Secondary | ICD-10-CM | POA: Diagnosis not present

## 2019-09-04 DIAGNOSIS — S4992XA Unspecified injury of left shoulder and upper arm, initial encounter: Secondary | ICD-10-CM | POA: Diagnosis present

## 2019-09-04 DIAGNOSIS — Y93H2 Activity, gardening and landscaping: Secondary | ICD-10-CM | POA: Diagnosis not present

## 2019-09-04 MED ORDER — HYDROCODONE-ACETAMINOPHEN 5-325 MG PO TABS
1.0000 | ORAL_TABLET | Freq: Once | ORAL | Status: AC
  Administered 2019-09-04: 1 via ORAL
  Filled 2019-09-04: qty 1

## 2019-09-04 MED ORDER — HYDROCODONE-ACETAMINOPHEN 5-325 MG PO TABS: 1.0000 | ORAL_TABLET | Freq: Three times a day (TID) | ORAL | 0 refills | Status: DC | PRN

## 2019-09-04 MED ORDER — CYCLOBENZAPRINE HCL 10 MG PO TABS
10.0000 mg | ORAL_TABLET | Freq: Once | ORAL | Status: AC
  Administered 2019-09-04: 10 mg via ORAL
  Filled 2019-09-04: qty 1

## 2019-09-04 MED ORDER — CYCLOBENZAPRINE HCL 5 MG PO TABS: 5.0000 mg | ORAL_TABLET | Freq: Three times a day (TID) | ORAL | 0 refills | Status: AC | PRN

## 2019-09-04 NOTE — ED Provider Notes (Signed)
[facility] Emergency Department Provider Note ____________________________________________  Time seen: 1145  I have reviewed the triage vital signs and the nursing notes.  HISTORY  Chief Complaint  Fall and Shoulder Pain  HPI Taylor Medina is a 53 y.o. male presents to the ED for evaluation of shoulder pain following a mechanical fall.  Patient describes he was at home this evening, pulling brush backwards of the hill towards a burn pile.  He apparently slipped on wet grass, and landed with backwards outstretched arms.  He describes immediate pain and disability to the left shoulder.  He denies any head injury, loss of consciousness, nausea, vomiting, dizziness.  He denies any other injury at this time.  He took ibuprofen this morning prior to arrival.  He presents now for acute pain and disability to the left shoulder.   Past Medical History:  Diagnosis Date  . Diverticulosis   . HTN (hypertension)    episodic   . Palpitations   . Sleep apnea    CPAP  . Wears contact lenses     Patient Active Problem List   Diagnosis Date Noted  . Esophageal dysphagia   . Thickening of esophagus   . Reflux esophagitis   . Stomach irritation   . FH: colon cancer   . Special screening for malignant neoplasms, colon   . Benign neoplasm of ascending colon   . External hemorrhoids   . Benign neoplasm of descending colon   . OSA (obstructive sleep apnea) 10/24/2016  . Hypertriglyceridemia 12/24/2009  . HYPERTENSION, BENIGN 12/24/2009    Past Surgical History:  Procedure Laterality Date  . COLONOSCOPY WITH PROPOFOL N/A 12/10/2017   Procedure: COLONOSCOPY WITH PROPOFOL;  Surgeon: Virgel Manifold, MD;  Location: [facility] ENDOSCOPY;  Service: Endoscopy;  Laterality: N/A;  . ESOPHAGOGASTRODUODENOSCOPY (EGD) WITH PROPOFOL  12/10/2017   Procedure: ESOPHAGOGASTRODUODENOSCOPY (EGD) WITH PROPOFOL;  Surgeon: Virgel Manifold, MD;  Location: [facility];  Service:  Endoscopy;;  . FRACTURE SURGERY  2005   Rod placed.  Tib/fib fracture  . hardware fractions     on R; several surgeries   . hernia repair surgery      Prior to Admission medications   Medication Sig Start Date End Date Taking? Authorizing Provider  amLODipine (NORVASC) 10 MG tablet TAKE 1 TABLET BY MOUTH EVERY DAY 07/05/19   Trinna Post, PA-C  cyclobenzaprine (FLEXERIL) 5 MG tablet Take 1 tablet (5 mg total) by mouth 3 (three) times daily as needed for up to 3 days. 09/04/19 09/07/19  Noelly Lasseigne, Dannielle Karvonen, PA-C  diclofenac sodium (VOLTAREN) 1 % GEL Apply topically. 11/02/18 11/02/19  [provider]  gabapentin (NEURONTIN) 300 MG capsule Take 1 capsule (300 mg total) by mouth 3 (three) times daily. 06/06/19   Trinna Post, PA-C  HYDROcodone-acetaminophen (NORCO) 5-325 MG tablet Take 1 tablet by mouth 3 (three) times daily as needed. 09/04/19   Travius Crochet, Dannielle Karvonen, PA-C  Icosapent Ethyl (VASCEPA) 1 g CAPS Take 2 capsules (2 g total) by mouth 2 (two) times daily. 08/26/19 11/24/19  Trinna Post, PA-C  tadalafil (CIALIS) 20 MG tablet Take 1 tablet (20 mg total) by mouth daily as needed for erectile dysfunction. 08/30/19   Zara Council A, PA-C  sildenafil (VIAGRA) 100 MG tablet Take 1 tablet (100 mg total) by mouth daily as needed for erectile dysfunction. Take two hours prior to intercourse on an empty stomach 07/28/16 07/28/16  Zara Council A, PA-C    Allergies Influenza  vaccines  Family History  Problem Relation Age of Onset  . Cancer Other        family hx  . Coronary artery disease Other        family hx  . Diabetes Other        family hx    Social History Social History   Tobacco Use  . Smoking status: Current Every Day Smoker    Packs/day: 0.50    Types: Cigarettes    Last attempt to quit: 07/28/2013    Years since quitting: 6.1  . Smokeless tobacco: Never Used  . Tobacco comment: smoked for approx 12 yrs. quit for 3. Restarted several  months ago  Substance Use Topics  . Alcohol use: Yes    Alcohol/week: 12.0 standard drinks    Types: 12 Glasses of wine per week  . Drug use: No    Review of Systems  Constitutional: Negative for fever. Cardiovascular: Negative for chest pain. Respiratory: Negative for shortness of breath. Musculoskeletal: Negative for back pain. Left shoulder pain and disability Skin: Negative for rash. Neurological: Negative for headaches, focal weakness or numbness. ____________________________________________  PHYSICAL EXAM:  VITAL SIGNS: ED Triage Vitals  Enc Vitals Group     BP 09/04/19 1130 (!) 142/96     Pulse Rate 09/04/19 1130 89     Resp 09/04/19 1130 16     Temp 09/04/19 1130 98.2 F (36.8 C)     Temp Source 09/04/19 1130 Oral     SpO2 09/04/19 1130 94 %     Weight 09/04/19 1133 185 lb (83.9 kg)     Height 09/04/19 1133 5\' 9"  (1.753 m)     Head Circumference --      Peak Flow --      Pain Score 09/04/19 1132 9     Pain Loc --      Pain Edu? --      Excl. in [place]? --     Constitutional: Alert and oriented. Well appearing and in no distress. Head: Normocephalic and atraumatic. Eyes: Conjunctivae are normal. Normal extraocular movements Neck: Supple. Normal ROM. No midline tenderness.  Cardiovascular: Normal rate, regular rhythm. Normal distal pulses and cap refill. Respiratory: Normal respiratory effort. No wheezes/rales/rhonchi. Gastrointestinal: Soft and nontender. No distention. Musculoskeletal: left shoulder without deformity, dislocation, or sulcus sign. Tenderness to palp over the [facility] joint.  Normal composite fists. Nontender with normal range of motion in all extremities.  Neurologic: Normal gross sensation.  Normal gait without ataxia. Normal speech and language. No gross focal neurologic deficits are appreciated. Skin:  Skin is warm, dry and intact. No rash noted. Psychiatric: Mood and affect are normal. Patient exhibits appropriate insight and  judgment. ____________________________________________   RADIOLOGY  DG Left Shoulder IMPRESSION: Negative.  I, Melvenia Needles, personally viewed and evaluated these images (plain radiographs) as part of my medical decision making, as well as reviewing the written report by the radiologist. ____________________________________________  PROCEDURES  Norco 5-325 mg PO Flexeril 10 mg PO Arm sling Procedures ____________________________________________  INITIAL IMPRESSION / ASSESSMENT AND PLAN / ED COURSE  Patient with ED evaluation of pain and disability to the left elbow.  Patient describes mechanical fall last night, falling backwards on outstretched hand.  He presents today with pain and disability and discomfort localized to the [facility] joint.  No fracture, dislocation, or sulcus sign is seen on exam or plain films.  There does appear to be some separation of the [facility] joint without elevation.  Patient is  placed in a sling for comfort, and is discharged with prescriptions for cyclobenzaprine and hydrocodone.  He will follow-up with Ortho for further management of his symptoms.  Taylor Medina was evaluated in Emergency Department on 09/04/2019 for the symptoms described in the history of present illness. He was evaluated in the context of the global COVID-19 pandemic, which necessitated consideration that the patient might be at risk for infection with the SARS-CoV-2 virus that causes COVID-19. Institutional protocols and algorithms that pertain to the evaluation of patients at risk for COVID-19 are in a state of rapid change based on information released by regulatory bodies including the CDC and federal and state organizations. These policies and algorithms were followed during the patient's care in the ED.  I reviewed the patient's prescription history over the last 12 months in the multi-state controlled substances database(s) that includes [place], [place], [place], [place], [place],  [place], [place], [place], [place], [place], [place], [place], [place], and [place].  Results were notable for no current RX. ____________________________________________  FINAL CLINICAL IMPRESSION(S) / ED DIAGNOSES  Final diagnoses:  Acute pain of left shoulder  Strain of left shoulder, initial encounter  AC separation, left, initial encounter      Melvenia Needles, PA-C 09/04/19 1827    Merlyn Lot, MD 09/05/19 [id number]

## 2019-09-04 NOTE — ED Triage Notes (Signed)
Pt to ED via POV, pt states that he slipped and fell backwards last night. Pt is having pain in his left shoulder. Pt states that he is able to move his shoulder but it increases the pain when he does. Pt states that it feels like he broke something. Pt is in NAD.

## 2019-09-04 NOTE — ED Notes (Signed)
NAD noted at time of D/C. Pt denies questions or concerns. Pt taken to the lobby via wheelchair at this time.  

## 2019-09-04 NOTE — Discharge Instructions (Signed)
You are being treated for a shoulder strain and AC joint separation. Take the prescription meds as directed. Apply ice to reduce pain and swelling. Follow-up with Dr. Roland Rack for ongoing management.

## 2019-09-07 ENCOUNTER — Ambulatory Visit: Payer: 59 | Admitting: Anesthesiology

## 2019-09-07 ENCOUNTER — Encounter
Admission: RE | Disposition: A | Discharge status: Home / Self Care | Payer: Self-pay | Source: Home / Self Care | Attending: Surgery

## 2019-09-07 ENCOUNTER — Other Ambulatory Visit: Payer: Self-pay

## 2019-09-07 ENCOUNTER — Ambulatory Visit
Admission: RE | Admit: 2019-09-07 | Discharge: 2019-09-07 | Disposition: A | Discharge status: Home / Self Care | Payer: 59 | Attending: Surgery | Admitting: Surgery

## 2019-09-07 DIAGNOSIS — M720 Palmar fascial fibromatosis [Dupuytren]: Secondary | ICD-10-CM | POA: Insufficient documentation

## 2019-09-07 DIAGNOSIS — Z887 Allergy status to serum and vaccine status: Secondary | ICD-10-CM | POA: Insufficient documentation

## 2019-09-07 DIAGNOSIS — Z87891 Personal history of nicotine dependence: Secondary | ICD-10-CM | POA: Diagnosis not present

## 2019-09-07 DIAGNOSIS — Z79899 Other long term (current) drug therapy: Secondary | ICD-10-CM | POA: Insufficient documentation

## 2019-09-07 DIAGNOSIS — M791 Myalgia, unspecified site: Secondary | ICD-10-CM | POA: Insufficient documentation

## 2019-09-07 HISTORY — DX: Presence of spectacles and contact lenses: Z97.3

## 2019-09-07 HISTORY — DX: Sleep apnea, unspecified: G47.30

## 2019-09-07 HISTORY — PX: DUPUYTREN CONTRACTURE RELEASE: SHX1478

## 2019-09-07 SURGERY — RELEASE, DUPUYTREN CONTRACTURE
Anesthesia: Regional | Site: Little Finger | Laterality: Left

## 2019-09-07 MED ORDER — BUPIVACAINE HCL (PF) 0.5 % IJ SOLN
INTRAMUSCULAR | Status: DC | PRN
  Administered 2019-09-07: 10 mL

## 2019-09-07 MED ORDER — OXYCODONE HCL 5 MG/5ML PO SOLN: 5.0000 mg | Freq: Once | ORAL | Status: AC | PRN

## 2019-09-07 MED ORDER — FENTANYL CITRATE (PF) 100 MCG/2ML IJ SOLN
INTRAMUSCULAR | Status: DC | PRN
  Administered 2019-09-07: 100 ug via INTRAVENOUS

## 2019-09-07 MED ORDER — ONDANSETRON HCL 4 MG/2ML IJ SOLN: 4.0000 mg | Freq: Four times a day (QID) | INTRAMUSCULAR | Status: DC | PRN

## 2019-09-07 MED ORDER — MIDAZOLAM HCL 5 MG/5ML IJ SOLN
INTRAMUSCULAR | Status: DC | PRN
  Administered 2019-09-07: 2 mg via INTRAVENOUS

## 2019-09-07 MED ORDER — ONDANSETRON HCL 4 MG/2ML IJ SOLN
INTRAMUSCULAR | Status: DC | PRN
  Administered 2019-09-07: 4 mg via INTRAVENOUS

## 2019-09-07 MED ORDER — PROPOFOL 500 MG/50ML IV EMUL
INTRAVENOUS | Status: DC | PRN
  Administered 2019-09-07: 75 ug/kg/min via INTRAVENOUS

## 2019-09-07 MED ORDER — OXYCODONE HCL 5 MG PO TABS
5.0000 mg | ORAL_TABLET | Freq: Once | ORAL | Status: AC | PRN
  Administered 2019-09-07: 5 mg via ORAL

## 2019-09-07 MED ORDER — FENTANYL CITRATE (PF) 100 MCG/2ML IJ SOLN: 25.0000 ug | INTRAMUSCULAR | Status: DC | PRN

## 2019-09-07 MED ORDER — METOCLOPRAMIDE HCL 5 MG PO TABS: 5.0000 mg | ORAL_TABLET | Freq: Three times a day (TID) | ORAL | Status: DC | PRN

## 2019-09-07 MED ORDER — ONDANSETRON HCL 4 MG PO TABS: 4.0000 mg | ORAL_TABLET | Freq: Four times a day (QID) | ORAL | Status: DC | PRN

## 2019-09-07 MED ORDER — METOCLOPRAMIDE HCL 5 MG/ML IJ SOLN: 5.0000 mg | Freq: Three times a day (TID) | INTRAMUSCULAR | Status: DC | PRN

## 2019-09-07 MED ORDER — HYDROCODONE-ACETAMINOPHEN 5-325 MG PO TABS: 1.0000 | ORAL_TABLET | Freq: Four times a day (QID) | ORAL | 0 refills | Status: DC | PRN

## 2019-09-07 MED ORDER — LACTATED RINGERS IV SOLN
INTRAVENOUS | Status: DC
  Administered 2019-09-07: 13:00:00 via INTRAVENOUS

## 2019-09-07 MED ORDER — CEFAZOLIN SODIUM-DEXTROSE 2-4 GM/100ML-% IV SOLN
2.0000 g | INTRAVENOUS | Status: AC
  Administered 2019-09-07: 2 g via INTRAVENOUS

## 2019-09-07 MED ORDER — LACTATED RINGERS IV SOLN
INTRAVENOUS | Status: DC
  Administered 2019-09-07 (×2): via INTRAVENOUS

## 2019-09-07 MED ORDER — ONDANSETRON HCL 4 MG/2ML IJ SOLN: 4.0000 mg | Freq: Once | INTRAMUSCULAR | Status: DC | PRN

## 2019-09-07 MED ORDER — POTASSIUM CHLORIDE IN NACL 20-0.9 MEQ/L-% IV SOLN: INTRAVENOUS | Status: DC

## 2019-09-07 MED ORDER — CHLORHEXIDINE GLUCONATE 4 % EX LIQD
60.0000 mL | Freq: Once | CUTANEOUS | Status: AC
  Administered 2019-09-07: 4 via TOPICAL

## 2019-09-07 MED ORDER — KETOROLAC TROMETHAMINE 30 MG/ML IJ SOLN
30.0000 mg | Freq: Once | INTRAMUSCULAR | Status: AC | PRN
  Administered 2019-09-07: 30 mg via INTRAVENOUS

## 2019-09-07 MED ORDER — LIDOCAINE HCL (CARDIAC) PF 100 MG/5ML IV SOSY
PREFILLED_SYRINGE | INTRAVENOUS | Status: DC | PRN
  Administered 2019-09-07: 40 mg via INTRAVENOUS

## 2019-09-07 MED ORDER — HYDROCODONE-ACETAMINOPHEN 5-325 MG PO TABS: 1.0000 | ORAL_TABLET | ORAL | Status: DC | PRN

## 2019-09-07 SURGICAL SUPPLY — 27 items
BLADE MINI RND TIP GREEN BEAV (BLADE) ×1 IMPLANT
BNDG COHESIVE 4X5 TAN STRL (GAUZE/BANDAGES/DRESSINGS) ×2 IMPLANT
BNDG ELASTIC 2X5.8 VLCR NS LF (GAUZE/BANDAGES/DRESSINGS) IMPLANT
BNDG ELASTIC 3X5.8 VLCR NS LF (GAUZE/BANDAGES/DRESSINGS) ×2 IMPLANT
BNDG ESMARK 4X12 TAN STRL LF (GAUZE/BANDAGES/DRESSINGS) ×2 IMPLANT
CHLORAPREP W/TINT 26 (MISCELLANEOUS) ×1 IMPLANT
CORD BIP STRL DISP 12FT (MISCELLANEOUS) ×1 IMPLANT
COVER LIGHT HANDLE FLEXIBLE (MISCELLANEOUS) ×4 IMPLANT
CUFF TOURNIQUET DUAL PORT 18X3 (MISCELLANEOUS) ×2 IMPLANT
DRAPE SURG 17X11 SM STRL (DRAPES) ×2 IMPLANT
GAUZE SPONGE 4X4 12PLY STRL (GAUZE/BANDAGES/DRESSINGS) ×2 IMPLANT
GAUZE XEROFORM 1X8 LF (GAUZE/BANDAGES/DRESSINGS) ×2 IMPLANT
GLOVE BIO SURGEON STRL SZ8 (GLOVE) ×5 IMPLANT
GLOVE INDICATOR 8.0 STRL GRN (GLOVE) ×3 IMPLANT
GOWN STRL REUS W/ TWL LRG LVL3 (GOWN DISPOSABLE) ×1 IMPLANT
GOWN STRL REUS W/ TWL XL LVL3 (GOWN DISPOSABLE) ×1 IMPLANT
GOWN STRL REUS W/TWL LRG LVL3 (GOWN DISPOSABLE) ×1
GOWN STRL REUS W/TWL XL LVL3 (GOWN DISPOSABLE) ×1
KIT TURNOVER KIT A (KITS) ×2 IMPLANT
NS IRRIG 500ML POUR BTL (IV SOLUTION) ×2 IMPLANT
PACK EXTREMITY ARMC (MISCELLANEOUS) ×2 IMPLANT
PADDING CAST 3IN STRL (MISCELLANEOUS) ×2
PADDING CAST BLEND 3X4 STRL (MISCELLANEOUS) IMPLANT
SPLINT CAST 1 STEP 4X30 (MISCELLANEOUS) ×1 IMPLANT
SUT PROLENE 4 0 PS 2 18 (SUTURE) ×5 IMPLANT
SUT VIC AB 3-0 SH 27 (SUTURE)
SUT VIC AB 3-0 SH 27X BRD (SUTURE) IMPLANT

## 2019-09-07 NOTE — H&P (Signed)
Paper H&P to be scanned into permanent record. H&P reviewed and patient re-examined. No changes. 

## 2019-09-07 NOTE — Anesthesia Postprocedure Evaluation (Signed)
Anesthesia Post Note  Patient: Taylor Medina  Procedure(s) Performed: DUPUYTREN CONTRACTURE RELEASE (Left Little Finger)     Patient location during evaluation: PACU Anesthesia Type: Bier Block Level of consciousness: awake and alert Pain management: pain level controlled Vital Signs Assessment: post-procedure vital signs reviewed and stable Respiratory status: spontaneous breathing, nonlabored ventilation, respiratory function stable and patient connected to nasal cannula oxygen Cardiovascular status: blood pressure returned to baseline and stable Postop Assessment: no apparent nausea or vomiting Anesthetic complications: no    Jamaya Sleeth

## 2019-09-07 NOTE — Anesthesia Procedure Notes (Signed)
Performed by: Hazleigh Mccleave, CRNA Pre-anesthesia Checklist: Patient identified, Emergency Drugs available, Suction available, Timeout performed and Patient being monitored Patient Re-evaluated:Patient Re-evaluated prior to induction Oxygen Delivery Method: Simple face mask Placement Confirmation: positive ETCO2       

## 2019-09-07 NOTE — Op Note (Signed)
09/07/2019  1:59 PM  Patient:   Taylor Medina  Pre-Op Diagnosis:   Dupuytren's contracture, left ring and little fingers.  Post-Op Diagnosis:   Same.  Procedure:   Release of Dupuytren's contracture, left ring and little fingers.  Surgeon:   Pascal Lux, MD  Assistant:   None  Anesthesia:   Bier block  Findings:   As above.  Complications:   None  EBL:   0 cc  Fluids:   1000 cc crystalloid  TT:   91 minutes at 250 mmHg  Drains:   None  Closure:   4-0 Prolene interrupted sutures  Brief Clinical Note:   The patient is a 53 year old male with a many year history of gradually worsening contracture of the left little finger. The symptoms have progressed despite medications, activity modification, etc. The patient's history and examination are consistent with a Dupuytren's contracture of the left little finger with extension of the contracture in the palm to the area of the left ring finger. The patient presents at this time for release of the Dupuytren's contractures of the left ring and little fingers.  Procedure:   The patient was brought into the operating room and lain in the supine position. After adequate IV sedation was achieved, a timeout was performed by verify the appropriate surgical site. A Bier block was performed by the anesthesiologist and the tourniquet inflated to 250 mmHg. The left hand and upper extremity were prepped with ChloraPrep solution before being draped sterilely. Preoperative antibiotics were administered. A a second timeout to verify the appropriate surgical site, a Erick Blinks type zigzag incision was made along the volar aspect of the left ring and little fingers beginning just proximal to the proximal palmar crease and extending to the DIP flexion crease of the left little finger. The incision was carried down through subcutaneous tissues. The fibrous cords were identified and carefully dissected out from proximal to distal after releasing it proximally.  As dissection was carried out, care was taken to identify and protect the common digital nerve and arteries on either side of the cords, as well as the underlying flexor tendon, proximally. More distally, the digital neurovascular bundles were identified and protected. After the mass was removed in its entirety, the adequacy of excision was verified by palpation as well as visually. After excision of the Dupuytren's tissue, the left ring and little finger MCP and PIP joints could be extended fully.  The wound was copiously irrigated with sterile saline solution before the skin was reapproximated using 4-0 Prolene interrupted sutures. A total of 10 cc of 0.5% plain Sensorcaine was injected in and around the incision to help with postoperative analgesia before a sterile bulky dressing and volar splint extending to the fingertips was applied, maintaining the MCP joints in extension. The patient was then awakened and returned to the recovery room in satisfactory condition after tolerating the procedure well.

## 2019-09-07 NOTE — Anesthesia Preprocedure Evaluation (Signed)
Anesthesia Evaluation  Patient identified by MRN, date of birth, ID band Patient awake    Reviewed: NPO status   History of Anesthesia Complications Negative for: history of anesthetic complications  Airway Mallampati: II  TM Distance: >3 FB Neck ROM: full    Dental no notable dental hx.    Pulmonary sleep apnea and Continuous Positive Airway Pressure Ventilation ,    Pulmonary exam normal        Cardiovascular Exercise Tolerance: Good hypertension, Normal cardiovascular exam     Neuro/Psych negative neurological ROS  negative psych ROS   GI/Hepatic negative GI ROS, Neg liver ROS,   Endo/Other  negative endocrine ROS  Renal/GU negative Renal ROS  negative genitourinary   Musculoskeletal Gout;  L Shoulder pain   Abdominal   Peds  Hematology negative hematology ROS (+)   Anesthesia Other Findings Hx EtOH abuse;   Covid: NEG.  Reproductive/Obstetrics                             Anesthesia Physical Anesthesia Plan  ASA: II  Anesthesia Plan: General and Bier Block and Bier Block-Lidocaine Only   Post-op Pain Management:    Induction:   PONV Risk Score and Plan: 2 and TIVA and Propofol infusion  Airway Management Planned:   Additional Equipment:   Intra-op Plan:   Post-operative Plan:   Informed Consent: I have reviewed the patients History and Physical, chart, labs and discussed the procedure including the risks, benefits and alternatives for the proposed anesthesia with the patient or authorized representative who has indicated his/her understanding and acceptance.       Plan Discussed with: CRNA  Anesthesia Plan Comments: (Bier block)        Anesthesia Quick Evaluation

## 2019-09-07 NOTE — Transfer of Care (Signed)
Immediate Anesthesia Transfer of Care Note  Patient: Taylor Medina  Procedure(s) Performed: DUPUYTREN CONTRACTURE RELEASE (Left Little Finger)  Patient Location: PACU  Anesthesia Type: General, Bier Block  Level of Consciousness: awake, alert  and patient cooperative  Airway and Oxygen Therapy: Patient Spontanous Breathing and Patient connected to supplemental oxygen  Post-op Assessment: Post-op Vital signs reviewed, Patient's Cardiovascular Status Stable, Respiratory Function Stable, Patent Airway and No signs of Nausea or vomiting  Post-op Vital Signs: Reviewed and stable  Complications: No apparent anesthesia complications

## 2019-09-07 NOTE — Anesthesia Procedure Notes (Signed)
Anesthesia Regional Block: Bier block (IV Regional)   Pre-Anesthetic Checklist: ,, timeout performed, Correct Patient, Correct Site, Correct Laterality, Correct Procedure, Correct Position, site marked, Risks and benefits discussed,  Surgical consent,  Pre-op evaluation,  At surgeon's request and post-op pain management  Laterality: Left      Procedures:,,,,, intact distal pulses, Esmarch exsanguination,, #20gu IV placed and double tourniquet utilized  Narrative:  Start time: 09/07/2019 12:06 PM End time: 09/07/2019 12:07 PM  Performed by: With CRNAs  Anesthesiologist: Fidel Levy, MD CRNA: Mayme Genta, CRNA  Additional Notes:  Double tourniquet to L upper arm. Esmarch exsanguination without difficulty. Proximal and distal cuffs inflated, then distal cuff deflated. 73mL 0.5% lidocaine injected into L arm. 20g angio d/c'd after injection. Tolerated well by pt.

## 2019-09-07 NOTE — Discharge Instructions (Signed)
General Anesthesia, Adult, Care After °This sheet gives you information about how to care for yourself after your procedure. Your health care provider may also give you more specific instructions. If you have problems or questions, contact your health care provider. °What can I expect after the procedure? °After the procedure, the following side effects are common: °· Pain or discomfort at the IV site. °· Nausea. °· Vomiting. °· Sore throat. °· Trouble concentrating. °· Feeling cold or chills. °· Weak or tired. °· Sleepiness and fatigue. °· Soreness and body aches. These side effects can affect parts of the body that were not involved in surgery. °Follow these instructions at home: ° °For at least 24 hours after the procedure: °· Have a responsible adult stay with you. It is important to have someone help care for you until you are awake and alert. °· Rest as needed. °· Do not: °? Participate in activities in which you could fall or become injured. °? Drive. °? Use heavy machinery. °? Drink alcohol. °? Take sleeping pills or medicines that cause drowsiness. °? Make important decisions or sign legal documents. °? Take care of children on your own. °Eating and drinking °· Follow any instructions from your health care provider about eating or drinking restrictions. °· When you feel hungry, start by eating small amounts of foods that are soft and easy to digest (bland), such as toast. Gradually return to your regular diet. °· Drink enough fluid to keep your urine pale yellow. °· If you vomit, rehydrate by drinking water, juice, or clear broth. °General instructions °· If you have sleep apnea, surgery and certain medicines can increase your risk for breathing problems. Follow instructions from your health care provider about wearing your sleep device: °? Anytime you are sleeping, including during daytime naps. °? While taking prescription pain medicines, sleeping medicines, or medicines that make you drowsy. °· Return to  your normal activities as told by your health care provider. Ask your health care provider what activities are safe for you. °· Take over-the-counter and prescription medicines only as told by your health care provider. °· If you smoke, do not smoke without supervision. °· Keep all follow-up visits as told by your health care provider. This is important. °Contact a health care provider if: °· You have nausea or vomiting that does not get better with medicine. °· You cannot eat or drink without vomiting. °· You have pain that does not get better with medicine. °· You are unable to pass urine. °· You develop a skin rash. °· You have a fever. °· You have redness around your IV site that gets worse. °Get help right away if: °· You have difficulty breathing. °· You have chest pain. °· You have blood in your urine or stool, or you vomit blood. °Summary °· After the procedure, it is common to have a sore throat or nausea. It is also common to feel tired. °· Have a responsible adult stay with you for the first 24 hours after general anesthesia. It is important to have someone help care for you until you are awake and alert. °· When you feel hungry, start by eating small amounts of foods that are soft and easy to digest (bland), such as toast. Gradually return to your regular diet. °· Drink enough fluid to keep your urine pale yellow. °· Return to your normal activities as told by your health care provider. Ask your health care provider what activities are safe for you. °This information is not   intended to replace advice given to you by your health care provider. Make sure you discuss any questions you have with your health care provider. Document Released: 01/05/2001 Document Revised: 10/02/2017 Document Reviewed: 05/15/2017 Elsevier Patient Education  2020 [place] discharge instructions: Keep splint dry and intact. Keep hand elevated above heart level. Apply ice to affected area  frequently. Take ibuprofen 600-800 mg TID with meals for 7-10 days, then as necessary. Take pain medication as prescribed or ES Tylenol when needed.  Return for follow-up in 10-14 days or as scheduled.

## 2019-09-21 ENCOUNTER — Encounter: Payer: 59 | Admitting: Physician Assistant

## 2019-09-22 ENCOUNTER — Ambulatory Visit: Payer: 59 | Admitting: Occupational Therapy

## 2019-09-27 ENCOUNTER — Ambulatory Visit: Payer: 59 | Attending: Student | Admitting: Occupational Therapy

## 2019-09-27 ENCOUNTER — Other Ambulatory Visit: Payer: Self-pay

## 2019-09-27 ENCOUNTER — Encounter: Payer: Self-pay | Admitting: Occupational Therapy

## 2019-09-27 DIAGNOSIS — L905 Scar conditions and fibrosis of skin: Secondary | ICD-10-CM | POA: Insufficient documentation

## 2019-09-27 DIAGNOSIS — M25642 Stiffness of left hand, not elsewhere classified: Secondary | ICD-10-CM | POA: Insufficient documentation

## 2019-09-27 DIAGNOSIS — M6281 Muscle weakness (generalized): Secondary | ICD-10-CM | POA: Diagnosis present

## 2019-09-27 DIAGNOSIS — M79642 Pain in left hand: Secondary | ICD-10-CM | POA: Diagnosis present

## 2019-09-27 NOTE — Therapy (Signed)
[facility] [address] [place], [address] Phone: [phone]   Fax:  [phone]  Occupational Therapy Evaluation  Patient Details  Name: Taylor Medina MRN: [id number] Date of Birth: [birth year] Referring Provider (OT): Dr Roland Rack   Encounter Date: 09/27/2019  OT End of Session - 09/27/19 1716    Visit Number  1    Number of Visits  16    Date for OT Re-Evaluation  11/22/19    OT Start Time  0745    OT Stop Time  0832    OT Time Calculation (min)  47 min    Activity Tolerance  Patient tolerated treatment well    Behavior During Therapy  [facility] for tasks assessed/performed       Past Medical History:  Diagnosis Date  . Diverticulosis   . HTN (hypertension)    episodic   . Palpitations   . Sleep apnea    CPAP  . Wears contact lenses     Past Surgical History:  Procedure Laterality Date  . COLONOSCOPY WITH PROPOFOL N/A 12/10/2017   Procedure: COLONOSCOPY WITH PROPOFOL;  Surgeon: Virgel Manifold, MD;  Location: [facility];  Service: Endoscopy;  Laterality: N/A;  . 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 ESOPHAGOGASTRODUODENOSCOPY (EGD) WITH PROPOFOL  12/10/2017   Procedure: ESOPHAGOGASTRODUODENOSCOPY (EGD) WITH PROPOFOL;  Surgeon: Virgel Manifold, MD;  Location: [facility];  Service: Endoscopy;;  . FRACTURE SURGERY  2005   Rod placed.  Tib/fib fracture  . hardware fractions     on R; several surgeries   . hernia repair surgery      There were no vitals filed for this visit.  Subjective Assessment - 09/27/19 0907    Subjective   I seen Dr Roland Rack yesterday - my incision is gapping in my palm - opening - I am washing it , and putting neosporin on with bandage - and kept it in splint since yesterday with acewrap - only sore - but  fingers are stiff    Pertinent History  Pt had L 5th and 4th digit/palm dupuytrens release on 11/25/202- sutures come out on 12/7 - pt seen Dr Roland Rack yesterday because of incision no closing in palm - open - pt is washing , dressing and bandaging hand    Patient Stated Goals  I want to incision to close , and heal - swellling and stiffness in my hand and fingers better so I can use my hand - I need to use it on computer, gripping , holding    Currently in Pain?  Yes    Pain Score  2     Pain Location  Hand    Pain Orientation  Left    Pain Descriptors / Indicators  Sore    Pain Type  Surgical pain    Pain Onset  1 to 4 weeks ago    Pain Frequency  Constant        [facility] OT Assessment - 09/27/19 0001      Assessment   Medical Diagnosis  L dupuytrens release 4th and 5th     Referring Provider (OT)  Dr Roland Rack    Onset Date/Surgical Date  09/07/19    Hand Dominance  Right    Next MD Visit  --   10/03/2019     Precautions  Precaution Comments  --   Do no force digits open or extention      Home  Environment   Lives With  Spouse      Prior Function   Vocation  Full time employment    Leisure  Audiological scientist,  yard work , doing things around the house       Left Hand AROM   L Thumb Opposition to Index  --   Opposition to side of 5th    L Index  MCP 0-90  50 Degrees    L Index PIP 0-100  90 Degrees    L Index DIP 0-70  60 Degrees    L Long  MCP 0-90  60 Degrees    L Long PIP 0-100  60 Degrees   -24   L Long DIP 0-70  66 Degrees    L Ring  MCP 0-90  60 Degrees    L Ring PIP 0-100  60 Degrees   0   L Ring DIP 0-70  60 Degrees    L Little  MCP 0-90  35 Degrees    L Little PIP 0-100  60 Degrees   -15   L Little DIP 0-70  20 Degrees          contrast done prior to ROM HEP  Do not force extention of digits -   Contrast  Tendon glides - block intrinsic part ( 5th separate and then all 4)  Composite fist part to 3 cm foam block -and then gradually if no pain to 2 cm foam  block  Opposition to all digits - 5-10 reps   NO PROM extention  Gentle AROM extention  Keep dressing on - can leave off sometimes if just sitting to get air tubigrip provided for compression on hand And silicon sleeve on 5th - digit - 2 hrs on and off             OT Education - 09/27/19 0916    Education Details  findings of eval and HEP    Person(s) Educated  Patient    Methods  Explanation;Demonstration;Tactile cues;Verbal cues;Handout    Comprehension  Verbal cues required;Returned demonstration;Verbalized understanding       OT Short Term Goals - 09/27/19 1723      OT SHORT TERM GOAL #1   Title  Pt to be independent in HEP to decrease edema , increase AROM while incision is healing    Baseline  no knowledge    Time  2    Period  Weeks    Status  New    Target Date  10/11/19      OT SHORT TERM GOAL #2   Title  L digits ARO M improve for pt to touch palm with pain less than 2/10    Baseline  see flowsheet- pain increase to 2-5/10    Time  4    Period  Weeks    Status  New    Target Date  10/25/19        OT Long Term Goals - 09/27/19 1726      OT LONG TERM GOAL #1   Title  L hand incision and scar improve for pt to tolerate different textures and gripping tools to use at home    Baseline  incision still open and moist    Time  6    Period  Weeks    Status  New    Target Date  11/08/19  OT LONG TERM GOAL #2   Title  L grip and prehension strength increase to more than 50%  compare to R hand to carry 10 lbs ,grip steering wheel    Baseline  NT- incision open still    Time  8    Period  Weeks    Status  New    Target Date  11/22/19      OT LONG TERM GOAL #3   Title  Function score on PRWHE improve with more than 30 points    Baseline  at eval PRWHE score on function 44/50    Time  8    Period  Weeks    Status  New    Target Date  11/22/19            Plan - 09/27/19 1717    Clinical Impression Statement  Pt is tomorrow 3 wks s/p L  dupuytrens release - pt arrive this date with surgical incisions to be healing well, other than the portion of the incision over the palmar aspect of the 4th metacarpal which appears to be a little moist. In addition, there may be slight gapping of an approximate 1 cm portion of the  T incision at this location - pt has bandage on and did clean some of her dry scab on 5th digit - pt show decrease AROM and strenght - increase edema , scar tissue and pain with ROM - limiting his functional use in ADL's and IADL's    OT Occupational Profile and History  Problem Focused Assessment - Including review of records relating to presenting problem    Occupational performance deficits (Please refer to evaluation for details):  ADL's;IADL's;Work;Play;Leisure;Social Participation    Body Structure / Function / Physical Skills  ADL;ROM;UE functional use;Scar mobility;Pain;IADL;Coordination;Dexterity;Skin integrity;Flexibility    Rehab Potential  Good    Clinical Decision Making  Limited treatment options, no task modification necessary    Comorbidities Affecting Occupational Performance:  None    Modification or Assistance to Complete Evaluation   No modification of tasks or assist necessary to complete eval    OT Frequency  2x / week    OT Duration  8 weeks    OT Treatment/Interventions  Self-care/ADL training;Therapeutic exercise;Patient/family education;Splinting;Paraffin;Fluidtherapy;Contrast Bath;Manual Therapy;Passive range of motion;Scar mobilization    Plan  Assess incision and progress with AROM    OT Home Exercise Plan  see pt instruction    Consulted and Agree with Plan of Care  Patient       Patient will benefit from skilled therapeutic intervention in order to improve the following deficits and impairments:   Body Structure / Function / Physical Skills: ADL, ROM, UE functional use, Scar mobility, Pain, IADL, Coordination, Dexterity, Skin integrity, Flexibility       Visit Diagnosis: Scar  condition and fibrosis of skin - Plan: Ot plan of care cert/re-cert  Stiffness of left hand, not elsewhere classified - Plan: Ot plan of care cert/re-cert  Pain in left hand - Plan: Ot plan of care cert/re-cert  Muscle weakness (generalized) - Plan: Ot plan of care cert/re-cert    Problem List Patient Active Problem List   Diagnosis Date Noted  . Esophageal dysphagia   . Thickening of esophagus   . Reflux esophagitis   . Stomach irritation   . FH: colon cancer   . Special screening for malignant neoplasms, colon   . Benign neoplasm of ascending colon   . External hemorrhoids   . Benign neoplasm of descending colon   .  OSA (obstructive sleep apnea) 10/24/2016  . Hypertriglyceridemia 12/24/2009  . HYPERTENSION, BENIGN 12/24/2009    Rosalyn Gess OTR/L,CLT 09/27/2019, 5:31 PM  [facility] [address], [place], [address] Phone: [phone]   Fax:  [phone]  Name: Taylor Medina MRN: [id number] Date of Birth: 10-18-1965

## 2019-09-27 NOTE — Patient Instructions (Signed)
Contrast  Tendon glides - block intrinsic part ( 5th separate and then all 4)  Composite fist part to 3 cm foam block -and then gradually if no pain to 2 cm foam block  Opposition to all digits - 5-10 reps   NO PROM extention  Gentle AROM extention  Keep dressing on - can leave off sometimes if just sitting to get air tubigrip provided for compression on hand And silicon sleeve on 5th - digit - 2 hrs on and off

## 2019-09-30 ENCOUNTER — Ambulatory Visit: Payer: 59 | Admitting: Occupational Therapy

## 2019-09-30 ENCOUNTER — Other Ambulatory Visit: Payer: Self-pay

## 2019-09-30 DIAGNOSIS — L905 Scar conditions and fibrosis of skin: Secondary | ICD-10-CM | POA: Diagnosis not present

## 2019-09-30 DIAGNOSIS — M6281 Muscle weakness (generalized): Secondary | ICD-10-CM

## 2019-09-30 DIAGNOSIS — M25642 Stiffness of left hand, not elsewhere classified: Secondary | ICD-10-CM

## 2019-09-30 DIAGNOSIS — M79642 Pain in left hand: Secondary | ICD-10-CM

## 2019-09-30 NOTE — Patient Instructions (Signed)
Same HEP but do not do full extention  And replace sterri strips as needed

## 2019-09-30 NOTE — Therapy (Signed)
[facility] [address] [place], [address] Phone: [phone]   Fax:  [phone]  Occupational Therapy Treatment  Patient Details  Name: Taylor Medina MRN: [id number] Date of Birth: [birth year] Referring Provider (OT): Dr Roland Rack   Encounter Date: 09/30/2019  OT End of Session - 09/30/19 1135    Visit Number  2    Number of Visits  16    Date for OT Re-Evaluation  11/22/19    OT Start Time  0830    OT Stop Time  0900    OT Time Calculation (min)  30 min    Activity Tolerance  Patient tolerated treatment well    Behavior During Therapy  [facility] for tasks assessed/performed       Past Medical History:  Diagnosis Date  . Diverticulosis   . HTN (hypertension)    episodic   . Palpitations   . Sleep apnea    CPAP  . Wears contact lenses     Past Surgical History:  Procedure Laterality Date  . COLONOSCOPY WITH PROPOFOL N/A 12/10/2017   Procedure: COLONOSCOPY WITH PROPOFOL;  Surgeon: Virgel Manifold, MD;  Location: [facility];  Service: Endoscopy;  Laterality: N/A;  . 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 ESOPHAGOGASTRODUODENOSCOPY (EGD) WITH PROPOFOL  12/10/2017   Procedure: ESOPHAGOGASTRODUODENOSCOPY (EGD) WITH PROPOFOL;  Surgeon: Virgel Manifold, MD;  Location: [facility];  Service: Endoscopy;;  . FRACTURE SURGERY  2005   Rod placed.  Tib/fib fracture  . hardware fractions     on R; several surgeries   . hernia repair surgery      There were no vitals filed for this visit.  Subjective Assessment - 09/30/19 1130    Subjective   I have appt with Dr Roland Rack on Monday - this wound do not look good - no redness or fewer - but it still is open and deep - right her in the crease- I showered this am but only put on the neosporin on that  one time    Pertinent History  Pt had L 5th and 4th digit/palm dupuytrens release on 11/25/202- sutures come out on 12/7 - pt seen Dr Roland Rack yesterday because of incision no closing in palm - open - pt is washing , dressing and bandaging hand    Patient Stated Goals  I want to incision to close , and heal - swellling and stiffness in my hand and fingers better so I can use my hand - I need to use it on computer, gripping , holding    Currently in Pain?  Yes    Pain Score  4     Pain Location  Hand    Pain Orientation  Left    Pain Descriptors / Indicators  Aching;Tender;Sore    Pain Type  Surgical pain    Pain Onset  1 to 4 weeks ago    Pain Frequency  Constant       Incision still open on distal part in [facility] - applied this date 2 long 4 cm sterri strips Provided xtra to apply over weekend if come off Distal part removed some escar - but still thick at volar MC of 5th and ulnar side of palm  Did not had redness or fever - pt report foul smell - but OT did not detect  that in session Pt to cont to avoid extention - stop when feeling pull               OT Treatments/Exercises (OP) - 09/30/19 0001      LUE Contrast Bath   Time  9 minutes    Comments  prior to AROM review - and after scar assessment to decrease pain and stiffness        after contrast - had less stiffness -and was able to do intrinsic fist with more ease than last time and composite fist to palm  Pt to not push MC flexion - lumbrical and end range extention while healing   pt report he has appt Monday with surgeon   Reinforce again with pt to keep it covered  And can use neosporin some  And follow Dr Roland Rack instruction about cleaning        OT Education - 09/30/19 1134    Education Details  HEP review again    Person(s) Educated  Patient    Methods  Explanation;Demonstration;Tactile cues;Verbal cues;Handout    Comprehension  Verbal cues required;Returned demonstration;Verbalized understanding        OT Short Term Goals - 09/27/19 1723      OT SHORT TERM GOAL #1   Title  Pt to be independent in HEP to decrease edema , increase AROM while incision is healing    Baseline  no knowledge    Time  2    Period  Weeks    Status  New    Target Date  10/11/19      OT SHORT TERM GOAL #2   Title  L digits ARO M improve for pt to touch palm with pain less than 2/10    Baseline  see flowsheet- pain increase to 2-5/10    Time  4    Period  Weeks    Status  New    Target Date  10/25/19        OT Long Term Goals - 09/27/19 1726      OT LONG TERM GOAL #1   Title  L hand incision and scar improve for pt to tolerate different textures and gripping tools to use at home    Baseline  incision still open and moist    Time  6    Period  Weeks    Status  New    Target Date  11/08/19      OT LONG TERM GOAL #2   Title  L grip and prehension strength increase to more than 50%  compare to R hand to carry 10 lbs ,grip steering wheel    Baseline  NT- incision open still    Time  8    Period  Weeks    Status  New    Target Date  11/22/19      OT LONG TERM GOAL #3   Title  Function score on PRWHE improve with more than 30 points    Baseline  at eval PRWHE score on function 44/50    Time  8    Period  Weeks    Status  New    Target Date  11/22/19            Plan - 09/30/19 1135    Clinical Impression Statement  Pt is 3 wks and 2 days s/p L dupuytrens release - pt arrive this date with surgical incision still open proximal in [facility] , apply this date 2 x 4  cm long sterristrips and to not do full extention of digits - escar slowly coming off on distal scar - still area that is deep on ulnar palm ,and volar MC joint - AROM for flexion improve -and stiffness decrease with contrast - pt did not had any fewer, redness - pt mention foul smell but OT did not in session - pt to see surgeon Monday    OT Occupational Profile and History  Problem Focused Assessment - Including review of records  relating to presenting problem    Occupational performance deficits (Please refer to evaluation for details):  ADL's;IADL's;Work;Play;Leisure;Social Participation    Body Structure / Function / Physical Skills  ADL;ROM;UE functional use;Scar mobility;Pain;IADL;Coordination;Dexterity;Skin integrity;Flexibility    Rehab Potential  Good    Clinical Decision Making  Limited treatment options, no task modification necessary    Comorbidities Affecting Occupational Performance:  None    Modification or Assistance to Complete Evaluation   No modification of tasks or assist necessary to complete eval    OT Frequency  2x / week    OT Duration  8 weeks    OT Treatment/Interventions  Self-care/ADL training;Therapeutic exercise;Patient/family education;Splinting;Paraffin;Fluidtherapy;Contrast Bath;Manual Therapy;Passive range of motion;Scar mobilization    Plan  Assess incision and progress with AROM    OT Home Exercise Plan  see pt instruction    Consulted and Agree with Plan of Care  Patient       Patient will benefit from skilled therapeutic intervention in order to improve the following deficits and impairments:   Body Structure / Function / Physical Skills: ADL, ROM, UE functional use, Scar mobility, Pain, IADL, Coordination, Dexterity, Skin integrity, Flexibility       Visit Diagnosis: Scar condition and fibrosis of skin  Stiffness of left hand, not elsewhere classified  Pain in left hand  Muscle weakness (generalized)    Problem List Patient Active Problem List   Diagnosis Date Noted  . Esophageal dysphagia   . Thickening of esophagus   . Reflux esophagitis   . Stomach irritation   . FH: colon cancer   . Special screening for malignant neoplasms, colon   . Benign neoplasm of ascending colon   . External hemorrhoids   . Benign neoplasm of descending colon   . OSA (obstructive sleep apnea) 10/24/2016  . Hypertriglyceridemia 12/24/2009  . HYPERTENSION, BENIGN 12/24/2009     Rosalyn Gess OTR/L,CLT 09/30/2019, 11:41 AM  [facility] [address], [place], [address] Phone: [phone]   Fax:  [phone]  Name: Taylor Medina MRN: [id number] Date of Birth: 1966-07-02

## 2019-10-04 ENCOUNTER — Ambulatory Visit: Payer: 59 | Admitting: Occupational Therapy

## 2019-10-04 ENCOUNTER — Other Ambulatory Visit: Payer: Self-pay

## 2019-10-04 DIAGNOSIS — M79642 Pain in left hand: Secondary | ICD-10-CM

## 2019-10-04 DIAGNOSIS — L905 Scar conditions and fibrosis of skin: Secondary | ICD-10-CM

## 2019-10-04 DIAGNOSIS — M6281 Muscle weakness (generalized): Secondary | ICD-10-CM

## 2019-10-04 DIAGNOSIS — M25642 Stiffness of left hand, not elsewhere classified: Secondary | ICD-10-CM

## 2019-10-04 NOTE — Patient Instructions (Signed)
Same HEP - can use LMB splint for PIP extention on 3rd  And PROM 5th PIP extention 3 x 30 sec   Tendon glides  And scar massage - as scabs come off -can use silicon digi sleeve on 5th and cica scar pad on ulnar side of hand scar

## 2019-10-04 NOTE — Therapy (Signed)
[facility] [address] [place], [address] Phone: [phone]   Fax:  [phone]  Occupational Therapy Treatment  Patient Details  Name: Taylor Medina MRN: [id number] Date of Birth: 1966/09/24 Referring Provider (OT): Dr Roland Rack   Encounter Date: 10/04/2019  OT End of Session - 10/04/19 1253    Visit Number  3    Number of Visits  16    Date for OT Re-Evaluation  11/22/19    OT Start Time  0738    OT Stop Time  0815    OT Time Calculation (min)  37 min    Activity Tolerance  Patient tolerated treatment well    Behavior During Therapy  [facility] for tasks assessed/performed       Past Medical History:  Diagnosis Date  . Diverticulosis   . HTN (hypertension)    episodic   . Palpitations   . Sleep apnea    CPAP  . Wears contact lenses     Past Surgical History:  Procedure Laterality Date  . COLONOSCOPY WITH PROPOFOL N/A 12/10/2017   Procedure: COLONOSCOPY WITH PROPOFOL;  Surgeon: Virgel Manifold, MD;  Location: [facility]OS[facility];  Service: Endoscopy;  Laterality: N/A;  . 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 ESOPHAGOGASTRODUODENOSCOPY (EGD) WITH PROPOFOL  12/10/2017   Procedure: ESOPHAGOGASTRODUODENOSCOPY (EGD) WITH PROPOFOL;  Surgeon: Virgel Manifold, MD;  Location: [place] [facility];  Service: Endoscopy;;  . FRACTURE SURGERY  2005   Rod placed.  Tib/fib fracture  . hardware fractions     on R; several surgeries   . hernia repair surgery      There were no vitals filed for this visit.  Subjective Assessment - 10/04/19 1250    Subjective   Doing okay - seen Dr Roland Rack yesterday- seeing him again in 2 wks- that flap come off and healing from inside now -and some of my other scabs come off - my motion is much better than what it was     Pertinent History  Pt had L 5th and 4th digit/palm dupuytrens release on 11/25/202- sutures come out on 12/7 - pt seen Dr Roland Rack yesterday because of incision no closing in palm - open - pt is washing , dressing and bandaging hand    Patient Stated Goals  I want to incision to close , and heal - swellling and stiffness in my hand and fingers better so I can use my hand - I need to use it on computer, gripping , holding    Currently in Pain?  No/denies         [facility] OT Assessment - 10/04/19 0001      Left Hand AROM   L Index  MCP 0-90  80 Degrees    L Index PIP 0-100  100 Degrees    L Long  MCP 0-90  82 Degrees    L Long PIP 0-100  100 Degrees   -28   L Ring  MCP 0-90  80 Degrees    L Ring PIP 0-100  95 Degrees   0   L Little PIP 0-100  75 Degrees    L Little DIP 0-70  90 Degrees   -30     great progress in AROM in digits - pt did loose some extention on 5th PIP - because of delay healing  of scars - but improving this date  Pt ed on scar massage - where scabs is off  And can start using if appropriate next week cica scar pad and silicon sleeve on 5th   LMB splint use on 3rd PIP and can use at home  And PROM for PIP extention 3 x 30 sec on 5th and PIP extention with lumbrical fist too          OT Treatments/Exercises (OP) - 10/04/19 0001      LUE Contrast Bath   Time  9 minutes    Comments  prior to AROM for digits tendon glides       tendon glides review -progressing well - pt to cont with same HEP  And incision cleaning  - following surgeon protocol        OT Education - 10/04/19 1252    Education Details  upgrade and change to HEP    Person(s) Educated  Patient    Methods  Explanation;Demonstration;Tactile cues;Verbal cues;Handout    Comprehension  Verbal cues required;Returned demonstration;Verbalized understanding       OT Short Term Goals - 09/27/19 1723      OT SHORT TERM GOAL #1   Title  Pt to be independent in HEP to decrease edema , increase AROM  while incision is healing    Baseline  no knowledge    Time  2    Period  Weeks    Status  New    Target Date  10/11/19      OT SHORT TERM GOAL #2   Title  L digits ARO M improve for pt to touch palm with pain less than 2/10    Baseline  see flowsheet- pain increase to 2-5/10    Time  4    Period  Weeks    Status  New    Target Date  10/25/19        OT Long Term Goals - 09/27/19 1726      OT LONG TERM GOAL #1   Title  L hand incision and scar improve for pt to tolerate different textures and gripping tools to use at home    Baseline  incision still open and moist    Time  6    Period  Weeks    Status  New    Target Date  11/08/19      OT LONG TERM GOAL #2   Title  L grip and prehension strength increase to more than 50%  compare to R hand to carry 10 lbs ,grip steering wheel    Baseline  NT- incision open still    Time  8    Period  Weeks    Status  New    Target Date  11/22/19      OT LONG TERM GOAL #3   Title  Function score on PRWHE improve with more than 30 points    Baseline  at eval PRWHE score on function 44/50    Time  8    Period  Weeks    Status  New    Target Date  11/22/19            Plan - 10/04/19 1253    Clinical Impression Statement  Pt is about 4 wks s/p L dupuytrens release - pt show great progress in AROM of digits - incision healing - flap on palmar incision come off -and healing from outside and inside - still one area of 1cm by  1 cm scab , and some on ulnar side of hand - progressing very well on volar 5th - pt to slowly remove scabs as he can -and can then do if appropriate cica scar pad and silicon sleeve on 5th    OT Occupational Profile and History  Problem Focused Assessment - Including review of records relating to presenting problem    Occupational performance deficits (Please refer to evaluation for details):  ADL's;IADL's;Work;Play;Leisure;Social Participation    Body Structure / Function / Physical Skills  ADL;ROM;UE functional  use;Scar mobility;Pain;IADL;Coordination;Dexterity;Skin integrity;Flexibility    Rehab Potential  Good    Clinical Decision Making  Limited treatment options, no task modification necessary    Comorbidities Affecting Occupational Performance:  None    Modification or Assistance to Complete Evaluation   No modification of tasks or assist necessary to complete eval    OT Frequency  2x / week    OT Duration  6 weeks    OT Treatment/Interventions  Self-care/ADL training;Therapeutic exercise;Patient/family education;Splinting;Paraffin;Fluidtherapy;Contrast Bath;Manual Therapy;Passive range of motion;Scar mobilization    Plan  Assess incision/progress and  with AROM    OT Home Exercise Plan  see pt instruction    Consulted and Agree with Plan of Care  Patient       Patient will benefit from skilled therapeutic intervention in order to improve the following deficits and impairments:   Body Structure / Function / Physical Skills: ADL, ROM, UE functional use, Scar mobility, Pain, IADL, Coordination, Dexterity, Skin integrity, Flexibility       Visit Diagnosis: Stiffness of left hand, not elsewhere classified  Pain in left hand  Muscle weakness (generalized)  Scar condition and fibrosis of skin    Problem List Patient Active Problem List   Diagnosis Date Noted  . Esophageal dysphagia   . Thickening of esophagus   . Reflux esophagitis   . Stomach irritation   . FH: colon cancer   . Special screening for malignant neoplasms, colon   . Benign neoplasm of ascending colon   . External hemorrhoids   . Benign neoplasm of descending colon   . OSA (obstructive sleep apnea) 10/24/2016  . Hypertriglyceridemia 12/24/2009  . HYPERTENSION, BENIGN 12/24/2009    Rosalyn Gess OTRL,CLT 10/04/2019, 12:57 PM  [facility] [address] [place], [address] Phone: [phone]   Fax:  [phone]  Name: Taylor Medina MRN: [id number] Date of Birth: June 18, 1966

## 2019-10-13 ENCOUNTER — Other Ambulatory Visit: Payer: Self-pay

## 2019-10-13 ENCOUNTER — Ambulatory Visit: Payer: 59 | Admitting: Occupational Therapy

## 2019-10-13 DIAGNOSIS — M25642 Stiffness of left hand, not elsewhere classified: Secondary | ICD-10-CM

## 2019-10-13 DIAGNOSIS — M79642 Pain in left hand: Secondary | ICD-10-CM

## 2019-10-13 DIAGNOSIS — L905 Scar conditions and fibrosis of skin: Secondary | ICD-10-CM

## 2019-10-13 DIAGNOSIS — M6281 Muscle weakness (generalized): Secondary | ICD-10-CM

## 2019-10-13 NOTE — Therapy (Signed)
[facility] [address], [place], [address] Phone: [phone]   Fax:  [phone]  Occupational Therapy Treatment  Patient Details  Name: Taylor Medina MRN: [id number] Date of Birth: 1966-09-09 Referring Provider (OT): Dr Roland Rack   Encounter Date: 10/13/2019  OT End of Session - 10/13/19 0802    Visit Number  4    Number of Visits  16    Date for OT Re-Evaluation  11/22/19    OT Start Time  0745    OT Stop Time  0820    OT Time Calculation (min)  35 min    Activity Tolerance  Patient tolerated treatment well    Behavior During Therapy  [facility] for tasks assessed/performed       Past Medical History:  Diagnosis Date  . Diverticulosis   . HTN (hypertension)    episodic   . Palpitations   . Sleep apnea    CPAP  . Wears contact lenses     Past Surgical History:  Procedure Laterality Date  . COLONOSCOPY WITH PROPOFOL N/A 12/10/2017   Procedure: COLONOSCOPY WITH PROPOFOL;  Surgeon: Virgel Manifold, MD;  Location: [facility];  Service: Endoscopy;  Laterality: N/A;  . 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 ESOPHAGOGASTRODUODENOSCOPY (EGD) WITH PROPOFOL  12/10/2017   Procedure: ESOPHAGOGASTRODUODENOSCOPY (EGD) WITH PROPOFOL;  Surgeon: Virgel Manifold, MD;  Location: [place] [facility];  Service: Endoscopy;;  . FRACTURE SURGERY  2005   Rod placed.  Tib/fib fracture  . hardware fractions     on R; several surgeries   . hernia repair surgery      There were no vitals filed for this visit.  Subjective Assessment - 10/13/19 0757    Subjective   It is getting better- my wound is smaller and the one here at the bottom of pinkie still tender - my swelling better and motion - only some soreness    Pertinent History  Pt had L 5th and 4th digit/palm  dupuytrens release on 11/25/202- sutures come out on 12/7 - pt seen Dr Roland Rack yesterday because of incision no closing in palm - open - pt is washing , dressing and bandaging hand    Patient Stated Goals  I want to incision to close , and heal - swellling and stiffness in my hand and fingers better so I can use my hand - I need to use it on computer, gripping , holding    Currently in Pain?  Yes    Pain Score  1     Pain Location  Hand    Pain Orientation  Left    Pain Descriptors / Indicators  Sore    Pain Type  Surgical pain    Pain Onset  More than a month ago         [facility] OT Assessment - 10/13/19 0001      Left Hand AROM   L Index  MCP 0-90  90 Degrees    L Index PIP 0-100  100 Degrees    L Long  MCP 0-90  90 Degrees    L Long PIP 0-100  95 Degrees   -28   L Ring  MCP 0-90  85 Degrees    L Ring PIP 0-100  95 Degrees   0   L Little PIP 0-100  80 Degrees   -25   L Little DIP 0-70  90 Degrees      Pt show increase MC flexion , and increase extention of PIP of 5th  Incision filling in and healing better- thick scab come off proximal to 5th MC - tender and filling in  Pt has cica scar pad to use if incision heal in the next week - and cont silicon sleeve on 5th - but take off if scabs get to soft   Fitted with new med LMB splint for 5th PIP extention - but only 3-5 min - but it should not cause issue with scar  Tendon glides - AAROm for MC flexion  And    PROM for PIP extention 3 x 30 sec on 5th and PIP extention with lumbrical fist too  Intrinsic fist  Composite fist  Pain free  cont with contrast at home           OT Treatments/Exercises (OP) - 10/13/19 0001      LUE Contrast Bath   Time  9 minutes    Comments  prior to AROM for digits decrease stiffnes and pain             OT Education - 10/13/19 0801    Education Details  HEP review and wound healing    Person(s) Educated  Patient    Methods  Explanation;Demonstration;Tactile cues;Verbal  cues;Handout    Comprehension  Verbal cues required;Returned demonstration;Verbalized understanding       OT Short Term Goals - 09/27/19 1723      OT SHORT TERM GOAL #1   Title  Pt to be independent in HEP to decrease edema , increase AROM while incision is healing    Baseline  no knowledge    Time  2    Period  Weeks    Status  New    Target Date  10/11/19      OT SHORT TERM GOAL #2   Title  L digits ARO M improve for pt to touch palm with pain less than 2/10    Baseline  see flowsheet- pain increase to 2-5/10    Time  4    Period  Weeks    Status  New    Target Date  10/25/19        OT Long Term Goals - 09/27/19 1726      OT LONG TERM GOAL #1   Title  L hand incision and scar improve for pt to tolerate different textures and gripping tools to use at home    Baseline  incision still open and moist    Time  6    Period  Weeks    Status  New    Target Date  11/08/19      OT LONG TERM GOAL #2   Title  L grip and prehension strength increase to more than 50%  compare to R hand to carry 10 lbs ,grip steering wheel    Baseline  NT- incision open still    Time  8    Period  Weeks    Status  New    Target Date  11/22/19      OT LONG TERM GOAL #3   Title  Function score on PRWHE improve with more than 30 points    Baseline  at eval PRWHE score on function 44/50    Time  8    Period  Weeks    Status  New  Target Date  11/22/19            Plan - 10/13/19 0802    Clinical Impression Statement  Pt is about 5 wks s/p L dupuytrens release - pt show increase MC flexion , and 5th PIP extention - incision healing in palm - filling in from the outside- and thick scab come off at ulnar palm proximal to 5th MC - tender - pt to cont with AROM , healing of incision and scar massage where he can    OT Occupational Profile and History  Problem Focused Assessment - Including review of records relating to presenting problem    Occupational performance deficits (Please refer to  evaluation for details):  ADL's;IADL's;Work;Play;Leisure;Social Participation    Body Structure / Function / Physical Skills  ADL;ROM;UE functional use;Scar mobility;Pain;IADL;Coordination;Dexterity;Skin integrity;Flexibility    Rehab Potential  Good    Clinical Decision Making  Limited treatment options, no task modification necessary    Comorbidities Affecting Occupational Performance:  None    Modification or Assistance to Complete Evaluation   No modification of tasks or assist necessary to complete eval    OT Frequency  2x / week    OT Duration  6 weeks    OT Treatment/Interventions  Self-care/ADL training;Therapeutic exercise;Patient/family education;Splinting;Paraffin;Fluidtherapy;Contrast Bath;Manual Therapy;Passive range of motion;Scar mobilization    Plan  Assess incision/progress and  with AROM    OT Home Exercise Plan  see pt instruction    Consulted and Agree with Plan of Care  Patient       Patient will benefit from skilled therapeutic intervention in order to improve the following deficits and impairments:   Body Structure / Function / Physical Skills: ADL, ROM, UE functional use, Scar mobility, Pain, IADL, Coordination, Dexterity, Skin integrity, Flexibility       Visit Diagnosis: Stiffness of left hand, not elsewhere classified  Pain in left hand  Muscle weakness (generalized)  Scar condition and fibrosis of skin    Problem List Patient Active Problem List   Diagnosis Date Noted  . Esophageal dysphagia   . Thickening of esophagus   . Reflux esophagitis   . Stomach irritation   . FH: colon cancer   . Special screening for malignant neoplasms, colon   . Benign neoplasm of ascending colon   . External hemorrhoids   . Benign neoplasm of descending colon   . OSA (obstructive sleep apnea) 10/24/2016  . Hypertriglyceridemia 12/24/2009  . HYPERTENSION, BENIGN 12/24/2009    Rosalyn Gess OTR/l,CLT 10/13/2019, 9:04 AM  [place] [address] [place], [address] Phone: [phone]   Fax:  [phone]  Name: Taylor Medina MRN: [id number] Date of Birth: 1966/06/13

## 2019-10-13 NOTE — Patient Instructions (Signed)
Same HEP -fitted with med LMB splint for 5th PIP extention - wear to tolerance 3-5 min at time

## 2019-10-20 ENCOUNTER — Other Ambulatory Visit: Payer: Self-pay

## 2019-10-20 ENCOUNTER — Ambulatory Visit: Payer: BC Managed Care – PPO | Attending: Student | Admitting: Occupational Therapy

## 2019-10-20 DIAGNOSIS — M79642 Pain in left hand: Secondary | ICD-10-CM

## 2019-10-20 DIAGNOSIS — L905 Scar conditions and fibrosis of skin: Secondary | ICD-10-CM | POA: Diagnosis present

## 2019-10-20 DIAGNOSIS — M25642 Stiffness of left hand, not elsewhere classified: Secondary | ICD-10-CM

## 2019-10-20 DIAGNOSIS — M6281 Muscle weakness (generalized): Secondary | ICD-10-CM | POA: Diagnosis present

## 2019-10-20 NOTE — Therapy (Signed)
[facility] [address] [place], [address] Phone: [phone]   Fax:  [phone]  Occupational Therapy Treatment  Patient Details  Name: Taylor Medina MRN: [id number] Date of Birth: Nov 25, 1965 Referring Provider (OT): Dr Roland Rack   Encounter Date: 10/20/2019  OT End of Session - 10/20/19 0747    Visit Number  5    Number of Visits  16    Date for OT Re-Evaluation  11/22/19    OT Start Time  0734    OT Stop Time  0804    OT Time Calculation (min)  30 min    Activity Tolerance  Patient tolerated treatment well    Behavior During Therapy  [facility] for tasks assessed/performed       Past Medical History:  Diagnosis Date  . Diverticulosis   . HTN (hypertension)    episodic   . Palpitations   . Sleep apnea    CPAP  . Wears contact lenses     Past Surgical History:  Procedure Laterality Date  . COLONOSCOPY WITH PROPOFOL N/A 12/10/2017   Procedure: COLONOSCOPY WITH PROPOFOL;  Surgeon: Virgel Manifold, MD;  Location: [facility];  Service: Endoscopy;  Laterality: N/A;  . 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 ESOPHAGOGASTRODUODENOSCOPY (EGD) WITH PROPOFOL  12/10/2017   Procedure: ESOPHAGOGASTRODUODENOSCOPY (EGD) WITH PROPOFOL;  Surgeon: Virgel Manifold, MD;  Location: [place] [facility];  Service: Endoscopy;;  . FRACTURE SURGERY  2005   Rod placed.  Tib/fib fracture  . hardware fractions     on R; several surgeries   . hernia repair surgery      There were no vitals filed for this visit.  Subjective Assessment - 10/20/19 0744    Subjective   Using my hand in most everything - only some tenderness but not taking anything  for pain - not using it in heavy activities - scars still tender    Pertinent History  Pt had L 5th and 4th digit/palm  dupuytrens release on 11/25/202- sutures come out on 12/7 - pt seen Dr Roland Rack yesterday because of incision no closing in palm - open - pt is washing , dressing and bandaging hand    Patient Stated Goals  I want to incision to close , and heal - swellling and stiffness in my hand and fingers better so I can use my hand - I need to use it on computer, gripping , holding    Currently in Pain?  Yes    Pain Score  1     Pain Location  Hand    Pain Orientation  Left    Pain Descriptors / Indicators  Tender    Pain Type  Surgical pain    Pain Onset  More than a month ago    Pain Frequency  Intermittent         [facility] OT Assessment - 10/20/19 0001      Left Hand AROM   L Long PIP 0-100  --   -30   L Ring  MCP 0-90  85 Degrees    L Ring PIP 0-100  100 Degrees    L Little PIP 0-100  85 Degrees    L Little DIP 0-70  90 Degrees   -15 lateral  OT Treatments/Exercises (OP) - 10/20/19 0001      LUE Contrast Bath   Time  9 minutes    Comments  prior to soft tissue and ROM        Pt show increase MC flexion and PIP -see flowsheet , and increase extention of PIP of 5th  - lateral plane -15 Scab on palmar 1 by 1 cm  Pt has cica scar pad to use if incision heal in the next 2 week - and cont silicon sleeve on 5th - but take off if scabs get to soft   Cont with med LMB splint for 5th PIP extention - but only 3-5 min - but it should not cause issue with scar Large on 3rd PIP   Tendon glides - AAROm for MC flexion     PROM for PIP extention 3 x 30 sec on 5th and PIP extention with lumbrical fist too Intrinsic fist  Composite fist  - good composite this date  Pain free  cont with contrast at home   Grip R 115 , L 65  Pain  Lat grip R 30 , L 26 And 3 point grip R 30 , L 28 pain        OT Education - 10/20/19 0746    Education Details  progress and HEP    Person(s) Educated  Patient    Methods  Explanation;Demonstration;Tactile cues;Verbal cues;Handout     Comprehension  Verbal cues required;Returned demonstration;Verbalized understanding       OT Short Term Goals - 09/27/19 1723      OT SHORT TERM GOAL #1   Title  Pt to be independent in HEP to decrease edema , increase AROM while incision is healing    Baseline  no knowledge    Time  2    Period  Weeks    Status  New    Target Date  10/11/19      OT SHORT TERM GOAL #2   Title  L digits ARO M improve for pt to touch palm with pain less than 2/10    Baseline  see flowsheet- pain increase to 2-5/10    Time  4    Period  Weeks    Status  New    Target Date  10/25/19        OT Long Term Goals - 09/27/19 1726      OT LONG TERM GOAL #1   Title  L hand incision and scar improve for pt to tolerate different textures and gripping tools to use at home    Baseline  incision still open and moist    Time  6    Period  Weeks    Status  New    Target Date  11/08/19      OT LONG TERM GOAL #2   Title  L grip and prehension strength increase to more than 50%  compare to R hand to carry 10 lbs ,grip steering wheel    Baseline  NT- incision open still    Time  8    Period  Weeks    Status  New    Target Date  11/22/19      OT LONG TERM GOAL #3   Title  Function score on PRWHE improve with more than 30 points    Baseline  at eval PRWHE score on function 44/50    Time  8    Period  Weeks    Status  New  Target Date  11/22/19            Plan - 10/20/19 0747    Clinical Impression Statement  Pt is 6 wks s/p L dupuytrens release - pt show progress in AROM for PIP and MC - extention on lateral side of 5th  PIP -15 - still scab on palmar scar and ulnar palm proximal to 5th MC - pt to contwith AROM and scar mobs    OT Occupational Profile and History  Problem Focused Assessment - Including review of records relating to presenting problem    Occupational performance deficits (Please refer to evaluation for details):  ADL's;IADL's;Work;Play;Leisure;Social Participation    Body  Structure / Function / Physical Skills  ADL;ROM;UE functional use;Scar mobility;Pain;IADL;Coordination;Dexterity;Skin integrity;Flexibility    Rehab Potential  Good    Clinical Decision Making  Limited treatment options, no task modification necessary    Comorbidities Affecting Occupational Performance:  None    Modification or Assistance to Complete Evaluation   No modification of tasks or assist necessary to complete eval    OT Frequency  2x / week    OT Duration  4 weeks    OT Treatment/Interventions  Self-care/ADL training;Therapeutic exercise;Patient/family education;Splinting;Paraffin;Fluidtherapy;Contrast Bath;Manual Therapy;Passive range of motion;Scar mobilization    Plan  progress in incision    OT Home Exercise Plan  see pt instruction    Consulted and Agree with Plan of Care  Patient       Patient will benefit from skilled therapeutic intervention in order to improve the following deficits and impairments:   Body Structure / Function / Physical Skills: ADL, ROM, UE functional use, Scar mobility, Pain, IADL, Coordination, Dexterity, Skin integrity, Flexibility       Visit Diagnosis: Pain in left hand  Stiffness of left hand, not elsewhere classified  Muscle weakness (generalized)  Scar condition and fibrosis of skin    Problem List Patient Active Problem List   Diagnosis Date Noted  . Esophageal dysphagia   . Thickening of esophagus   . Reflux esophagitis   . Stomach irritation   . FH: colon cancer   . Special screening for malignant neoplasms, colon   . Benign neoplasm of ascending colon   . External hemorrhoids   . Benign neoplasm of descending colon   . OSA (obstructive sleep apnea) 10/24/2016  . Hypertriglyceridemia 12/24/2009  . HYPERTENSION, BENIGN 12/24/2009    Rosalyn Gess OTR/l,CLT 10/20/2019, 8:14 AM  [place] [facility] [address] [place], [address] Phone: [phone]    Fax:  [phone]  Name: Taylor Medina MRN: [id number] Date of Birth: [birth year]

## 2019-10-20 NOTE — Patient Instructions (Signed)
same

## 2019-11-03 ENCOUNTER — Ambulatory Visit: Payer: BC Managed Care – PPO | Admitting: Occupational Therapy

## 2019-11-03 ENCOUNTER — Other Ambulatory Visit: Payer: Self-pay

## 2019-11-03 DIAGNOSIS — M25642 Stiffness of left hand, not elsewhere classified: Secondary | ICD-10-CM

## 2019-11-03 DIAGNOSIS — M6281 Muscle weakness (generalized): Secondary | ICD-10-CM

## 2019-11-03 DIAGNOSIS — M79642 Pain in left hand: Secondary | ICD-10-CM | POA: Diagnosis not present

## 2019-11-03 DIAGNOSIS — L905 Scar conditions and fibrosis of skin: Secondary | ICD-10-CM

## 2019-11-03 NOTE — Patient Instructions (Signed)
Same HEP -but can do scar massage and use Vit E Cont LMB splint on 5th PIP extention  Add teal putty for gripping - into cylinder pain free 10-12 reps - rollling putty 10 reps  2 x day  Increase to 2 sets in week

## 2019-11-03 NOTE — Therapy (Signed)
[facility] [address] [place], [address] Phone: [phone]   Fax:  [phone]  Occupational Therapy Treatment  Patient Details  Name: Taylor Medina MRN: [id number] Date of Birth: [birth year] Referring Provider (OT): Dr Roland Rack   Encounter Date: 11/03/2019  OT End of Session - 11/03/19 0753    Visit Number  6    Number of Visits  16    Date for OT Re-Evaluation  11/22/19    OT Start Time  0733    OT Stop Time  0808    OT Time Calculation (min)  35 min    Activity Tolerance  Patient tolerated treatment well    Behavior During Therapy  [facility] for tasks assessed/performed       Past Medical History:  Diagnosis Date  . Diverticulosis   . HTN (hypertension)    episodic   . Palpitations   . Sleep apnea    CPAP  . Wears contact lenses     Past Surgical History:  Procedure Laterality Date  . COLONOSCOPY WITH PROPOFOL N/A 12/10/2017   Procedure: COLONOSCOPY WITH PROPOFOL;  Surgeon: Virgel Manifold, MD;  Location: [facility];  Service: Endoscopy;  Laterality: N/A;  . 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 ESOPHAGOGASTRODUODENOSCOPY (EGD) WITH PROPOFOL  12/10/2017   Procedure: ESOPHAGOGASTRODUODENOSCOPY (EGD) WITH PROPOFOL;  Surgeon: Virgel Manifold, MD;  Location: [place] [facility];  Service: Endoscopy;;  . FRACTURE SURGERY  2005   Rod placed.  Tib/fib fracture  . hardware fractions     on R; several surgeries   . hernia repair surgery      There were no vitals filed for this visit.  Subjective Assessment - 11/03/19 0747    Subjective   My scar healing -  motion better- making fist better- using it more - but my shoulder is bothering you - I did fell and caugth myself on hand week before my surgery - shoulder feeling worse bugt now I  cannot sleep and get it comfortable    Pertinent History  Pt had L 5th and 4th digit/palm dupuytrens release on 11/25/202- sutures come out on 12/7 - pt seen Dr Roland Rack yesterday because of incision no closing in palm - open - pt is washing , dressing and bandaging hand    Patient Stated Goals  I want to incision to close , and heal - swellling and stiffness in my hand and fingers better so I can use my hand - I need to use it on computer, gripping , holding    Currently in Pain?  Yes    Pain Score  4     Pain Location  Shoulder    Pain Orientation  Left    Pain Descriptors / Indicators  Sharp;Aching    Pain Type  Surgical pain    Pain Onset  More than a month ago    Aggravating Factors   breathing, hanging , lying on side         [facility] OT Assessment - 11/03/19 0001      Left Hand AROM   L Ring  MCP 0-90  85 Degrees    L Ring PIP 0-100  100 Degrees    L Little PIP 0-100  85 Degrees    L Little DIP 0-70  95 Degrees   -20 lateral  Hand Function   Right Hand Grip (lbs)  115    Right Hand Lateral Pinch  30 lbs    Right Hand 3 Point Pinch  30 lbs    Left Hand Grip (lbs)  75    Left Hand Lateral Pinch  26 lbs    Left 3 point pinch  27 lbs      AROM about the same - still decrease PIP extention of 5th  And edema in [facility] 's decreasing MC flexion  But increase scar healing - can use Vit E          OT Treatments/Exercises (OP) - 11/03/19 0001      LUE Paraffin   Number Minutes Paraffin  8 Minutes    LUE Paraffin Location  Hand    Comments  LMB splint on 5th PIP for extention      scar massage done by OT - attempted mini massager but held off  pt to do scar mobs and cont with cica scar pad   Add this date after tendon glides putty  Teal med - for gripping into cylinder - pain free  NOT ball - cause some pain  And rolling inbetween for scar mobs and extention of digits  Pt report some L shoulder pain - fell week prior to his surgery - and still bothering him - gradually  more  Pain with ext and int rotation - decrease AROM  Tenderness anterior shoulder And deep breathing over ant shoulder and pect -  Refer PT to Dr Roland Rack - and possible PT to eval and tx         OT Education - 11/03/19 0753    Education Details  add putty to HEP    Person(s) Educated  Patient    Methods  Explanation;Demonstration;Tactile cues;Verbal cues;Handout    Comprehension  Verbal cues required;Returned demonstration;Verbalized understanding       OT Short Term Goals - 09/27/19 1723      OT SHORT TERM GOAL #1   Title  Pt to be independent in HEP to decrease edema , increase AROM while incision is healing    Baseline  no knowledge    Time  2    Period  Weeks    Status  New    Target Date  10/11/19      OT SHORT TERM GOAL #2   Title  L digits ARO M improve for pt to touch palm with pain less than 2/10    Baseline  see flowsheet- pain increase to 2-5/10    Time  4    Period  Weeks    Status  New    Target Date  10/25/19        OT Long Term Goals - 09/27/19 1726      OT LONG TERM GOAL #1   Title  L hand incision and scar improve for pt to tolerate different textures and gripping tools to use at home    Baseline  incision still open and moist    Time  6    Period  Weeks    Status  New    Target Date  11/08/19      OT LONG TERM GOAL #2   Title  L grip and prehension strength increase to more than 50%  compare to R hand to carry 10 lbs ,grip steering wheel    Baseline  NT- incision open still    Time  8    Period  Weeks  Status  New    Target Date  11/22/19      OT LONG TERM GOAL #3   Title  Function score on PRWHE improve with more than 30 points    Baseline  at eval PRWHE score on function 44/50    Time  8    Period  Weeks    Status  New    Target Date  11/22/19            Plan - 11/03/19 0753    Clinical Impression Statement  Pt is about 8 wks s/p L dupuytrens release - pt show increase scar healing - still decrease extention of PIP of 5th  - -20 this date- and decrease grip - but did improve - add putty this date but need to do rolling for scar mobs and extnetion of digits    OT Occupational Profile and History  Problem Focused Assessment - Including review of records relating to presenting problem    Occupational performance deficits (Please refer to evaluation for details):  ADL's;IADL's;Work;Play;Leisure;Social Participation    Body Structure / Function / Physical Skills  ADL;ROM;UE functional use;Scar mobility;Pain;IADL;Coordination;Dexterity;Skin integrity;Flexibility    Rehab Potential  Good    Clinical Decision Making  Limited treatment options, no task modification necessary    Comorbidities Affecting Occupational Performance:  None    Modification or Assistance to Complete Evaluation   No modification of tasks or assist necessary to complete eval    OT Frequency  Biweekly    OT Duration  4 weeks    OT Treatment/Interventions  Self-care/ADL training;Therapeutic exercise;Patient/family education;Splinting;Paraffin;Fluidtherapy;Contrast Bath;Manual Therapy;Passive range of motion;Scar mobilization    Plan  progress with HEP    OT Home Exercise Plan  see pt instruction    Consulted and Agree with Plan of Care  Patient       Patient will benefit from skilled therapeutic intervention in order to improve the following deficits and impairments:   Body Structure / Function / Physical Skills: ADL, ROM, UE functional use, Scar mobility, Pain, IADL, Coordination, Dexterity, Skin integrity, Flexibility       Visit Diagnosis: Pain in left hand  Stiffness of left hand, not elsewhere classified  Muscle weakness (generalized)  Scar condition and fibrosis of skin    Problem List Patient Active Problem List   Diagnosis Date Noted  . Esophageal dysphagia   . Thickening of esophagus   . Reflux esophagitis   . Stomach irritation   . FH: colon cancer   . Special screening for malignant neoplasms, colon   . Benign neoplasm  of ascending colon   . External hemorrhoids   . Benign neoplasm of descending colon   . OSA (obstructive sleep apnea) 10/24/2016  . Hypertriglyceridemia 12/24/2009  . HYPERTENSION, BENIGN 12/24/2009    Rosalyn Gess OTR/L,CLT 11/03/2019, 8:14 AM  [facility] [address] [place], [address] Phone: [phone]   Fax:  [phone]  Name: SHLOIMY BOREN MRN: [id number] Date of Birth: Oct 09, 1966

## 2019-11-15 ENCOUNTER — Other Ambulatory Visit: Payer: Self-pay | Admitting: Physician Assistant

## 2019-11-15 DIAGNOSIS — I10 Essential (primary) hypertension: Secondary | ICD-10-CM

## 2019-11-15 NOTE — Telephone Encounter (Signed)
Requested Prescriptions  Pending Prescriptions Disp Refills  . amLODipine (NORVASC) 10 MG tablet [Pharmacy Med Name: AMLODIPINE BESYLATE 10 MG TAB] 90 tablet 0    Sig: TAKE 1 TABLET BY MOUTH EVERY DAY     Cardiovascular:  Calcium Channel Blockers Failed - 11/15/2019  1:40 AM      Failed - Last BP in normal range    BP Readings from Last 1 Encounters:  09/07/19 (!) 136/98         Passed - Valid encounter within last 6 months    Recent Outpatient Visits          2 months ago Annual physical exam   [place], [place], PA-C   6 months ago HYPERTENSION, [place], [place], [place]   7 months ago HYPERTENSION, [place], [place], [place]   7 months ago Wound infection   [place], [place], [place]   10 months ago Frontal sinusitis, unspecified chronicity   [facility] Birdie Sons, MD      Future Appointments            In 9 months [place], [place] [facility]

## 2019-11-17 ENCOUNTER — Other Ambulatory Visit: Payer: Self-pay

## 2019-11-17 ENCOUNTER — Ambulatory Visit: Payer: BC Managed Care – PPO | Attending: Student | Admitting: Occupational Therapy

## 2019-11-17 DIAGNOSIS — M6281 Muscle weakness (generalized): Secondary | ICD-10-CM | POA: Diagnosis present

## 2019-11-17 DIAGNOSIS — M25642 Stiffness of left hand, not elsewhere classified: Secondary | ICD-10-CM | POA: Insufficient documentation

## 2019-11-17 DIAGNOSIS — L905 Scar conditions and fibrosis of skin: Secondary | ICD-10-CM

## 2019-11-17 NOTE — Therapy (Signed)
[facility] [address], [place], [address] Phone: [phone]   Fax:  [phone]  Occupational Therapy Treatment  Patient Details  Name: Taylor Medina MRN: [id number] Date of Birth: [birth year] Referring Provider (OT): Dr Roland Rack   Encounter Date: 11/17/2019  OT End of Session - 11/17/19 0750    Visit Number  7    Number of Visits  16    Date for OT Re-Evaluation  11/22/19    OT Start Time  0735    OT Stop Time  0805    OT Time Calculation (min)  30 min    Activity Tolerance  Patient tolerated treatment well    Behavior During Therapy  [facility] for tasks assessed/performed       Past Medical History:  Diagnosis Date  . Diverticulosis   . HTN (hypertension)    episodic   . Palpitations   . Sleep apnea    CPAP  . Wears contact lenses     Past Surgical History:  Procedure Laterality Date  . COLONOSCOPY WITH PROPOFOL N/A 12/10/2017   Procedure: COLONOSCOPY WITH PROPOFOL;  Surgeon: Virgel Manifold, MD;  Location: [facility];  Service: Endoscopy;  Laterality: N/A;  . 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 ESOPHAGOGASTRODUODENOSCOPY (EGD) WITH PROPOFOL  12/10/2017   Procedure: ESOPHAGOGASTRODUODENOSCOPY (EGD) WITH PROPOFOL;  Surgeon: Virgel Manifold, MD;  Location: [place] [facility];  Service: Endoscopy;;  . FRACTURE SURGERY  2005   Rod placed.  Tib/fib fracture  . hardware fractions     on R; several surgeries   . hernia repair surgery      There were no vitals filed for this visit.  Subjective Assessment - 11/17/19 0746    Subjective   My shoulder still bothering me - did not make appt with Dr Roland Rack yet - the dog pulled me last night again - scar and hand doing better- not as tender - swelling still    Pertinent History  Pt had L 5th and  4th digit/palm dupuytrens release on 11/25/202- sutures come out on 12/7 - pt seen Dr Roland Rack yesterday because of incision no closing in palm - open - pt is washing , dressing and bandaging hand    Patient Stated Goals  I want to incision to close , and heal - swellling and stiffness in my hand and fingers better so I can use my hand - I need to use it on computer, gripping , holding    Currently in Pain?  Yes    Pain Score  1     Pain Location  Hand    Pain Orientation  Left    Pain Descriptors / Indicators  Tender    Pain Type  Surgical pain    Pain Onset  More than a month ago    Pain Frequency  Intermittent         [facility] OT Assessment - 11/17/19 0001      Strength   Right Hand Grip (lbs)  115    Right Hand Lateral Pinch  30 lbs    Right Hand 3 Point Pinch  30 lbs    Left Hand Grip (lbs)  80    Left Hand Lateral Pinch  26 lbs    Left Hand 3 Point Pinch  29 lbs  Left Hand AROM   L Little PIP 0-100  85 Degrees    L Little DIP 0-70  95 Degrees   -15       AROM about the same - still decrease 5 degrees off for composite flexion at PIP and MC of 5th   but   5th PIP extention improve to -15 And edema cont to decrease  increase scar healing - can use Vit and using Scar away pad and silicon sleeve on 5th         OT Treatments/Exercises (OP) - 11/17/19 0001      LUE Paraffin   Number Minutes Paraffin  8 Minutes    LUE Paraffin Location  Hand    Comments  prior to scar mobs         scar massage done by OT - attempted mini massager done on volar 5th and ulnar side - but did by hand palm scar   pt to do scar mobs and cont scar away pad    tendon glides done   Teal med putty but only to do rolling for digits extention stretch and not gripping - some pain in volar wrist   And rolling putty inbetween for scar mobs and extention of digits  Pt report some L shoulder pain - fell week prior to his surgery - and still bothering him - gradually more  Pain with ext and int  rotation - decrease AROM  Tenderness anterior shoulder And deep breathing over ant shoulder and pect -  Refer PT to Dr Roland Rack - and possible PT to eval and tx        OT Education - 11/17/19 0750    Education Details  HEP - and to see Dr Roland Rack for shoulder    Person(s) Educated  Patient    Methods  Explanation;Demonstration;Tactile cues;Verbal cues;Handout    Comprehension  Verbal cues required;Returned demonstration;Verbalized understanding       OT Short Term Goals - 09/27/19 1723      OT SHORT TERM GOAL #1   Title  Pt to be independent in HEP to decrease edema , increase AROM while incision is healing    Baseline  no knowledge    Time  2    Period  Weeks    Status  New    Target Date  10/11/19      OT SHORT TERM GOAL #2   Title  L digits ARO M improve for pt to touch palm with pain less than 2/10    Baseline  see flowsheet- pain increase to 2-5/10    Time  4    Period  Weeks    Status  New    Target Date  10/25/19        OT Long Term Goals - 09/27/19 1726      OT LONG TERM GOAL #1   Title  L hand incision and scar improve for pt to tolerate different textures and gripping tools to use at home    Baseline  incision still open and moist    Time  6    Period  Weeks    Status  New    Target Date  11/08/19      OT LONG TERM GOAL #2   Title  L grip and prehension strength increase to more than 50%  compare to R hand to carry 10 lbs ,grip steering wheel    Baseline  NT- incision open still    Time  8  Period  Weeks    Status  New    Target Date  11/22/19      OT LONG TERM GOAL #3   Title  Function score on PRWHE improve with more than 30 points    Baseline  at eval PRWHE score on function 44/50    Time  8    Period  Weeks    Status  New    Target Date  11/22/19            Plan - 11/17/19 0750    Clinical Impression Statement  Pt is about 10 wks s/p L dupuytrens release - pt had delay scar healing after incision busted open - but doing very well -  extention of PIP of 5th -15 this date , and composite flexion only off by 5 degrees at PIP and MC of 5th - edema coming down and scar tissue - pt to cont with HEP for 3 wks and return for possible last session - pt do complain of shoulder pain since fall - pt to make appt with Dr Roland Rack for assessment    OT Occupational Profile and History  Problem Focused Assessment - Including review of records relating to presenting problem    Occupational performance deficits (Please refer to evaluation for details):  ADL's;IADL's;Work;Play;Leisure;Social Participation    Body Structure / Function / Physical Skills  ADL;ROM;UE functional use;Scar mobility;Pain;IADL;Coordination;Dexterity;Skin integrity;Flexibility    Rehab Potential  Good    Clinical Decision Making  Limited treatment options, no task modification necessary    Comorbidities Affecting Occupational Performance:  None    Modification or Assistance to Complete Evaluation   No modification of tasks or assist necessary to complete eval    OT Frequency  --   3 wks   OT Duration  --   3 wks   OT Treatment/Interventions  Self-care/ADL training;Therapeutic exercise;Patient/family education;Splinting;Paraffin;Fluidtherapy;Contrast Bath;Manual Therapy;Passive range of motion;Scar mobilization    Plan  check back in 3 wks - possible discharge    OT Home Exercise Plan  see pt instruction    Consulted and Agree with Plan of Care  Patient       Patient will benefit from skilled therapeutic intervention in order to improve the following deficits and impairments:   Body Structure / Function / Physical Skills: ADL, ROM, UE functional use, Scar mobility, Pain, IADL, Coordination, Dexterity, Skin integrity, Flexibility       Visit Diagnosis: Stiffness of left hand, not elsewhere classified  Muscle weakness (generalized)  Scar condition and fibrosis of skin    Problem List Patient Active Problem List   Diagnosis Date Noted  . Esophageal dysphagia    . Thickening of esophagus   . Reflux esophagitis   . Stomach irritation   . FH: colon cancer   . Special screening for malignant neoplasms, colon   . Benign neoplasm of ascending colon   . External hemorrhoids   . Benign neoplasm of descending colon   . OSA (obstructive sleep apnea) 10/24/2016  . Hypertriglyceridemia 12/24/2009  . HYPERTENSION, BENIGN 12/24/2009    Rosalyn Gess OTR/L,CLT 11/17/2019, 9:39 AM  [facility] [address] [place], [address] Phone: [phone]   Fax:  [phone]  Name: Taylor Medina MRN: [id number] Date of Birth: [birth year]

## 2019-11-17 NOTE — Patient Instructions (Signed)
Cont same HEP - but do not do gripping of putty - do rolling only  And focus on scar mobs , scar pad and silicon sleeve for 5th digit And tendon glides

## 2019-12-07 ENCOUNTER — Encounter: Payer: Self-pay | Admitting: Occupational Therapy

## 2019-12-08 ENCOUNTER — Ambulatory Visit: Payer: BC Managed Care – PPO | Admitting: Occupational Therapy

## 2019-12-15 ENCOUNTER — Encounter: Payer: BC Managed Care – PPO | Admitting: Occupational Therapy

## 2019-12-26 ENCOUNTER — Other Ambulatory Visit: Payer: Self-pay | Admitting: Physician Assistant

## 2019-12-26 DIAGNOSIS — I10 Essential (primary) hypertension: Secondary | ICD-10-CM

## 2019-12-27 MED ORDER — AMLODIPINE BESYLATE 10 MG PO TABS: 10.0000 mg | ORAL_TABLET | Freq: Every day | ORAL | 0 refills | Status: DC

## 2020-06-30 ENCOUNTER — Other Ambulatory Visit: Payer: Self-pay | Admitting: Physician Assistant

## 2020-06-30 DIAGNOSIS — I10 Essential (primary) hypertension: Secondary | ICD-10-CM

## 2020-06-30 NOTE — Telephone Encounter (Signed)
Requested medications are due for refill today?  Yes  Requested medications are on active medication list?  Yes  Last Refill:   12/27/2019  # 90 with no refills   Future visit scheduled?  No   Notes to Clinic:  Medication failed Rx refill protocol due to no valid encounter in the past 6 months.  Last visit was 10 months ago.

## 2020-07-03 NOTE — Telephone Encounter (Signed)
L.O.V. was on 08/24/19 and no upcoming visit.

## 2020-08-22 ENCOUNTER — Other Ambulatory Visit: Payer: Self-pay

## 2020-08-22 ENCOUNTER — Other Ambulatory Visit: Payer: BC Managed Care – PPO

## 2020-08-22 ENCOUNTER — Other Ambulatory Visit: Payer: Self-pay | Admitting: *Deleted

## 2020-08-22 DIAGNOSIS — N138 Other obstructive and reflux uropathy: Secondary | ICD-10-CM

## 2020-08-22 DIAGNOSIS — N401 Enlarged prostate with lower urinary tract symptoms: Secondary | ICD-10-CM

## 2020-08-23 LAB — PSA: Prostate Specific Ag, Serum: 0.2 ng/mL (ref 0.0–4.0)

## 2020-08-28 NOTE — Progress Notes (Signed)
08/29/2020 8:53 AM   Taylor Medina March 29, 1966 [id number]  Referring provider: Trinna Post, PA-C [address] [place],  [place] [address]  Chief Complaint  Patient presents with  . Erectile Dysfunction    HPI: Taylor Medina is a 54 year old male with ED who presents today for a medication refill.  Erectile dysfunction SHIM score: 17  Previous SHIM score: 14 Main complaint: Lack of firmness x several years Risk factors:  age, BPH, HTN, HLD and sleep apnea.     Patient still having spontaneous erections.  He denies any pain or curvature with erections.  Tried: Cialis 20 mg with good results    SHIM    Row Name 08/29/20 0852         SHIM: Over the last 6 months:   How do you rate your confidence that you could get and keep an erection? Moderate     When you had erections with sexual stimulation, how often were your erections hard enough for penetration (entering your partner)? Most Times (much more than half the time)     During sexual intercourse, how often were you able to maintain your erection after you had penetrated (entered) your partner? Sometimes (about half the time)     During sexual intercourse, how difficult was it to maintain your erection to completion of intercourse? Difficult     When you attempted sexual intercourse, how often was it satisfactory for you? Most Times (much more than half the time)       SHIM Total Score   SHIM 17            Score: 1-7 Severe ED 8-11 Moderate ED 12-16 Mild-Moderate ED 17-21 Mild ED 22-25 No ED  BPH WITH LUTS  (prostate and/or bladder) IPSS score: 5/2     Major complaint(s):  None.  Denies any dysuria, hematuria or suprapubic pain.    Denies any recent fevers, chills, nausea or vomiting.  His has no family history of prostate cancer.     IPSS    Row Name 08/29/20 0800         International Prostate Symptom Score   How often have you had the sensation of not emptying your bladder? Not at All      How often have you had to urinate less than every two hours? Not at All     How often have you found you stopped and started again several times when you urinated? Less than half the time     How often have you found it difficult to postpone urination? Not at All     How often have you had a weak urinary stream? Less than half the time     How often have you had to strain to start urination? Not at All     How many times did you typically get up at night to urinate? 1 Time     Total IPSS Score 5       Quality of Life due to urinary symptoms   If you were to spend the rest of your life with your urinary condition just the way it is now how would you feel about that? Mostly Satisfied            Score:  1-7 Mild 8-19 Moderate 20-35 Severe  PMH: Past Medical History:  Diagnosis Date  . Diverticulosis   . HTN (hypertension)    episodic   . Palpitations   . Sleep apnea  CPAP  . Wears contact lenses     Surgical History: Past Surgical History:  Procedure Laterality Date  . COLONOSCOPY WITH PROPOFOL N/A 12/10/2017   Procedure: COLONOSCOPY WITH PROPOFOL;  Surgeon: Virgel Manifold, MD;  Location: [facility] ENDOSCOPY;  Service: Endoscopy;  Laterality: N/A;  . 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 ESOPHAGOGASTRODUODENOSCOPY (EGD) WITH PROPOFOL  12/10/2017   Procedure: ESOPHAGOGASTRODUODENOSCOPY (EGD) WITH PROPOFOL;  Surgeon: Virgel Manifold, MD;  Location: [facility]COPY;  Service: Endoscopy;;  . FRACTURE SURGERY  2005   Rod placed.  Tib/fib fracture  . hardware fractions     on R; several surgeries   . hernia repair surgery      Home Medications:  Allergies as of 08/29/2020      Reactions   Influenza Vaccines    Guillain barre syndrome      Medication List       Accurate as of August 29, 2020  8:53 AM.  If you have any questions, ask your nurse or doctor.        amLODipine 10 MG tablet Commonly known as: NORVASC TAKE 1 TABLET BY MOUTH EVERY DAY   gabapentin 300 MG capsule Commonly known as: NEURONTIN Take 1 capsule (300 mg total) by mouth 3 (three) times daily.   HYDROcodone-acetaminophen 5-325 MG tablet Commonly known as: Norco Take 1-2 tablets by mouth every 6 (six) hours as needed for moderate pain or severe pain.   tadalafil 20 MG tablet Commonly known as: CIALIS Take 1 tablet (20 mg total) by mouth daily as needed for erectile dysfunction.   Vascepa 1 g capsule Generic drug: icosapent Ethyl Take 2 capsules (2 g total) by mouth 2 (two) times daily.       Allergies:  Allergies  Allergen Reactions  . Influenza Vaccines     Guillain barre syndrome    Family History: Family History  Problem Relation Age of Onset  . Cancer Other        family hx  . Coronary artery disease Other        family hx  . Diabetes Other        family hx    Social History:  reports that he has been smoking cigarettes. He has been smoking about 0.50 packs per day. He has never used smokeless tobacco. He reports current alcohol use of about 12.0 standard drinks of alcohol per week. He reports that he does not use drugs.  ROS: For pertinent review of systems please refer to history of present illness  Physical Exam: BP (!) 147/89 (BP Location: Left Arm, Patient Position: Sitting, Cuff Size: Normal)   Pulse 76   Ht 5\' 9"  (1.753 m)   Wt 188 lb (85.3 kg)   BMI 27.76 kg/m   Constitutional:  Well nourished. Alert and oriented, No acute distress. HEENT: [place] AT, mask in place.  Trachea midline Cardiovascular: No clubbing, cyanosis, or edema. Respiratory: Normal respiratory effort, no increased work of breathing. GU: No CVA tenderness.  No bladder fullness or masses.  Patient with circumcised phallus.  Urethral meatus is patent.  No penile discharge. No penile lesions or rashes. Scrotum  without lesions, cysts, rashes and/or edema.  Testicles are located scrotally bilaterally. No masses are appreciated in the testicles. Left and right epididymis are normal. Rectal: Patient with  normal sphincter tone. Anus and perineum without scarring or  rashes. External hemorrhoids are noted.  Prostate is approximately 50 grams, could only palpate the apex and midportion of the gland, no nodules are appreciated. Seminal vesicles are normal. Skin: No rashes, bruises or suspicious lesions. Lymph: No inguinal adenopathy. Neurologic: Grossly intact, no focal deficits, moving all 4 extremities. Psychiatric: Normal mood and affect.  Laboratory Data: Urinalysis Component     Latest Ref Rng & Units 08/29/2020  Specific Gravity, UA     1.005 - 1.030 1.025  pH, UA     5.0 - 7.5 5.5  Color, UA     Yellow Orange  Appearance Ur     Clear Hazy (A)  Leukocytes,UA     Negative Negative  Protein,UA     Negative/Trace Trace (A)  Glucose, UA     Negative Negative  Ketones, UA     Negative Trace (A)  RBC, UA     Negative Negative  Bilirubin, UA     Negative Negative  Urobilinogen, Ur     0.2 - 1.0 mg/dL 0.2  Nitrite, UA     Negative Negative  Microscopic Examination      See below:   Component     Latest Ref Rng & Units 08/29/2020  WBC, UA     0 - 5 /hpf 0-5  RBC     0 - 2 /hpf None seen  Epithelial Cells (non renal)     0 - 10 /hpf 0-10  Mucus, UA     Not Estab. Present (A)  Bacteria, UA     None seen/Few None seen    Lab Results  Component Value Date   WBC 6.1 04/07/2019   HGB 18.3 (H) 04/07/2019   HCT 53.0 (H) 04/07/2019   MCV 95 04/07/2019   PLT 203 04/07/2019    Lab Results  Component Value Date   CREATININE 0.91 08/24/2019   Lab Results  Component Value Date   TESTOSTERONE 305 08/08/2016   Component     Latest Ref Rng & Units 08/30/2019 08/22/2020  Prostate Specific Ag, Serum     0.0 - 4.0 ng/mL 0.4 0.2   I have reviewed the labs.   Assessment & Plan:     1. Erectile dysfunction SHIM score is 17, it is improved Continue Cialis 20 mg on demand dosing-refill sent to [facility] in 12 months for repeat SHIM score and exam   2. BPH with LU TS I PSS 5/2 Continue conservative management, avoiding bladder irritants and timed voiding's No complaints at this time RTC in 12 months for IPSS, PSA and exam   Zara Council, PA-C  [facility] [address] [place], [place] [address] [phone]

## 2020-08-29 ENCOUNTER — Ambulatory Visit (INDEPENDENT_AMBULATORY_CARE_PROVIDER_SITE_OTHER): Payer: BC Managed Care – PPO | Admitting: Urology

## 2020-08-29 ENCOUNTER — Encounter: Payer: Self-pay | Admitting: Urology

## 2020-08-29 ENCOUNTER — Other Ambulatory Visit: Payer: Self-pay

## 2020-08-29 VITALS — BP 147/89 | HR 76 | Ht 69.0 in | Wt 188.0 lb

## 2020-08-29 DIAGNOSIS — N401 Enlarged prostate with lower urinary tract symptoms: Secondary | ICD-10-CM | POA: Diagnosis not present

## 2020-08-29 DIAGNOSIS — N5201 Erectile dysfunction due to arterial insufficiency: Secondary | ICD-10-CM

## 2020-08-29 DIAGNOSIS — N138 Other obstructive and reflux uropathy: Secondary | ICD-10-CM | POA: Diagnosis not present

## 2020-08-29 MED ORDER — TADALAFIL 20 MG PO TABS: 20.0000 mg | ORAL_TABLET | Freq: Every day | ORAL | 6 refills | Status: DC | PRN

## 2020-08-30 LAB — URINALYSIS, COMPLETE
Bilirubin, UA: NEGATIVE
Glucose, UA: NEGATIVE
Leukocytes,UA: NEGATIVE
Nitrite, UA: NEGATIVE
RBC, UA: NEGATIVE
Specific Gravity, UA: 1.025 (ref 1.005–1.030)
Urobilinogen, Ur: 0.2 mg/dL (ref 0.2–1.0)
pH, UA: 5.5 (ref 5.0–7.5)

## 2020-08-30 LAB — MICROSCOPIC EXAMINATION
Bacteria, UA: NONE SEEN
RBC, Urine: NONE SEEN /hpf (ref 0–2)

## 2020-09-27 ENCOUNTER — Other Ambulatory Visit: Payer: Self-pay | Admitting: Physician Assistant

## 2020-09-27 DIAGNOSIS — I10 Essential (primary) hypertension: Secondary | ICD-10-CM

## 2020-09-27 NOTE — Telephone Encounter (Signed)
Requested medications are due for refill today yes  Requested medications are on the active medication list yes  Last refill 9/21  Last visit 08/2019  Future visit scheduled no  Notes to clinic Already had a curtesy refill, no upcoming visit scheduled.

## 2020-10-20 ENCOUNTER — Other Ambulatory Visit: Payer: Self-pay | Admitting: Physician Assistant

## 2020-10-20 DIAGNOSIS — I10 Essential (primary) hypertension: Secondary | ICD-10-CM

## 2020-10-21 NOTE — Telephone Encounter (Signed)
Requested medication (s) are due for refill today: yes  Requested medication (s) are on the active medication list: yes  Last refill:  09/27/20 courtesy refill  Future visit scheduled: no  Notes to clinic:  needs appt. Sent MyChart message to call office to schedule appt for further refills. (Just wanted to remind you that you are due for an office visit for medication refill. The medication is amlodipine. Please call the office to schedule. Thank you!    Requested Prescriptions  Pending Prescriptions Disp Refills   amLODipine (NORVASC) 10 MG tablet [Pharmacy Med Name: AMLODIPINE BESYLATE 10 MG TAB] 30 tablet 0    Sig: TAKE 1 TABLET BY MOUTH EVERY DAY. NEED TO MAKE APPT WITH DOCTOR      Cardiovascular:  Calcium Channel Blockers Failed - 10/20/2020 10:31 AM      Failed - Last BP in normal range    BP Readings from Last 1 Encounters:  08/29/20 (!) 147/89          Failed - Valid encounter within last 6 months    Recent Outpatient Visits           1 year ago Annual physical exam   [facility], Wendee Beavers, [place]   1 year ago HYPERTENSION, [facility], Wendee Beavers, [place]   1 year ago HYPERTENSION, [facility], Wendee Beavers, [place]   1 year ago Wound infection   [facility], [place], [place]   1 year ago Frontal sinusitis, unspecified chronicity   [facility] Birdie Sons, MD       Future Appointments             In 10 months [place], [facility]

## 2020-10-29 ENCOUNTER — Telehealth (INDEPENDENT_AMBULATORY_CARE_PROVIDER_SITE_OTHER): Payer: BC Managed Care – PPO | Admitting: Physician Assistant

## 2020-10-29 ENCOUNTER — Encounter: Payer: Self-pay | Admitting: Physician Assistant

## 2020-10-29 ENCOUNTER — Telehealth: Payer: Self-pay

## 2020-10-29 VITALS — BP 148/97 | Wt 180.0 lb

## 2020-10-29 DIAGNOSIS — E781 Pure hyperglyceridemia: Secondary | ICD-10-CM | POA: Diagnosis not present

## 2020-10-29 DIAGNOSIS — I10 Essential (primary) hypertension: Secondary | ICD-10-CM | POA: Diagnosis not present

## 2020-10-29 DIAGNOSIS — R748 Abnormal levels of other serum enzymes: Secondary | ICD-10-CM | POA: Diagnosis not present

## 2020-10-29 DIAGNOSIS — K760 Fatty (change of) liver, not elsewhere classified: Secondary | ICD-10-CM

## 2020-10-29 MED ORDER — LISINOPRIL-HYDROCHLOROTHIAZIDE 10-12.5 MG PO TABS: 1.0000 | ORAL_TABLET | Freq: Every day | ORAL | 0 refills | Status: DC

## 2020-10-29 NOTE — Telephone Encounter (Signed)
Copied from [place] [id number]. Topic: General - Other >> Oct 29, 2020  4:04 PM Celene Kras wrote: Reason for CRM: Pt called stating that he is not able to access the mychart video. He states that it pops up as an error. Please advise.

## 2020-10-29 NOTE — Patient Instructions (Signed)

## 2020-10-29 NOTE — Progress Notes (Signed)
MyChart Video Visit    Virtual Visit via Video Note   This visit type was conducted due to national recommendations for restrictions regarding the COVID-19 Pandemic (e.g. social distancing) in an effort to limit this patient's exposure and mitigate transmission in our community. This patient is at least at moderate risk for complications without adequate follow up. This format is felt to be most appropriate for this patient at this time. Physical exam was limited by quality of the video and audio technology used for the visit.   Patient location: Home Provider location: Office   I discussed the limitations of evaluation and management by telemedicine and the availability of in person appointments. The patient expressed understanding and agreed to proceed.  Patient: Taylor Medina   DOB: 1966-06-14   55 y.o. Male  MRN: [id number] Visit Date: 10/29/2020  Today's healthcare provider: Trey SailorsAdriana M Pollak, PA-C   Chief Complaint  Patient presents with  . Hypertension   Subjective    HPI  Hypertension, follow-up  BP Readings from Last 3 Encounters:  10/29/20 (!) 148/97  08/29/20 (!) 147/89  09/07/19 (!) 136/98   Wt Readings from Last 3 Encounters:  10/29/20 180 lb (81.6 kg)  08/29/20 188 lb (85.3 kg)  09/07/19 180 lb (81.6 kg)     He was last seen for hypertension 1 years ago.  BP at that visit was 151/98. Management since that visit includes no changes continue Amlodipine 10 mg daily.   He reports excellent compliance with treatment. He is not having side effects.  He is following a Regular diet. He is not exercising. He does not smoke.  Use of agents associated with hypertension: Nyquil cold and sinus.   Outside blood pressures are stable. Symptoms: No chest pain No chest pressure  No palpitations No syncope  No dyspnea No orthopnea  No paroxysmal nocturnal dyspnea No lower extremity edema   Pertinent labs: Lab Results  Component Value Date   CHOL 227 (H)  08/24/2019   HDL 32 (L) 08/24/2019   LDLCALC 98 08/24/2019   LDLDIRECT 99 04/07/2019   TRIG 573 (HH) 08/24/2019   CHOLHDL 7.1 (H) 08/24/2019   Lab Results  Component Value Date   NA 139 08/24/2019   K 4.5 08/24/2019   CREATININE 0.91 08/24/2019   GFRNONAA 97 08/24/2019   GFRAA 112 08/24/2019   GLUCOSE 98 08/24/2019     The 10-year ASCVD risk score Denman George(Goff DC Jr., et al., 2013) is: 22.6%   --------------------------------------------------------------------------------------------------- Lipid/Cholesterol, Follow-up  Last lipid panel Other pertinent labs  Lab Results  Component Value Date   CHOL 227 (H) 08/24/2019   HDL 32 (L) 08/24/2019   LDLCALC 98 08/24/2019   LDLDIRECT 99 04/07/2019   TRIG 573 (HH) 08/24/2019   CHOLHDL 7.1 (H) 08/24/2019   Lab Results  Component Value Date   ALT 146 (H) 08/24/2019   AST 79 (H) 08/24/2019   PLT 203 04/07/2019   TSH 1.770 04/07/2019     He was last seen for this 1 years ago.  Management since that visit includes start vascepa 2 mg BID. Statin was deferred due to elevated liver enzyme.   He reports poor compliance with treatment. He is having side effects. Burping. Has since discontinued this medication.  Symptoms: No chest pain No chest pressure/discomfort  No dyspnea No lower extremity edema  No numbness or tingling of extremity No orthopnea  No palpitations No paroxysmal nocturnal dyspnea  No speech difficulty No syncope   Current diet:  well balanced Current exercise: none  The 10-year ASCVD risk score Mikey Bussing DC Jr., et al., 2013) is: 22.6%  ---------------------------------------------------------------------------------------------------  Elevated Liver Enzymes: Present since at least 2016. [place] 05/2019 showed fatty liver. Hepatitis testing negative. Drinks 1/2 to full bottle of wine daily and continues to do so.   CMP Latest Ref Rng & Units 08/24/2019 05/19/2019 04/07/2019  Glucose 65 - 99 mg/dL 98 102(H) 101(H)  BUN 6 - 24  mg/dL 16 12 14   Creatinine 0.76 - 1.27 mg/dL 0.91 0.90 0.91  Sodium 134 - 144 mmol/L 139 138 137  Potassium 3.5 - 5.2 mmol/L 4.5 4.4 4.4  Chloride 96 - 106 mmol/L 103 103 100  CO2 20 - 29 mmol/L 20 21 19(L)  Calcium 8.7 - 10.2 mg/dL 9.6 9.2 9.8  Total Protein 6.0 - 8.5 g/dL 6.7 6.7 7.2  Total Bilirubin 0.0 - 1.2 mg/dL 0.6 0.6 0.5  Alkaline Phos 39 - 117 IU/L 73 67 68  AST 0 - 40 IU/L 79(H) 101(H) 78(H)  ALT 0 - 44 IU/L 146(H) 177(H) 142(H)    Patient Active Problem List   Diagnosis Date Noted  . Esophageal dysphagia   . Thickening of esophagus   . Reflux esophagitis   . Stomach irritation   . FH: colon cancer   . Special screening for malignant neoplasms, colon   . Benign neoplasm of ascending colon   . External hemorrhoids   . Benign neoplasm of descending colon   . OSA (obstructive sleep apnea) 10/24/2016  . Hypertriglyceridemia 12/24/2009  . HYPERTENSION, BENIGN 12/24/2009   Social History   Tobacco Use  . Smoking status: Current Every Day Smoker    Packs/day: 0.50    Types: Cigarettes    Last attempt to quit: 07/28/2013    Years since quitting: 7.2  . Smokeless tobacco: Never Used  . Tobacco comment: smoked for approx 12 yrs. quit for 3. Restarted several months ago  Vaping Use  . Vaping Use: Never used  Substance Use Topics  . Alcohol use: Yes    Alcohol/week: 12.0 standard drinks    Types: 12 Glasses of wine per week  . Drug use: No   Allergies  Allergen Reactions  . Influenza Vaccines     Guillain barre syndrome      Medications: Outpatient Medications Prior to Visit  Medication Sig  . amLODipine (NORVASC) 10 MG tablet TAKE 1 TABLET BY MOUTH EVERY DAY  . tadalafil (CIALIS) 20 MG tablet Take 1 tablet (20 mg total) by mouth daily as needed for erectile dysfunction.  . [DISCONTINUED] Icosapent Ethyl (VASCEPA) 1 g CAPS Take 2 capsules (2 g total) by mouth 2 (two) times daily.  . [DISCONTINUED] gabapentin (NEURONTIN) 300 MG capsule Take 1 capsule (300 mg  total) by mouth 3 (three) times daily. (Patient not taking: Reported on 08/29/2020)  . [DISCONTINUED] HYDROcodone-acetaminophen (NORCO) 5-325 MG tablet Take 1-2 tablets by mouth every 6 (six) hours as needed for moderate pain or severe pain. (Patient not taking: Reported on 08/29/2020)   No facility-administered medications prior to visit.    Review of Systems  Constitutional: Negative.   Respiratory: Negative.   Cardiovascular: Negative.   Gastrointestinal: Negative.     Last CBC Lab Results  Component Value Date   WBC 6.1 04/07/2019   HGB 18.3 (H) 04/07/2019   HCT 53.0 (H) 04/07/2019   MCV 95 04/07/2019   MCH 32.8 04/07/2019   RDW 13.2 04/07/2019   PLT 203 [id number]   Last metabolic panel Lab  Results  Component Value Date   GLUCOSE 98 08/24/2019   NA 139 08/24/2019   K 4.5 08/24/2019   CL 103 08/24/2019   CO2 20 08/24/2019   BUN 16 08/24/2019   CREATININE 0.91 08/24/2019   GFRNONAA 97 08/24/2019   GFRAA 112 08/24/2019   CALCIUM 9.6 08/24/2019   PROT 6.7 08/24/2019   ALBUMIN 4.1 08/24/2019   LABGLOB 2.6 08/24/2019   AGRATIO 1.6 08/24/2019   BILITOT 0.6 08/24/2019   ALKPHOS 73 08/24/2019   AST 79 (H) 08/24/2019   ALT 146 (H) 08/24/2019   ANIONGAP 11 01/31/2015   Last lipids Lab Results  Component Value Date   CHOL 227 (H) 08/24/2019   HDL 32 (L) 08/24/2019   LDLCALC 98 08/24/2019   LDLDIRECT 99 04/07/2019   TRIG 573 (HH) 08/24/2019   CHOLHDL 7.1 (H) 08/24/2019   Last thyroid functions Lab Results  Component Value Date   TSH 1.770 04/07/2019      Objective    BP (!) 148/97   Wt 180 lb (81.6 kg)   BMI 26.58 kg/m  BP Readings from Last 3 Encounters:  10/29/20 (!) 148/97  08/29/20 (!) 147/89  09/07/19 (!) 136/98   Wt Readings from Last 3 Encounters:  10/29/20 180 lb (81.6 kg)  08/29/20 188 lb (85.3 kg)  09/07/19 180 lb (81.6 kg)      Physical Exam Constitutional:      Appearance: Normal appearance.  Pulmonary:     Effort: Pulmonary  effort is normal. No respiratory distress.  Neurological:     Mental Status: He is alert.  Psychiatric:        Mood and Affect: Mood normal.        Behavior: Behavior normal.        Assessment & Plan    1. HYPERTENSION, BENIGN  More uncontrolled recently. Advised to discontinue Nyquil cold and sinus. If BP remains elevated after one week, start medication below. F/u 6 weeks for CPE and chronic.  - TSH - Lipid panel - Comprehensive metabolic panel - CBC with Differential/Platelet - lisinopril-hydrochlorothiazide (ZESTORETIC) 10-12.5 MG tablet; Take 1 tablet by mouth daily.  Dispense: 90 tablet; Refill: 0  2. Hypertriglyceridemia  Discontinued vascepa. Not a candidate for statin due to elevated liver enzymes.  - TSH - Lipid panel - Comprehensive metabolic panel - CBC with Differential/Platelet  3. Fatty liver  US 05/2019 demonstrated this, hepatitis testing negative. Suspect elevated liver enzymes due to alcohol consumption and have recommended stopping alcohol completely. Have counseled about the harms of prolonged alcohol use including cirrhosis, this is likely contributing to cholesterol as well. Patient not interested in stopping at this time.   4. Elevated liver enzymes    No follow-ups on file.     I discussed the assessment and treatment plan with the patient. The patient was provided an opportunity to ask questions and all were answered. The patient agreed with the plan and demonstrated an understanding of the instructions.   The patient was advised to call back or seek an in-person evaluation if the symptoms worsen or if the condition fails to improve as anticipated.  ITrinna Post, PA-C, have reviewed all documentation for this visit. The documentation on 10/29/20 for the exam, diagnosis, procedures, and orders are all accurate and complete.  The entirety of the information documented in the History of Present Illness, Review of Systems and Physical Exam  were personally obtained by me. Portions of this information were initially documented by Lynford Humphrey,  CMA and reviewed by me for thoroughness and accuracy.   Paulene Floor [facility] [phone] (phone) [phone] (fax)  [place]

## 2020-12-03 ENCOUNTER — Encounter: Payer: Self-pay | Admitting: Physician Assistant

## 2020-12-07 LAB — LIPID PANEL
Chol/HDL Ratio: 7 ratio — ABNORMAL HIGH (ref 0.0–5.0)
Cholesterol, Total: 223 mg/dL — ABNORMAL HIGH (ref 100–199)
HDL: 32 mg/dL — ABNORMAL LOW (ref >39)
LDL Chol Calc (NIH): 102 mg/dL — ABNORMAL HIGH (ref 0–99)
Triglycerides: 523 mg/dL — ABNORMAL HIGH (ref 0–149)
VLDL Cholesterol Cal: 89 mg/dL — ABNORMAL HIGH (ref 5–40)

## 2020-12-07 LAB — CBC WITH DIFFERENTIAL/PLATELET
Basophils Absolute: 0 10*3/uL (ref 0.0–0.2)
Basos: 0 %
EOS (ABSOLUTE): 0.3 10*3/uL (ref 0.0–0.4)
Eos: 4 %
Hematocrit: 52.6 % — ABNORMAL HIGH (ref 37.5–51.0)
Hemoglobin: 18.5 g/dL — ABNORMAL HIGH (ref 13.0–17.7)
Immature Grans (Abs): 0 10*3/uL (ref 0.0–0.1)
Immature Granulocytes: 0 %
Lymphocytes Absolute: 1.9 10*3/uL (ref 0.7–3.1)
Lymphs: 27 %
MCH: 32.5 pg (ref 26.6–33.0)
MCHC: 35.2 g/dL (ref 31.5–35.7)
MCV: 92 fL (ref 79–97)
Monocytes Absolute: 0.7 10*3/uL (ref 0.1–0.9)
Monocytes: 10 %
Neutrophils Absolute: 4 10*3/uL (ref 1.4–7.0)
Neutrophils: 59 %
Platelets: 202 10*3/uL (ref 150–450)
RBC: 5.69 x10E6/uL (ref 4.14–5.80)
RDW: 12.2 % (ref 11.6–15.4)
WBC: 7 10*3/uL (ref 3.4–10.8)

## 2020-12-07 LAB — COMPREHENSIVE METABOLIC PANEL
ALT: 142 IU/L — ABNORMAL HIGH (ref 0–44)
AST: 78 IU/L — ABNORMAL HIGH (ref 0–40)
Albumin/Globulin Ratio: 1.5 (ref 1.2–2.2)
Albumin: 4.2 g/dL (ref 3.8–4.9)
Alkaline Phosphatase: 69 IU/L (ref 44–121)
BUN/Creatinine Ratio: 16 (ref 9–20)
BUN: 15 mg/dL (ref 6–24)
Bilirubin Total: 0.5 mg/dL (ref 0.0–1.2)
CO2: 18 mmol/L — ABNORMAL LOW (ref 20–29)
Calcium: 9.7 mg/dL (ref 8.7–10.2)
Chloride: 104 mmol/L (ref 96–106)
Creatinine, Ser: 0.95 mg/dL (ref 0.76–1.27)
GFR calc Af Amer: 106 mL/min/{1.73_m2} (ref >59)
GFR calc non Af Amer: 92 mL/min/{1.73_m2} (ref >59)
Globulin, Total: 2.8 g/dL (ref 1.5–4.5)
Glucose: 99 mg/dL (ref 65–99)
Potassium: 4.7 mmol/L (ref 3.5–5.2)
Sodium: 136 mmol/L (ref 134–144)
Total Protein: 7 g/dL (ref 6.0–8.5)

## 2020-12-07 LAB — TSH: TSH: 1.4 u[IU]/mL (ref 0.450–4.500)

## 2020-12-10 ENCOUNTER — Other Ambulatory Visit: Payer: Self-pay

## 2020-12-10 ENCOUNTER — Encounter: Payer: Self-pay | Admitting: Physician Assistant

## 2020-12-10 ENCOUNTER — Ambulatory Visit (INDEPENDENT_AMBULATORY_CARE_PROVIDER_SITE_OTHER): Payer: BC Managed Care – PPO | Admitting: Physician Assistant

## 2020-12-10 VITALS — BP 111/74 | HR 80 | Temp 98.9°F | Ht 70.0 in | Wt 189.4 lb

## 2020-12-10 DIAGNOSIS — R748 Abnormal levels of other serum enzymes: Secondary | ICD-10-CM | POA: Diagnosis not present

## 2020-12-10 DIAGNOSIS — I10 Essential (primary) hypertension: Secondary | ICD-10-CM | POA: Diagnosis not present

## 2020-12-10 DIAGNOSIS — R519 Headache, unspecified: Secondary | ICD-10-CM

## 2020-12-10 DIAGNOSIS — D582 Other hemoglobinopathies: Secondary | ICD-10-CM

## 2020-12-10 DIAGNOSIS — E785 Hyperlipidemia, unspecified: Secondary | ICD-10-CM | POA: Diagnosis not present

## 2020-12-10 DIAGNOSIS — R809 Proteinuria, unspecified: Secondary | ICD-10-CM | POA: Diagnosis not present

## 2020-12-10 DIAGNOSIS — Z0000 Encounter for general adult medical examination without abnormal findings: Secondary | ICD-10-CM

## 2020-12-10 DIAGNOSIS — Z23 Encounter for immunization: Secondary | ICD-10-CM | POA: Diagnosis not present

## 2020-12-10 MED ORDER — AMLODIPINE BESYLATE 10 MG PO TABS: 10.0000 mg | ORAL_TABLET | Freq: Every day | ORAL | 1 refills | Status: DC

## 2020-12-10 MED ORDER — LISINOPRIL-HYDROCHLOROTHIAZIDE 10-12.5 MG PO TABS
1.0000 | ORAL_TABLET | Freq: Every day | ORAL | 1 refills | Status: DC
Start: 2020-12-10 — End: 2021-07-04

## 2020-12-10 MED ORDER — BUTALBITAL-APAP-CAFFEINE 50-325-40 MG PO TABS: 1.0000 | ORAL_TABLET | Freq: Four times a day (QID) | ORAL | 0 refills | Status: DC | PRN

## 2020-12-10 NOTE — Progress Notes (Signed)
Complete physical exam   Patient: Taylor Medina   DOB: 11-May-1966   55 y.o. Male  MRN: [id number] Visit Date: 12/10/2020  Today's healthcare provider: Trinna Post, PA-C   Chief Complaint  Patient presents with  . Annual Exam  . Hypertension  I,Ramsey Guadamuz M Gera Inboden,acting as a scribe for Trinna Post, PA-C.,have documented all relevant documentation on the behalf of Trinna Post, PA-C,as directed by  Trinna Post, PA-C while in the presence of Trinna Post, PA-C.  Subjective    Taylor Medina is a 55 y.o. male who presents today for a complete physical exam.  He reports consuming a general diet. Home exercise routine includes walking. He generally feels fairly well. He reports sleeping well. He does have additional problems to discuss today.  HPI  Hypertension, follow-up  BP Readings from Last 3 Encounters:  12/10/20 111/74  10/29/20 (!) 148/97  08/29/20 (!) 147/89   Wt Readings from Last 3 Encounters:  12/10/20 189 lb 6.4 oz (85.9 kg)  10/29/20 180 lb (81.6 kg)  08/29/20 188 lb (85.3 kg)     He was last seen for hypertension 1 months ago.  BP at that visit was 148/97. Management since that visit includes started lisinopril-hydrochlorothiazide (ZESTORETIC) 10-12.5 MG tablet. He had some numbness and tingling in his right index finger which ultimately broke out in a blistery rash. Thought this might be related to the new medication however he has since resumed the medication without adverse effects.   He reports good compliance with treatment. He is not having side effects.  He is following a Regular diet. He is exercising. He does smoke.  Use of agents associated with hypertension: none.   Outside blood pressures are arranging around 130's-120's/70's-80's. Symptoms: No chest pain No chest pressure  No palpitations No syncope  No dyspnea No orthopnea  No paroxysmal nocturnal dyspnea No lower extremity edema   Pertinent labs: Lab Results   Component Value Date   CHOL 223 (H) 12/06/2020   HDL 32 (L) 12/06/2020   LDLCALC 102 (H) 12/06/2020   LDLDIRECT 99 04/07/2019   TRIG 523 (H) 12/06/2020   CHOLHDL 7.0 (H) 12/06/2020   Lab Results  Component Value Date   NA 138 12/10/2020   K 4.6 12/10/2020   CREATININE 0.92 12/10/2020   GFRNONAA 92 12/06/2020   GFRAA 106 12/06/2020   GLUCOSE 82 12/10/2020     The 10-year ASCVD risk score Taylor Bussing DC Jr., et al., 2013) is: 13.8%   Drinks three 8 oz glasses of wine per day.   ---------------------------------------------------------------------------------------------------   Past Medical History:  Diagnosis Date  . Diverticulosis   . HTN (hypertension)    episodic   . Palpitations   . Sleep apnea    CPAP  . Wears contact lenses    Past Surgical History:  Procedure Laterality Date  . COLONOSCOPY WITH PROPOFOL N/A 12/10/2017   Procedure: COLONOSCOPY WITH PROPOFOL;  Surgeon: Virgel Manifold, MD;  Location: [facility] ENDOSCOPY;  Service: Endoscopy;  Laterality: N/A;  . 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 ESOPHAGOGASTRODUODENOSCOPY (EGD) WITH PROPOFOL  12/10/2017   Procedure: ESOPHAGOGASTRODUODENOSCOPY (EGD) WITH PROPOFOL;  Surgeon: Virgel Manifold, MD;  Location: [facility];  Service: Endoscopy;;  . FRACTURE SURGERY  2005   Rod placed.  Tib/fib fracture  . hardware fractions  on R; several surgeries   . hernia repair surgery     Social History   Socioeconomic History  . Marital status: Married    Spouse name: Not on file  . Number of children: Not on file  . Years of education: Not on file  . Highest education level: Not on file  Occupational History  . Not on file  Tobacco Use  . Smoking status: Current Every Day Smoker    Packs/day: 0.50    Types: Cigarettes    Last attempt to quit: 07/28/2013     Years since quitting: 7.3  . Smokeless tobacco: Never Used  . Tobacco comment: smoked for approx 12 yrs. quit for 3. Restarted several months ago  Vaping Use  . Vaping Use: Never used  Substance and Sexual Activity  . Alcohol use: Yes    Alcohol/week: 12.0 standard drinks    Types: 12 Glasses of wine per week  . Drug use: No  . Sexual activity: Not on file  Other Topics Concern  . Not on file  Social History Narrative   Single; full time; does not get regular exercise.    Social Determinants of Health   Financial Resource Strain: Not on file  Food Insecurity: Not on file  Transportation Needs: Not on file  Physical Activity: Not on file  Stress: Not on file  Social Connections: Not on file  Intimate Partner Violence: Not on file   Family Status  Relation Name Status  . Mother  Alive  . Father  Alive  . Other  (Not Specified)  . Other  (Not Specified)  . Other  (Not Specified)   Family History  Problem Relation Age of Onset  . Cancer Other        family hx  . Coronary artery disease Other        family hx  . Diabetes Other        family hx   Allergies  Allergen Reactions  . Influenza Vaccines     Guillain barre syndrome    Patient Care Team: Paulene Floor as PCP - General (Physician Assistant)   Medications: Outpatient Medications Prior to Visit  Medication Sig  . tadalafil (CIALIS) 20 MG tablet Take 1 tablet (20 mg total) by mouth daily as needed for erectile dysfunction.  . [DISCONTINUED] amLODipine (NORVASC) 10 MG tablet TAKE 1 TABLET BY MOUTH EVERY DAY  . [DISCONTINUED] lisinopril-hydrochlorothiazide (ZESTORETIC) 10-12.5 MG tablet Take 1 tablet by mouth daily.   No facility-administered medications prior to visit.    Review of Systems  Constitutional: Negative.   HENT: Negative.   Eyes: Negative.   Respiratory: Negative.   Cardiovascular: Negative.   Gastrointestinal: Negative.   Endocrine: Negative.   Genitourinary: Negative.    Musculoskeletal: Negative.   Skin: Negative.   Allergic/Immunologic: Negative.   Neurological: Negative.   Hematological: Negative.   Psychiatric/Behavioral: Negative.       Objective    BP 111/74 (BP Location: Left Arm, Patient Position: Sitting, Cuff Size: Large)   Pulse 80   Temp 98.9 F (37.2 C) (Oral)   Ht 5' 10" (1.778 m)   Wt 189 lb 6.4 oz (85.9 kg)   SpO2 97%   BMI 27.18 kg/m    Physical Exam HENT:     Right Ear: Tympanic membrane, ear canal and external ear normal.     Left Ear: Tympanic membrane, ear canal and external ear normal.  Cardiovascular:     Rate and Rhythm: Normal  rate and regular rhythm.     Pulses: Normal pulses.     Heart sounds: Normal heart sounds.  Pulmonary:     Effort: Pulmonary effort is normal.     Breath sounds: Normal breath sounds.  Abdominal:     General: Abdomen is flat. Bowel sounds are normal.     Palpations: Abdomen is soft.  Skin:    General: Skin is warm and dry.  Neurological:     General: No focal deficit present.     Mental Status: He is alert and oriented to person, place, and time.  Psychiatric:        Mood and Affect: Mood normal.        Behavior: Behavior normal.       Last depression screening scores PHQ 2/9 Scores 12/10/2020 05/19/2019  PHQ - 2 Score 0 0  PHQ- 9 Score 0 -   Last fall risk screening Fall Risk  12/10/2020  Falls in the past year? 1  Number falls in past yr: 0  Injury with Fall? 1  Risk for fall due to : No Fall Risks;History of fall(s)  Follow up Falls evaluation completed;Education provided;Falls prevention discussed   Last Audit-C alcohol use screening Alcohol Use Disorder Test (AUDIT) 12/10/2020  1. How often do you have a drink containing alcohol? 4  2. How many drinks containing alcohol do you have on a typical day when you are drinking? 1  3. How often do you have six or more drinks on one occasion? 2  AUDIT-C Score 7  4. How often during the last year have you found that you were  not able to stop drinking once you had started? 0  5. How often during the last year have you failed to do what was normally expected from you because of drinking? 0  6. How often during the last year have you needed a first drink in the morning to get yourself going after a heavy drinking session? 0  7. How often during the last year have you had a feeling of guilt of remorse after drinking? 0  8. How often during the last year have you been unable to remember what happened the night before because you had been drinking? 0  9. Have you or someone else been injured as a result of your drinking? 0  10. Has a relative or friend or a doctor or another health worker been concerned about your drinking or suggested you cut down? 0  Alcohol Use Disorder Identification Test Final Score (AUDIT) 7   A score of 3 or more in women, and 4 or more in men indicates increased risk for alcohol abuse, EXCEPT if all of the points are from question 1   Results for orders placed or performed in visit on 12/10/20  HgB A1c  Result Value Ref Range   Hgb A1c MFr Bld 5.6 4.8 - 5.6 %   Est. average glucose Bld gHb Est-mCnc 114 mg/dL  Fe+TIBC+Fer  Result Value Ref Range   Total Iron Binding Capacity 315 250 - 450 ug/dL   UIBC 221 111 - 343 ug/dL   Iron 94 38 - 169 ug/dL   Iron Saturation 30 15 - 55 %   Ferritin 459 (H) 30 - 400 ng/mL  Comprehensive Metabolic Panel (CMET)  Result Value Ref Range   Glucose 82 65 - 99 mg/dL   BUN 16 6 - 24 mg/dL   Creatinine, Ser 0.92 0.76 - 1.27 mg/dL   eGFR 100 >  59 mL/min/1.73   BUN/Creatinine Ratio 17 9 - 20   Sodium 138 134 - 144 mmol/L   Potassium 4.6 3.5 - 5.2 mmol/L   Chloride 103 96 - 106 mmol/L   CO2 18 (L) 20 - 29 mmol/L   Calcium 9.6 8.7 - 10.2 mg/dL   Total Protein 7.1 6.0 - 8.5 g/dL   Albumin 4.4 3.8 - 4.9 g/dL   Globulin, Total 2.7 1.5 - 4.5 g/dL   Albumin/Globulin Ratio 1.6 1.2 - 2.2   Bilirubin Total 0.3 0.0 - 1.2 mg/dL   Alkaline Phosphatase 86 44 - 121  IU/L   AST 51 (H) 0 - 40 IU/L   ALT 115 (H) 0 - 44 IU/L  Urine Microscopic  Result Value Ref Range   WBC, UA None seen 0 - 5 /hpf   RBC 0-2 0 - 2 /hpf   Epithelial Cells (non renal) None seen 0 - 10 /hpf   Casts None seen None seen /lpf   Bacteria, UA None seen None seen/Few    Assessment & Plan    Routine Health Maintenance and Physical Exam  Exercise Activities and Dietary recommendations Goals   None     Immunization History  Administered Date(s) Administered  . Influenza-Unspecified 05/19/2019  . Janssen (J&J) SARS-COV-2 Vaccination 08/17/2020  . Tdap 03/27/2019    Health Maintenance  Topic Date Due  . COVID-19 Vaccine (2 - Booster for [employer] series) 10/12/2020  . COLONOSCOPY (Pts 45-75yr Insurance coverage will need to be confirmed)  12/11/2027  . TETANUS/TDAP  03/26/2029  . Hepatitis C Screening  Completed  . HIV Screening  Completed  . HPV VACCINES  Aged Out    Discussed health benefits of physical activity, and encouraged him to engage in regular exercise appropriate for his age and condition.  1. Annual physical exam   2. Need for shingles vaccine  Will check with insurance.   3. Elevated liver enzymes  Consistently elevated. Patient currently drinking 24 ounces of alcohol daily which I have advised is actually 5 servings. Have advised patient previously to eliminate alcohol and continue to advise this. 05/2019 ultrasound showed fatty liver. Hepatitis testing unremarkable. He is at elevated CVD risk but unable to start statin due to persistently elevated enzymes. Will have him see GI, if they are OK, plan to start statin.   - HgB A1c - Fe+TIBC+Fer - Comprehensive Metabolic Panel (CMET) - Ambulatory referral to Gastroenterology  4. Proteinuria, unspecified type  - Urine Microscopic  5. Elevated hemoglobin ([place]  May be related to smoking, recommend he see hematology at least once since it is a departure from his baseline.  - Ambulatory referral  to Hematology  6. HYPERTENSION, BENIGN  Better controlled, continue medication.   - amLODipine (NORVASC) 10 MG tablet; Take 1 tablet (10 mg total) by mouth daily.  Dispense: 90 tablet; Refill: 1 - lisinopril-hydrochlorothiazide (ZESTORETIC) 10-12.5 MG tablet; Take 1 tablet by mouth daily.  Dispense: 90 tablet; Refill: 1  7. Nonintractable headache, unspecified chronicity pattern, unspecified headache type  Last filled 2018, advised on continued sparing use.   - butalbital-acetaminophen-caffeine (FIORICET) 50-325-40 MG tablet; Take 1-2 tablets by mouth every 6 (six) hours as needed for headache.  Dispense: 20 tablet; Refill: 0  8. Hyperlipidemia  Counseled on elevated CVD risk. Clearance from GI then plan to start statin.   Return in about 6 months (around 06/09/2021) for chronic .     I,Trinna Post PA-C, have reviewed all documentation for this visit. The  documentation on 12/12/20 for the exam, diagnosis, procedures, and orders are all accurate and complete.  The entirety of the information documented in the History of Present Illness, Review of Systems and Physical Exam were personally obtained by me. Portions of this information were initially documented by [facility] and reviewed by me for thoroughness and accuracy.     Paulene Floor  [facility] [phone] (phone) [phone] (fax)  [place]

## 2020-12-10 NOTE — Patient Instructions (Signed)

## 2020-12-11 LAB — COMPREHENSIVE METABOLIC PANEL
ALT: 115 IU/L — ABNORMAL HIGH (ref 0–44)
AST: 51 IU/L — ABNORMAL HIGH (ref 0–40)
Albumin/Globulin Ratio: 1.6 (ref 1.2–2.2)
Albumin: 4.4 g/dL (ref 3.8–4.9)
Alkaline Phosphatase: 86 IU/L (ref 44–121)
BUN/Creatinine Ratio: 17 (ref 9–20)
BUN: 16 mg/dL (ref 6–24)
Bilirubin Total: 0.3 mg/dL (ref 0.0–1.2)
CO2: 18 mmol/L — ABNORMAL LOW (ref 20–29)
Calcium: 9.6 mg/dL (ref 8.7–10.2)
Chloride: 103 mmol/L (ref 96–106)
Creatinine, Ser: 0.92 mg/dL (ref 0.76–1.27)
Globulin, Total: 2.7 g/dL (ref 1.5–4.5)
Glucose: 82 mg/dL (ref 65–99)
Potassium: 4.6 mmol/L (ref 3.5–5.2)
Sodium: 138 mmol/L (ref 134–144)
Total Protein: 7.1 g/dL (ref 6.0–8.5)
eGFR: 100 mL/min/{1.73_m2} (ref >59)

## 2020-12-11 LAB — IRON,TIBC AND FERRITIN PANEL
Ferritin: 459 ng/mL — ABNORMAL HIGH (ref 30–400)
Iron Saturation: 30 % (ref 15–55)
Iron: 94 ug/dL (ref 38–169)
Total Iron Binding Capacity: 315 ug/dL (ref 250–450)
UIBC: 221 ug/dL (ref 111–343)

## 2020-12-11 LAB — URINALYSIS, MICROSCOPIC ONLY
Bacteria, UA: NONE SEEN
Casts: NONE SEEN /lpf
Epithelial Cells (non renal): NONE SEEN /hpf (ref 0–10)
WBC, UA: NONE SEEN /hpf (ref 0–5)

## 2020-12-11 LAB — HEMOGLOBIN A1C
Est. average glucose Bld gHb Est-mCnc: 114 mg/dL
Hgb A1c MFr Bld: 5.6 % (ref 4.8–5.6)

## 2020-12-12 ENCOUNTER — Encounter: Payer: Self-pay | Admitting: Physician Assistant

## 2020-12-12 DIAGNOSIS — E785 Hyperlipidemia, unspecified: Secondary | ICD-10-CM | POA: Insufficient documentation

## 2020-12-12 DIAGNOSIS — R748 Abnormal levels of other serum enzymes: Secondary | ICD-10-CM | POA: Insufficient documentation

## 2020-12-17 ENCOUNTER — Encounter: Payer: Self-pay | Admitting: *Deleted

## 2020-12-19 ENCOUNTER — Inpatient Hospital Stay: Payer: BC Managed Care – PPO | Attending: Oncology | Admitting: Oncology

## 2020-12-19 ENCOUNTER — Inpatient Hospital Stay: Payer: BC Managed Care – PPO

## 2020-12-19 ENCOUNTER — Encounter: Payer: Self-pay | Admitting: Oncology

## 2020-12-19 VITALS — BP 134/99 | HR 73 | Temp 98.4°F | Wt 186.6 lb

## 2020-12-19 DIAGNOSIS — R7401 Elevation of levels of liver transaminase levels: Secondary | ICD-10-CM | POA: Diagnosis not present

## 2020-12-19 DIAGNOSIS — D751 Secondary polycythemia: Secondary | ICD-10-CM

## 2020-12-19 DIAGNOSIS — R7989 Other specified abnormal findings of blood chemistry: Secondary | ICD-10-CM

## 2020-12-19 LAB — CBC WITH DIFFERENTIAL/PLATELET
Abs Immature Granulocytes: 0.03 10*3/uL (ref 0.00–0.07)
Basophils Absolute: 0 10*3/uL (ref 0.0–0.1)
Basophils Relative: 0 %
Eosinophils Absolute: 0.2 10*3/uL (ref 0.0–0.5)
Eosinophils Relative: 3 %
HCT: 50.4 % (ref 39.0–52.0)
Hemoglobin: 17.9 g/dL — ABNORMAL HIGH (ref 13.0–17.0)
Immature Granulocytes: 0 %
Lymphocytes Relative: 28 %
Lymphs Abs: 2.3 10*3/uL (ref 0.7–4.0)
MCH: 32.1 pg (ref 26.0–34.0)
MCHC: 35.5 g/dL (ref 30.0–36.0)
MCV: 90.3 fL (ref 80.0–100.0)
Monocytes Absolute: 0.8 10*3/uL (ref 0.1–1.0)
Monocytes Relative: 10 %
Neutro Abs: 4.8 10*3/uL (ref 1.7–7.7)
Neutrophils Relative %: 59 %
Platelets: 222 10*3/uL (ref 150–400)
RBC: 5.58 MIL/uL (ref 4.22–5.81)
RDW: 11.9 % (ref 11.5–15.5)
WBC: 8 10*3/uL (ref 4.0–10.5)
nRBC: 0 % (ref 0.0–0.2)

## 2020-12-19 NOTE — Progress Notes (Signed)
New patient evaluation.   

## 2020-12-19 NOTE — Progress Notes (Signed)
Hematology/Oncology Consult note [facility] Telephone:([phone] [phone]   Patient Care Team: Paulene Floor as PCP - General (Physician Assistant)  REFERRING PROVIDER: Trinna Post, PA-C  CHIEF COMPLAINTS/REASON FOR VISIT:  Evaluation of polycytosis/erythrocytosis  HISTORY OF PRESENTING ILLNESS:  Taylor Medina is a 55 y.o. male who was seen in consultation at the request of Pollak, Adriana M, PA-C for evaluation of polycytosis/erythrocytosis Reviewed patient's recent lab work which was obtain by PCP.  12/06/2020 labs showed elevated hemoglobin at 18.5, hematocrit 52.6, total white count 7, platelet counts 200 02 Chronic Onset, duration since June 2020.  Associated signs or symptoms: Denies weight loss, fever, chills, fatigue, night sweats.   Context:  Smoking history: Current everyday smoker. Testosterone supplements: Denies History of blood clots: Denies Patient has history of sleep apnea and currently not using CPAP machine.  Patient reports feeling tired and fatigued.  Patient drinks alcohol daily.  Review of Systems  Constitutional: Positive for fatigue. Negative for appetite change, chills, fever and unexpected weight change.  HENT:   Negative for hearing loss and voice change.   Eyes: Negative for eye problems and icterus.  Respiratory: Negative for chest tightness, cough and shortness of breath.   Cardiovascular: Negative for chest pain and leg swelling.  Gastrointestinal: Negative for abdominal distention and abdominal pain.  Endocrine: Negative for hot flashes.  Genitourinary: Negative for difficulty urinating, dysuria and frequency.   Musculoskeletal: Negative for arthralgias.  Skin: Negative for itching and rash.  Neurological: Negative for light-headedness and numbness.  Hematological: Negative for adenopathy. Does not bruise/bleed easily.  Psychiatric/Behavioral: Negative for confusion.    MEDICAL HISTORY:   Past Medical History:  Diagnosis Date  . Diverticulosis   . Guillain Barr syndrome ([place])   . HTN (hypertension)    episodic   . Palpitations   . Sleep apnea    CPAP  . Wears contact lenses     SURGICAL HISTORY: Past Surgical History:  Procedure Laterality Date  . COLONOSCOPY WITH PROPOFOL N/A 12/10/2017   Procedure: COLONOSCOPY WITH PROPOFOL;  Surgeon: Virgel Manifold, MD;  Location: [facility] ENDOSCOPY;  Service: Endoscopy;  Laterality: N/A;  . 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 ESOPHAGOGASTRODUODENOSCOPY (EGD) WITH PROPOFOL  12/10/2017   Procedure: ESOPHAGOGASTRODUODENOSCOPY (EGD) WITH PROPOFOL;  Surgeon: Virgel Manifold, MD;  Location: [place] [facility]OSCOPY;  Service: Endoscopy;;  . FRACTURE SURGERY  2005   Rod placed.  Tib/fib fracture  . hardware fractions     on R; several surgeries   . hernia repair surgery      SOCIAL HISTORY: Social History   Socioeconomic History  . Marital status: Married    Spouse name: Not on file  . Number of children: Not on file  . Years of education: Not on file  . Highest education level: Not on file  Occupational History  . Not on file  Tobacco Use  . Smoking status: Current Every Day Smoker    Packs/day: 0.50    Years: 6.00    Pack years: 3.00    Types: Cigarettes    Last attempt to quit: 07/28/2013    Years since quitting: 7.4  . Smokeless tobacco: Never Used  . Tobacco comment: smoked for approx 12 yrs. quit for 3. Restarted several months ago  Vaping Use  . Vaping Use: Never used  Substance and Sexual  Activity  . Alcohol use: Yes    Alcohol/week: 5.0 standard drinks    Types: 5 Glasses of wine per week  . Drug use: No  . Sexual activity: Not on file  Other Topics Concern  . Not on file  Social History Narrative   Single; full time; does not get regular  exercise.    Social Determinants of Health   Financial Resource Strain: Not on file  Food Insecurity: Not on file  Transportation Needs: Not on file  Physical Activity: Not on file  Stress: Not on file  Social Connections: Not on file  Intimate Partner Violence: Not on file    FAMILY HISTORY: Family History  Problem Relation Age of Onset  . Lung cancer Mother   . Lung cancer Father   . Cancer Other        family hx  . Coronary artery disease Other        family hx  . Diabetes Other        family hx  . Colon cancer Brother     ALLERGIES:  is allergic to influenza vaccines.  MEDICATIONS:  Current Outpatient Medications  Medication Sig Dispense Refill  . amLODipine (NORVASC) 10 MG tablet Take 1 tablet (10 mg total) by mouth daily. 90 tablet 1  . butalbital-acetaminophen-caffeine (FIORICET) 50-325-40 MG tablet Take 1-2 tablets by mouth every 6 (six) hours as needed for headache. 20 tablet 0  . lisinopril-hydrochlorothiazide (ZESTORETIC) 10-12.5 MG tablet Take 1 tablet by mouth daily. 90 tablet 1  . tadalafil (CIALIS) 20 MG tablet Take 1 tablet (20 mg total) by mouth daily as needed for erectile dysfunction. 30 tablet 6   No current facility-administered medications for this visit.     PHYSICAL EXAMINATION: ECOG PERFORMANCE STATUS: 0 - Asymptomatic Vitals:   12/19/20 1453  BP: (!) 134/99  Pulse: 73  Temp: 98.4 F (36.9 C)  SpO2: 96%   Filed Weights   12/19/20 1453  Weight: 186 lb 9 oz (84.6 kg)    Physical Exam Constitutional:      General: He is not in acute distress. HENT:     Head: Normocephalic and atraumatic.  Eyes:     General: No scleral icterus. Cardiovascular:     Rate and Rhythm: Normal rate and regular rhythm.     Heart sounds: Normal heart sounds.  Pulmonary:     Effort: Pulmonary effort is normal. No respiratory distress.     Breath sounds: No wheezing.  Abdominal:     General: Bowel sounds are normal. There is no distension.      Palpations: Abdomen is soft.  Musculoskeletal:        General: No deformity. Normal range of motion.     Cervical back: Normal range of motion and neck supple.  Skin:    General: Skin is warm and dry.     Findings: No erythema or rash.  Neurological:     Mental Status: He is alert and oriented to person, place, and time. Mental status is at baseline.     Cranial Nerves: No cranial nerve deficit.     Coordination: Coordination normal.  Psychiatric:        Mood and Affect: Mood normal.     RADIOGRAPHIC STUDIES: I have personally reviewed the radiological images as listed and agreed with the findings in the report. No results found.   LABORATORY DATA:  I have reviewed the data as listed Lab Results  Component Value Date   WBC 8.0 12/19/2020  HGB 17.9 (H) 12/19/2020   HCT 50.4 12/19/2020   MCV 90.3 12/19/2020   PLT 222 12/19/2020   Recent Labs    12/06/20 0947 12/10/20 1606  NA 136 138  K 4.7 4.6  CL 104 103  CO2 18* 18*  GLUCOSE 99 82  BUN 15 16  CREATININE 0.95 0.92  CALCIUM 9.7 9.6  GFRNONAA 92  --   GFRAA 106  --   PROT 7.0 7.1  ALBUMIN 4.2 4.4  AST 78* 51*  ALT 142* 115*  ALKPHOS 69 86  BILITOT 0.5 0.3   Iron/TIBC/Ferritin/ %Sat    Component Value Date/Time   IRON 94 12/10/2020 1606   TIBC 315 12/10/2020 1606   FERRITIN 459 (H) 12/10/2020 1606   IRONPCTSAT 30 12/10/2020 1606        ASSESSMENT & PLAN:  1. Erythrocytosis   2. Elevated ferritin    #Erythrocytosis Labs are reviewed and discussed with patient. Polycythemia (erythrocytosis) is an abnormal elevation of hemoglobin (Hgb) and/or hematocrit (Hct) in peripheral blood, and this can be caused by primary etiology, ie bone marrow mutation, or secondary etiology, ie hypoxia, smoking, androgen supplements, etc.  Patient has 2 risk factors for secondary erythrocytosis including smoking and sleep apnea.  Rule out other etiologies. I will obtain erythropoietin, carbo monoxide level, rule out primary  etiology, JAK2 with reflex to other mutations, BCR-ABL.    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 He has had negative infectious hepatitis panel. I will check hemochromatosis screening testing. Discussed with patient that elevated ferritin most likely is due to alcohol use.    Orders Placed This Encounter  Procedures  . Carbon monoxide, blood (performed at ref lab)    Standing Status:   Future    Number of Occurrences:   1    Standing Expiration Date:   12/19/2021  . BCR-ABL1, CML/ALL, PCR, QUANT    Standing Status:   Future    Number of Occurrences:   1    Standing Expiration Date:   12/19/2021  . JAK2 V617F, w Reflex to CALR/E12/MPL    Standing Status:   Future    Number of Occurrences:   1    Standing Expiration Date:   12/19/2021  . Erythropoietin    Standing Status:   Future    Number of Occurrences:   1    Standing Expiration Date:   12/19/2021  . Hemochromatosis DNA-PCR(c282y,h63d)    Standing Status:   Future    Number of Occurrences:   1    Standing Expiration Date:   12/19/2021  . CBC with Differential/Platelet    Standing Status:   Future    Number of Occurrences:   1    Standing Expiration Date:   12/19/2021    We spent sufficient time to discuss many aspect of care, questions were answered to patient's satisfaction. The patient knows to call the clinic with any problems questions or concerns.  Cc Trinna Post, PA-C  Return of visit: 2 weeks Thank you for this kind referral and the opportunity to participate in the care of this patient. A copy of today's note is routed to referring provider    Earlie Server, MD, PhD 12/19/2020

## 2020-12-20 LAB — CARBON MONOXIDE, BLOOD (PERFORMED AT REF LAB): Carbon Monoxide, Blood: 7.5 % — ABNORMAL HIGH (ref 0.0–3.6)

## 2020-12-20 LAB — ERYTHROPOIETIN: Erythropoietin: 3.4 m[IU]/mL (ref 2.6–18.5)

## 2020-12-26 LAB — HEMOCHROMATOSIS DNA-PCR(C282Y,H63D)

## 2020-12-27 LAB — BCR-ABL1, CML/ALL, PCR, QUANT: Interpretation (BCRAL):: NEGATIVE

## 2020-12-31 LAB — CALR + JAK2 E12-15 + MPL (REFLEXED)

## 2020-12-31 LAB — JAK2 V617F, W REFLEX TO CALR/E12/MPL

## 2021-01-03 ENCOUNTER — Other Ambulatory Visit: Payer: Self-pay

## 2021-01-03 DIAGNOSIS — D751 Secondary polycythemia: Secondary | ICD-10-CM

## 2021-01-04 ENCOUNTER — Other Ambulatory Visit: Payer: Self-pay

## 2021-01-04 ENCOUNTER — Encounter: Payer: Self-pay | Admitting: Oncology

## 2021-01-04 ENCOUNTER — Inpatient Hospital Stay: Payer: BC Managed Care – PPO

## 2021-01-04 ENCOUNTER — Inpatient Hospital Stay (HOSPITAL_BASED_OUTPATIENT_CLINIC_OR_DEPARTMENT_OTHER): Payer: BC Managed Care – PPO | Admitting: Oncology

## 2021-01-04 ENCOUNTER — Other Ambulatory Visit: Payer: Self-pay | Admitting: Oncology

## 2021-01-04 VITALS — BP 117/80 | HR 75 | Temp 98.5°F | Resp 16 | Wt 187.1 lb

## 2021-01-04 VITALS — BP 103/75 | HR 79 | Resp 16

## 2021-01-04 DIAGNOSIS — R7989 Other specified abnormal findings of blood chemistry: Secondary | ICD-10-CM

## 2021-01-04 DIAGNOSIS — D751 Secondary polycythemia: Secondary | ICD-10-CM

## 2021-01-04 DIAGNOSIS — G4730 Sleep apnea, unspecified: Secondary | ICD-10-CM | POA: Diagnosis not present

## 2021-01-04 DIAGNOSIS — Z720 Tobacco use: Secondary | ICD-10-CM | POA: Diagnosis not present

## 2021-01-04 HISTORY — DX: Secondary polycythemia: D75.1

## 2021-01-04 LAB — HEMOGLOBIN AND HEMATOCRIT, BLOOD
HCT: 52.7 % — ABNORMAL HIGH (ref 39.0–52.0)
Hemoglobin: 19 g/dL — ABNORMAL HIGH (ref 13.0–17.0)

## 2021-01-04 NOTE — Progress Notes (Signed)
Performed 500 ml phlebotomy today. Educated patient  With after care instructions. VSS. Pt feels at his baseline at time of discharge.

## 2021-01-04 NOTE — Progress Notes (Signed)
Hematology/Oncology Consult note [facility] Telephone:([phone] [phone]   Patient Care Team: Paulene Floor as PCP - General (Physician Assistant)  REFERRING PROVIDER: Trinna Post, PA-C  CHIEF COMPLAINTS/REASON FOR VISIT:  Evaluation of polycytosis/erythrocytosis  HISTORY OF PRESENTING ILLNESS:  Taylor Medina is a 55 y.o. male who was seen in consultation at the request of Pollak, Adriana M, PA-C for evaluation of polycytosis/erythrocytosis Reviewed patient's recent lab work which was obtain by PCP.  12/06/2020 labs showed elevated hemoglobin at 18.5, hematocrit 52.6, total white count 7, platelet counts 200 02 Chronic Onset, duration since June 2020.  Associated signs or symptoms: Denies weight loss, fever, chills, fatigue, night sweats.   Context:  Smoking history: Current everyday smoker. Testosterone supplements: Denies History of blood clots: Denies Patient has history of sleep apnea and currently not using CPAP machine.  Patient reports feeling tired and fatigued.  Patient drinks alcohol daily.  INTERVAL HISTORY Taylor Medina is a 55 y.o. male who has above history reviewed by me today presents for follow up visit for management of erythrocytosis Problems and complaints are listed below: Patient has had blood work since last visit.  Present to discuss results and management plan. He has not been using CPAP daily.  Review of Systems  Constitutional: Positive for fatigue. Negative for appetite change, chills, fever and unexpected weight change.  HENT:   Negative for hearing loss and voice change.   Eyes: Negative for eye problems and icterus.  Respiratory: Negative for chest tightness, cough and shortness of breath.   Cardiovascular: Negative for chest pain and leg swelling.  Gastrointestinal: Negative for abdominal distention and abdominal pain.  Endocrine: Negative for hot flashes.  Genitourinary: Negative for  difficulty urinating, dysuria and frequency.   Musculoskeletal: Negative for arthralgias.  Skin: Negative for itching and rash.  Neurological: Negative for light-headedness and numbness.  Hematological: Negative for adenopathy. Does not bruise/bleed easily.  Psychiatric/Behavioral: Negative for confusion.    MEDICAL HISTORY:  Past Medical History:  Diagnosis Date  . Diverticulosis   . Erythrocytosis 01/04/2021  . Guillain Barr syndrome ([place])   . HTN (hypertension)    episodic   . Palpitations   . Sleep apnea    CPAP  . Wears contact lenses     SURGICAL HISTORY: Past Surgical History:  Procedure Laterality Date  . COLONOSCOPY WITH PROPOFOL N/A 12/10/2017   Procedure: COLONOSCOPY WITH PROPOFOL;  Surgeon: Virgel Manifold, MD;  Location: [facility] ENDOSCOPY;  Service: Endoscopy;  Laterality: N/A;  . 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 ESOPHAGOGASTRODUODENOSCOPY (EGD) WITH PROPOFOL  12/10/2017   Procedure: ESOPHAGOGASTRODUODENOSCOPY (EGD) WITH PROPOFOL;  Surgeon: Virgel Manifold, MD;  Location: [place] [facility];  Service: Endoscopy;;  . FRACTURE SURGERY  2005   Rod placed.  Tib/fib fracture  . hardware fractions     on R; several surgeries   . hernia repair surgery      SOCIAL HISTORY: Social History   Socioeconomic History  . Marital status: Married    Spouse name: Not on file  . Number of children: Not on file  . Years of education: Not on file  . Highest education level: Not on file  Occupational History  . Not on file  Tobacco Use  . Smoking status: Current Every Day Smoker    Packs/day: 0.50    Years: 6.00  Pack years: 3.00    Types: Cigarettes    Last attempt to quit: 07/28/2013    Years since quitting: 7.4  . Smokeless tobacco: Never Used  . Tobacco comment: smoked for approx 12 yrs. quit for 3.  Restarted several months ago  Vaping Use  . Vaping Use: Never used  Substance and Sexual Activity  . Alcohol use: Yes    Alcohol/week: 5.0 standard drinks    Types: 5 Glasses of wine per week  . Drug use: No  . Sexual activity: Not on file  Other Topics Concern  . Not on file  Social History Narrative   Single; full time; does not get regular exercise.    Social Determinants of Health   Financial Resource Strain: Not on file  Food Insecurity: Not on file  Transportation Needs: Not on file  Physical Activity: Not on file  Stress: Not on file  Social Connections: Not on file  Intimate Partner Violence: Not on file    FAMILY HISTORY: Family History  Problem Relation Age of Onset  . Lung cancer Mother   . Lung cancer Father   . Cancer Other        family hx  . Coronary artery disease Other        family hx  . Diabetes Other        family hx  . Colon cancer Brother     ALLERGIES:  is allergic to influenza vaccines.  MEDICATIONS:  Current Outpatient Medications  Medication Sig Dispense Refill  . amLODipine (NORVASC) 10 MG tablet Take 1 tablet (10 mg total) by mouth daily. 90 tablet 1  . butalbital-acetaminophen-caffeine (FIORICET) 50-325-40 MG tablet Take 1-2 tablets by mouth every 6 (six) hours as needed for headache. 20 tablet 0  . lisinopril-hydrochlorothiazide (ZESTORETIC) 10-12.5 MG tablet Take 1 tablet by mouth daily. 90 tablet 1  . tadalafil (CIALIS) 20 MG tablet Take 1 tablet (20 mg total) by mouth daily as needed for erectile dysfunction. 30 tablet 6   No current facility-administered medications for this visit.     PHYSICAL EXAMINATION: ECOG PERFORMANCE STATUS: 0 - Asymptomatic Vitals:   01/04/21 1310  BP: 117/80  Pulse: 75  Resp: 16  Temp: 98.5 F (36.9 C)  SpO2: 97%   Filed Weights   01/04/21 1310  Weight: 187 lb 1.6 oz (84.9 kg)    Physical Exam Constitutional:      General: He is not in acute distress. HENT:     Head: Normocephalic and  atraumatic.  Eyes:     General: No scleral icterus. Cardiovascular:     Rate and Rhythm: Normal rate and regular rhythm.     Heart sounds: Normal heart sounds.  Pulmonary:     Effort: Pulmonary effort is normal. No respiratory distress.     Breath sounds: No wheezing.  Abdominal:     General: Bowel sounds are normal. There is no distension.     Palpations: Abdomen is soft.  Musculoskeletal:        General: No deformity. Normal range of motion.     Cervical back: Normal range of motion and neck supple.  Skin:    General: Skin is warm and dry.     Findings: No erythema or rash.  Neurological:     Mental Status: He is alert and oriented to person, place, and time. Mental status is at baseline.     Cranial Nerves: No cranial nerve deficit.     Coordination: Coordination normal.  Psychiatric:  Mood and Affect: Mood normal.     RADIOGRAPHIC STUDIES: I have personally reviewed the radiological images as listed and agreed with the findings in the report. No results found.   LABORATORY DATA:  I have reviewed the data as listed Lab Results  Component Value Date   WBC 8.0 12/19/2020   HGB 19.0 (H) 01/04/2021   HCT 52.7 (H) 01/04/2021   MCV 90.3 12/19/2020   PLT 222 12/19/2020   Recent Labs    12/06/20 0947 12/10/20 1606  NA 136 138  K 4.7 4.6  CL 104 103  CO2 18* 18*  GLUCOSE 99 82  BUN 15 16  CREATININE 0.95 0.92  CALCIUM 9.7 9.6  GFRNONAA 92  --   GFRAA 106  --   PROT 7.0 7.1  ALBUMIN 4.2 4.4  AST 78* 51*  ALT 142* 115*  ALKPHOS 69 86  BILITOT 0.5 0.3   Iron/TIBC/Ferritin/ %Sat    Component Value Date/Time   IRON 94 12/10/2020 1606   TIBC 315 12/10/2020 1606   FERRITIN 459 (H) 12/10/2020 1606   IRONPCTSAT 30 12/10/2020 1606        ASSESSMENT & PLAN:  1. Secondary erythrocytosis   2. Elevated ferritin   3. Tobacco use   4. Sleep apnea, unspecified type    #Erythrocytosis BCR-ABLNegative, JAK2 V617F mutation negative, with reflex to other  mutations CALR, MPL, JAK 2 Ex 12-15 mutations negative. Increased, monocyte level. This is most likely secondary erythrocytosis due to smoking as well as sleep apnea. Smoke cessation was discussed in details with patient. Also recommend patient to use CPAP every night. Phlebotomy if hematocrit is above 50. Proceed with phlebotomy 500 cc x 1 today.  Repeat another phlebotomy 500 cc in 1 week.   H&H +/- phlebotomy in 5 weeks in 9 weeks.  Follow-up with me at MD in 13 weeks.   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 Negative hemochromatosis mutation.   Orders Placed This Encounter  Procedures  . Hemoglobin and hematocrit, blood    Standing Status:   Standing    Number of Occurrences:   2    Standing Expiration Date:   01/04/2022  . CBC with Differential/Platelet    Standing Status:   Future    Standing Expiration Date:   01/04/2022    We spent sufficient time to discuss many aspect of care, questions were answered to patient's satisfaction. The patient knows to call the clinic with any problems questions or concerns.  Cc Trinna Post, [place]   Earlie Server, MD, PhD 01/04/2021

## 2021-01-04 NOTE — Patient Instructions (Signed)

## 2021-01-11 ENCOUNTER — Inpatient Hospital Stay: Payer: BC Managed Care – PPO | Attending: Oncology

## 2021-01-11 DIAGNOSIS — D751 Secondary polycythemia: Secondary | ICD-10-CM | POA: Insufficient documentation

## 2021-01-28 ENCOUNTER — Ambulatory Visit: Payer: Self-pay | Admitting: *Deleted

## 2021-01-28 ENCOUNTER — Other Ambulatory Visit: Payer: Self-pay

## 2021-01-28 ENCOUNTER — Emergency Department: Payer: BC Managed Care – PPO

## 2021-01-28 ENCOUNTER — Emergency Department
Admission: EM | Admit: 2021-01-28 | Discharge: 2021-01-28 | Disposition: A | Discharge status: Home / Self Care | Payer: BC Managed Care – PPO | Attending: Emergency Medicine | Admitting: Emergency Medicine

## 2021-01-28 ENCOUNTER — Encounter: Payer: Self-pay | Admitting: Intensive Care

## 2021-01-28 DIAGNOSIS — Z79899 Other long term (current) drug therapy: Secondary | ICD-10-CM | POA: Insufficient documentation

## 2021-01-28 DIAGNOSIS — R002 Palpitations: Secondary | ICD-10-CM | POA: Diagnosis present

## 2021-01-28 DIAGNOSIS — F17210 Nicotine dependence, cigarettes, uncomplicated: Secondary | ICD-10-CM | POA: Diagnosis not present

## 2021-01-28 DIAGNOSIS — R0789 Other chest pain: Secondary | ICD-10-CM | POA: Diagnosis not present

## 2021-01-28 DIAGNOSIS — Z85038 Personal history of other malignant neoplasm of large intestine: Secondary | ICD-10-CM | POA: Diagnosis not present

## 2021-01-28 DIAGNOSIS — I10 Essential (primary) hypertension: Secondary | ICD-10-CM | POA: Diagnosis not present

## 2021-01-28 HISTORY — DX: Pure hypercholesterolemia, unspecified: E78.00

## 2021-01-28 LAB — CBC
HCT: 50.6 % (ref 39.0–52.0)
Hemoglobin: 17.9 g/dL — ABNORMAL HIGH (ref 13.0–17.0)
MCH: 32.8 pg (ref 26.0–34.0)
MCHC: 35.4 g/dL (ref 30.0–36.0)
MCV: 92.8 fL (ref 80.0–100.0)
Platelets: 176 10*3/uL (ref 150–400)
RBC: 5.45 MIL/uL (ref 4.22–5.81)
RDW: 13.2 % (ref 11.5–15.5)
WBC: 6.9 10*3/uL (ref 4.0–10.5)
nRBC: 0 % (ref 0.0–0.2)

## 2021-01-28 LAB — TROPONIN I (HIGH SENSITIVITY)
Troponin I (High Sensitivity): 4 ng/L (ref <18)
Troponin I (High Sensitivity): 7 ng/L (ref <18)

## 2021-01-28 LAB — BASIC METABOLIC PANEL
Anion gap: 8 (ref 5–15)
BUN: 20 mg/dL (ref 6–20)
CO2: 22 mmol/L (ref 22–32)
Calcium: 9.1 mg/dL (ref 8.9–10.3)
Chloride: 104 mmol/L (ref 98–111)
Creatinine, Ser: 1.09 mg/dL (ref 0.61–1.24)
GFR, Estimated: >60 mL/min (ref >60)
Glucose, Bld: 126 mg/dL — ABNORMAL HIGH (ref 70–99)
Potassium: 4.1 mmol/L (ref 3.5–5.1)
Sodium: 134 mmol/L — ABNORMAL LOW (ref 135–145)

## 2021-01-28 NOTE — ED Triage Notes (Signed)
Patient c/o chest palpitations and chest pressure that started last night around 10pm.

## 2021-01-28 NOTE — Telephone Encounter (Signed)
Patient calls with episodes of Afib and elevated HR last night, noticed on apple watch. Noticed irregular heartbeat at the time. Resting at time of episodes. No history of cardiac events. Referred to the ED for evaluation at this time.

## 2021-01-28 NOTE — Telephone Encounter (Signed)
Afib and rapid heart rate over 150 last night-several episodes. He noticed fluttering and skipped beats along with fast heart rate.No history of cardiac problems. No unusual exercise/activity before episodes. He was resting at the time.Advised ED for cardiac evaluation. Reason for Disposition . [1] Heart beating very rapidly (e.g., > 140 / minute) AND [2] not present now  (Exception: during exercise)  Answer Assessment - Initial Assessment Questions 1. DESCRIPTION: "Please describe your heart rate or heartbeat that you are having" (e.g., fast/slow, regular/irregular, skipped or extra beats, "palpitations")     Fast and irregular, skipping beats 2. ONSET: "When did it start?" (Minutes, hours or days)      Several episodes last night 3. DURATION: "How long does it last" (e.g., seconds, minutes, hours)     brief 4. PATTERN "Does it come and go, or has it been constant since it started?"  "Does it get worse with exertion?"   "Are you feeling it now?"   Only several episodes last night. None today 5. TAP: "Using your hand, can you tap out what you are feeling on a chair or table in front of you, so that I can hear?" (Note: not all patients can do this)       NA 6. HEART RATE: "Can you tell me your heart rate?" "How many beats in 15 seconds?"  (Note: not all patients can do this)       HR 81 7. RECURRENT SYMPTOM: "Have you ever had this before?" If Yes, ask: "When was the last time?" and "What happened that time?"      No 8. CAUSE: "What do you think is causing the palpitations?"     Unsure 9. CARDIAC HISTORY: "Do you have any history of heart disease?" (e.g., heart attack, angina, bypass surgery, angioplasty, arrhythmia)      no 10. OTHER SYMPTOMS: "Do you have any other symptoms?" (e.g., dizziness, chest pain, sweating, difficulty breathing)       no 11. PREGNANCY: "Is there any chance you are pregnant?" "When was your last menstrual period?"       na  Protocols used: [place]

## 2021-01-28 NOTE — ED Provider Notes (Signed)
[facility] Emergency Department Provider Note ____________________________________________   Event Date/Time   First MD Initiated Contact with Patient 01/28/21 1200     (approximate)  I have reviewed the triage vital signs and the nursing notes.  HISTORY  Chief Complaint Chest Pain   HPI Taylor Medina is a 55 y.o. malewho presents to the ED for evaluation of chest pain and palpitations.   Chart review indicates hx HTN, HLD and heart palpitations. Erythrocytosis evaluated by oncology.  Likely secondary with her cytosis due to his smoking history and sleep apnea.  Phlebotomy if hematocrit above 50.  Patient presents to the ED with intermittent palpitations and chest pressure that started last night about 10 PM while laying in bed.  Patient reports explicit concern for atrial fibrillation, due to his apple watch telling him that he was in rapid A. fib with rates in the 120s-130s.  I reviewed this information on his iPhone that he has brought with him, demonstrating about a half dozen episodes of A. fib, reportedly through the phone.  He reports concomitant nausea without emesis or syncope.  Patient reports feeling a little bit tired and generalized fashion right now, but denies additional acute complaints.  Patient reports occasionally using his CPAP and he continues to smoke about a half PPD.   Past Medical History:  Diagnosis Date  . Diverticulosis   . Erythrocytosis 01/04/2021  . Guillain Barr syndrome ([place])   . High cholesterol   . HTN (hypertension)    episodic   . Palpitations   . Sleep apnea    CPAP  . Wears contact lenses     Patient Active Problem List   Diagnosis Date Noted  . Erythrocytosis 01/04/2021  . Elevated ferritin 01/04/2021  . Tobacco use 01/04/2021  . Hyperlipidemia 12/12/2020  . Elevated liver enzymes 12/12/2020  . Esophageal dysphagia   . Thickening of esophagus   . Reflux esophagitis   . Stomach irritation   . FH:  colon cancer   . Special screening for malignant neoplasms, colon   . Benign neoplasm of ascending colon   . External hemorrhoids   . Benign neoplasm of descending colon   . Sleep apnea 10/24/2016  . Hypertriglyceridemia 12/24/2009  . HYPERTENSION, BENIGN 12/24/2009    Past Surgical History:  Procedure Laterality Date  . COLONOSCOPY WITH PROPOFOL N/A 12/10/2017   Procedure: COLONOSCOPY WITH PROPOFOL;  Surgeon: Virgel Manifold, MD;  Location: [facility] ENDOSCOPY;  Service: Endoscopy;  Laterality: N/A;  . 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 ESOPHAGOGASTRODUODENOSCOPY (EGD) WITH PROPOFOL  12/10/2017   Procedure: ESOPHAGOGASTRODUODENOSCOPY (EGD) WITH PROPOFOL;  Surgeon: Virgel Manifold, MD;  Location: [place] [facility];  Service: Endoscopy;;  . FRACTURE SURGERY  2005   Rod placed.  Tib/fib fracture  . hardware fractions     on R; several surgeries   . hernia repair surgery      Prior to Admission medications   Medication Sig Start Date End Date Taking? Authorizing Provider  amLODipine (NORVASC) 10 MG tablet Take 1 tablet (10 mg total) by mouth daily. 12/10/20   Trinna Post, PA-C  butalbital-acetaminophen-caffeine (FIORICET) [phone] MG tablet Take 1-2 tablets by mouth every 6 (six) hours as needed for headache. 12/10/20 12/10/21  Trinna Post, PA-C  lisinopril-hydrochlorothiazide (ZESTORETIC) 10-12.5 MG tablet Take 1 tablet by mouth daily. 12/10/20   Terrilee Croak,  Wendee Beavers, PA-C  tadalafil (CIALIS) 20 MG tablet Take 1 tablet (20 mg total) by mouth daily as needed for erectile dysfunction. 08/29/20   Zara Council A, PA-C  sildenafil (VIAGRA) 100 MG tablet Take 1 tablet (100 mg total) by mouth daily as needed for erectile dysfunction. Take two hours prior to intercourse on an empty stomach 07/28/16 07/28/16  Zara Council A,  PA-C    Allergies Influenza vaccines  Family History  Problem Relation Age of Onset  . Lung cancer Mother   . Lung cancer Father   . Cancer Other        family hx  . Coronary artery disease Other        family hx  . Diabetes Other        family hx  . Colon cancer Brother     Social History Social History   Tobacco Use  . Smoking status: Current Every Day Smoker    Packs/day: 0.50    Years: 6.00    Pack years: 3.00    Types: Cigarettes    Last attempt to quit: 07/28/2013    Years since quitting: 7.5  . Smokeless tobacco: Never Used  . Tobacco comment: smoked for approx 12 yrs. quit for 3. Restarted several months ago  Vaping Use  . Vaping Use: Never used  Substance Use Topics  . Alcohol use: Yes    Alcohol/week: 5.0 standard drinks    Types: 5 Glasses of wine per week  . Drug use: No    Review of Systems  Constitutional: No fever/chills.  Positive generalized weakness Eyes: No visual changes. ENT: No sore throat. Cardiovascular: Positive chest pain palpitations Respiratory: Denies shortness of breath. Gastrointestinal: No abdominal pain.  No nausea, no vomiting.  No diarrhea.  No constipation. Genitourinary: Negative for dysuria. Musculoskeletal: Negative for back pain. Skin: Negative for rash. Neurological: Negative for headaches, focal weakness or numbness.  ____________________________________________   PHYSICAL EXAM:  VITAL SIGNS: Vitals:   01/28/21 1200 01/28/21 1430  BP: 128/81 102/73  Pulse: 88 72  Resp: 18 16  Temp:    SpO2: 92% 92%    Constitutional: Alert and oriented. Well appearing and in no acute distress. Eyes: Conjunctivae are normal. PERRL. EOMI. Head: Atraumatic. Nose: No congestion/rhinnorhea. Mouth/Throat: Mucous membranes are moist.  Oropharynx non-erythematous. Neck: No stridor. No cervical spine tenderness to palpation. Cardiovascular: Normal rate, regular rhythm. Grossly normal heart sounds.  Good peripheral  circulation. Respiratory: Normal respiratory effort.  No retractions. Lungs CTAB. Gastrointestinal: Soft , nondistended, nontender to palpation. No CVA tenderness. Musculoskeletal: No lower extremity tenderness nor edema.  No joint effusions. No signs of acute trauma. Neurologic:  Normal speech and language. No gross focal neurologic deficits are appreciated. No gait instability noted. Skin:  Skin is warm, dry and intact. No rash noted. Psychiatric: Mood and affect are normal. Speech and behavior are normal. ____________________________________________   LABS (all labs ordered are listed, but only abnormal results are displayed)  Labs Reviewed  BASIC METABOLIC PANEL - Abnormal; Notable for the following components:      Result Value   Sodium 134 (*)    Glucose, Bld 126 (*)    All other components within normal limits  CBC - Abnormal; Notable for the following components:   Hemoglobin 17.9 (*)    All other components within normal limits  TROPONIN I (HIGH SENSITIVITY)  TROPONIN I (HIGH SENSITIVITY)   ____________________________________________  12 Lead EKG  Sinus rhythm, rate of 88 bpm.  Normal axis  and intervals.  No evidence of acute ischemia.  Signs of biatrial enlargement. ____________________________________________  RADIOLOGY  ED MD interpretation: 2 view CXR reviewed by me without evidence of acute cardiopulmonary pathology.  Official radiology report(s): DG Chest 2 View  Result Date: 01/28/2021 CLINICAL DATA:  Chest palpitations and pressure. EXAM: CHEST - 2 VIEW COMPARISON:  PA and lateral chest 10/29/2015. FINDINGS: The lungs are clear. Heart size is normal. No pneumothorax or pleural fluid. No acute or focal bony abnormality. IMPRESSION: Negative chest. Electronically Signed   By: Inge Rise M.D.   On: 01/28/2021 12:21    ____________________________________________   PROCEDURES and INTERVENTIONS  Procedure(s) performed (including Critical Care):  .1-3  Lead EKG Interpretation Performed by: Vladimir Crofts, MD Authorized by: Vladimir Crofts, MD     Interpretation: normal     ECG rate:  80   ECG rate assessment: normal     Rhythm: sinus rhythm     Ectopy: none     Conduction: normal      Medications - No data to display  ____________________________________________   MDM / ED COURSE   55 year old male with known thrombocytosis but no A. fib presents to the ED with intermittent chest pain and palpitations, with concern for atrial fibrillation, but only demonstrating a sinus rhythm here in the ED, and amenable to Holter monitor attachment and outpatient management.  Normal vitals on room air.  Exam without evidence of acute derangements.  Looks well without distress, neurologic or vascular deficits.  No signs of A. fib here, but he does show me his iPhone with multiple rapid irregular rhythms from his symptoms overnight via his apple watch.  No signs of ACS and patient remains asymptomatic here.  EKG is nonischemic and a sinus rhythm and troponins are negative x2.  Per recommendations documented on recent heme/onc note, performed therapeutic phlebotomy of 500 cc of whole blood due to his hematocrit greater than 50.  Further discussed the case with cardiology, who agrees to see the patient immediately after ED discharge in the clinic to attach a Holter monitor.  We discussed return precautions for the ED prior to discharge.  Clinical Course as of 01/28/21 1831  Mon Jan 28, 2021  1244 I spoke with the nurse from cardiopulmonary unit who recommend I discussed with on-call cardiologist to have a Holter monitor attached/ordered. [DS]  0388 I discussed the patient therapeutic phlebotomy per recommendations from hematology/oncology.  He is agreeable. [DS]  8280  I speak with Dr. Ubaldo Glassing ,2pm or later. 2nd floor [facility]. They can attach holter monitor there [DS]  [place] pool here to initiate phlebotomy from the patient. [DS]  0349  Reassessed.  Second troponin negative.  Patient continues to feel well.  We discussed going over to cardiology clinic to get hooked up with a Holter monitor.  We discussed return precautions for the ED [DS]    Clinical Course User Index [DS] Vladimir Crofts, MD    ____________________________________________   FINAL CLINICAL IMPRESSION(S) / ED DIAGNOSES  Final diagnoses:  Other chest pain  Palpitations     ED Discharge Orders    None       Peytyn Trine Tamala Julian   Note:  This document was prepared using Dragon voice recognition software and may include unintentional dictation errors.   Vladimir Crofts, MD 01/28/21 [phone]

## 2021-01-28 NOTE — ED Notes (Signed)
500 ml drained at this time. Patient tolerated well.

## 2021-01-28 NOTE — Discharge Instructions (Signed)
As we discussed, please go directly over to the cardiology clinic at [facility].  It is on the second floor of [facility].  They will hook you up with a Holter monitor to wear for a few days to evaluate for atrial fibrillation.  Please wear your CPAP and consider quitting smoking altogether. Lack of CPAP and continued smoking can certainly precipitate atrial fibrillation and make her symptoms worse.  If you develop any symptoms that do not go away, passing out with your symptoms, please return to the ED.

## 2021-02-08 ENCOUNTER — Inpatient Hospital Stay: Payer: BC Managed Care – PPO

## 2021-02-08 DIAGNOSIS — D751 Secondary polycythemia: Secondary | ICD-10-CM | POA: Diagnosis not present

## 2021-02-08 LAB — HEMOGLOBIN AND HEMATOCRIT, BLOOD
HCT: 49.7 % (ref 39.0–52.0)
Hemoglobin: 17.7 g/dL — ABNORMAL HIGH (ref 13.0–17.0)

## 2021-02-08 NOTE — Progress Notes (Signed)
Patient here for phlebotomy today,  not given. HCT 49.7 today, per order phlebotomy only  If > 50. Patient asymptomatic and states he had a phlebotomy in ED on 4/18. No concerns voiced. Patient discharged, stable.

## 2021-03-07 ENCOUNTER — Other Ambulatory Visit: Payer: Self-pay | Admitting: *Deleted

## 2021-03-07 ENCOUNTER — Inpatient Hospital Stay: Payer: BC Managed Care – PPO | Attending: Oncology

## 2021-03-07 ENCOUNTER — Inpatient Hospital Stay: Payer: BC Managed Care – PPO

## 2021-03-07 DIAGNOSIS — D751 Secondary polycythemia: Secondary | ICD-10-CM

## 2021-03-07 LAB — HEMOGLOBIN AND HEMATOCRIT, BLOOD
HCT: 51.5 % (ref 39.0–52.0)
Hemoglobin: 18.4 g/dL — ABNORMAL HIGH (ref 13.0–17.0)

## 2021-03-07 NOTE — Patient Instructions (Signed)

## 2021-03-07 NOTE — Progress Notes (Signed)
HCT 51.5 Performed 500 ml phlebotomy. Pt tolerated procedure well. VSS. Pt discharged from clinic feeling well.

## 2021-04-05 ENCOUNTER — Inpatient Hospital Stay (HOSPITAL_BASED_OUTPATIENT_CLINIC_OR_DEPARTMENT_OTHER): Payer: BC Managed Care – PPO | Admitting: Oncology

## 2021-04-05 ENCOUNTER — Inpatient Hospital Stay: Payer: BC Managed Care – PPO

## 2021-04-05 ENCOUNTER — Other Ambulatory Visit: Payer: Self-pay

## 2021-04-05 ENCOUNTER — Inpatient Hospital Stay: Payer: BC Managed Care – PPO | Attending: Oncology

## 2021-04-05 VITALS — BP 104/73 | HR 78

## 2021-04-05 VITALS — BP 123/82 | HR 76 | Temp 98.0°F | Resp 18 | Wt 177.7 lb

## 2021-04-05 DIAGNOSIS — D751 Secondary polycythemia: Secondary | ICD-10-CM | POA: Diagnosis not present

## 2021-04-05 DIAGNOSIS — I10 Essential (primary) hypertension: Secondary | ICD-10-CM | POA: Insufficient documentation

## 2021-04-05 DIAGNOSIS — G4730 Sleep apnea, unspecified: Secondary | ICD-10-CM | POA: Diagnosis not present

## 2021-04-05 DIAGNOSIS — Z79899 Other long term (current) drug therapy: Secondary | ICD-10-CM | POA: Insufficient documentation

## 2021-04-05 DIAGNOSIS — E7800 Pure hypercholesterolemia, unspecified: Secondary | ICD-10-CM | POA: Insufficient documentation

## 2021-04-05 DIAGNOSIS — F17210 Nicotine dependence, cigarettes, uncomplicated: Secondary | ICD-10-CM | POA: Diagnosis not present

## 2021-04-05 LAB — CBC WITH DIFFERENTIAL/PLATELET
Abs Immature Granulocytes: 0.02 10*3/uL (ref 0.00–0.07)
Basophils Absolute: 0 10*3/uL (ref 0.0–0.1)
Basophils Relative: 0 %
Eosinophils Absolute: 0.2 10*3/uL (ref 0.0–0.5)
Eosinophils Relative: 3 %
HCT: 50.2 % (ref 39.0–52.0)
Hemoglobin: 17.9 g/dL — ABNORMAL HIGH (ref 13.0–17.0)
Immature Granulocytes: 0 %
Lymphocytes Relative: 30 %
Lymphs Abs: 2.2 10*3/uL (ref 0.7–4.0)
MCH: 31.9 pg (ref 26.0–34.0)
MCHC: 35.7 g/dL (ref 30.0–36.0)
MCV: 89.5 fL (ref 80.0–100.0)
Monocytes Absolute: 0.7 10*3/uL (ref 0.1–1.0)
Monocytes Relative: 9 %
Neutro Abs: 4.3 10*3/uL (ref 1.7–7.7)
Neutrophils Relative %: 58 %
Platelets: 246 10*3/uL (ref 150–400)
RBC: 5.61 MIL/uL (ref 4.22–5.81)
RDW: 12.1 % (ref 11.5–15.5)
WBC: 7.3 10*3/uL (ref 4.0–10.5)
nRBC: 0 % (ref 0.0–0.2)

## 2021-04-05 MED ORDER — SODIUM CHLORIDE 0.9 % IV SOLN
Freq: Once | INTRAVENOUS | Status: AC
Start: 2021-04-05 — End: 2021-04-05
  Filled 2021-04-05: qty 250

## 2021-04-05 NOTE — Progress Notes (Signed)
Therapeutic phlebotomy performed in [facility] using 20g angiocath. 568ml removed. Pt tolerated procedure well, but voiced feeling slightly "light headed" post-procedure. Vital signs stable. Pt requesting IV fluids. PRN NS released and administered.

## 2021-04-05 NOTE — Progress Notes (Signed)
Hematology/Oncology Consult note [facility] Telephone:([phone] [phone]   Patient Care Team: [place] as PCP - General  REFERRING PROVIDER: Trinna Post, PA-C  CHIEF COMPLAINTS/REASON FOR VISIT:  Evaluation of polycytosis/erythrocytosis  HISTORY OF PRESENTING ILLNESS:  Taylor Medina is a 55 y.o. male who was seen in consultation at the request of Pollak, Adriana M, PA-C for evaluation of polycytosis/erythrocytosis Reviewed patient's recent lab work which was obtain by PCP.  12/06/2020 labs showed elevated hemoglobin at 18.5, hematocrit 52.6, total white count 7, platelet counts 200 02 Chronic Onset, duration since June 2020.  Associated signs or symptoms: Denies weight loss, fever, chills, fatigue, night sweats.   Context:  Smoking history: Current everyday smoker. Testosterone supplements: Denies History of blood clots: Denies Patient has history of sleep apnea and currently not using CPAP machine.  Patient reports feeling tired and fatigued.  Patient drinks alcohol daily.  INTERVAL HISTORY Taylor Medina is a 55 y.o. male who presents today for follow up visit for management of erythrocytosis.  Reports no new symptoms.  He has now received 3 500 mL phlebotomies.  He denies any hyperviscosity symptoms.  Occasionally has a headache that is self-limiting.  Review of Systems  Constitutional:  Positive for fatigue. Negative for appetite change, chills, fever and unexpected weight change.  HENT:   Negative for hearing loss and voice change.   Eyes:  Negative for eye problems and icterus.  Respiratory:  Negative for chest tightness, cough and shortness of breath.   Cardiovascular:  Negative for chest pain and leg swelling.  Gastrointestinal:  Negative for abdominal distention and abdominal pain.  Endocrine: Negative for hot flashes.  Genitourinary:  Negative for difficulty urinating, dysuria and frequency.    Musculoskeletal:  Negative for arthralgias.  Skin:  Negative for itching and rash.  Neurological:  Positive for headaches. Negative for light-headedness and numbness.  Hematological:  Negative for adenopathy. Does not bruise/bleed easily.  Psychiatric/Behavioral:  Negative for confusion.    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  Tib/fib fracture   hardware fractions     on R; several surgeries    hernia repair surgery      SOCIAL HISTORY: Social History   Socioeconomic History   Marital status: Married    Spouse name: Not on file   Number of children: Not on file   Years of education: Not on file   Highest education level: Not on file  Occupational History   Not on file  Tobacco Use   Smoking status: Every Day  Packs/day: 0.50    Years: 6.00    Pack years: 3.00    Types: Cigarettes    Last attempt to quit: 07/28/2013    Years since quitting: 7.6   Smokeless tobacco: Never   Tobacco comments:    smoked for approx 12 yrs. quit for 3. Restarted several months ago   Vaping Use   Vaping Use: Never used  Substance and Sexual Activity   Alcohol use: Yes    Alcohol/week: 5.0 standard drinks    Types: 5 Glasses of wine per week   Drug use: No   Sexual activity: Not on file  Other Topics Concern   Not on file  Social History Narrative   Single; full time; does not get regular exercise.    Social Determinants of Health   Financial Resource Strain: Not on file  Food Insecurity: Not on file  Transportation Needs: Not on file  Physical Activity: Not on file  Stress: Not on file  Social Connections: Not on file  Intimate Partner Violence: Not on file    FAMILY HISTORY: Family History  Problem Relation Age of Onset   Lung cancer Mother    Lung cancer Father    Cancer Other        family hx   Coronary artery disease Other        family hx   Diabetes Other        family hx   Colon cancer Brother     ALLERGIES:  is allergic to influenza vaccines.  MEDICATIONS:  Current Outpatient Medications  Medication Sig Dispense Refill   amLODipine (NORVASC) 10 MG tablet Take 1 tablet (10 mg total) by mouth daily. 90 tablet 1   butalbital-acetaminophen-caffeine (FIORICET) 50-325-40 MG tablet Take 1-2 tablets by mouth every 6 (six) hours as needed for headache. 20 tablet 0   chlorhexidine (PERIDEX) 0.12 % solution 2 (two) times daily.     DENTA 5000 PLUS 1.1 % CREA dental cream Take by mouth at bedtime.     ibuprofen (ADVIL) 800 MG tablet Take 800 mg by mouth every 8 (eight) hours as needed.     lisinopril-hydrochlorothiazide (ZESTORETIC) 10-12.5 MG tablet Take 1 tablet by mouth daily. 90 tablet 1   tadalafil (CIALIS) 20 MG tablet Take 1 tablet (20 mg total) by mouth daily as needed for erectile dysfunction. 30 tablet 6   Acetaminophen-Codeine 300-30 MG tablet Take 1 tablet by mouth every 4 (four) hours as needed. (Patient not taking: Reported on 04/05/2021)     amoxicillin (AMOXIL) 500 MG capsule Take by mouth. (Patient not taking: Reported on 04/05/2021)      No current facility-administered medications for this visit.     PHYSICAL EXAMINATION: ECOG PERFORMANCE STATUS: 0 - Asymptomatic Vitals:   04/05/21 1414  BP: 123/82  Pulse: 76  Resp: 18  Temp: 98 F (36.7 C)  SpO2: 96%   Filed Weights   04/05/21 1414  Weight: 177 lb 11.2 oz (80.6 kg)    Physical Exam Constitutional:      General: He is not in acute distress. HENT:     Head: Normocephalic and atraumatic.  Eyes:     General: No scleral icterus. Cardiovascular:     Rate and Rhythm: Normal rate and regular rhythm.     Heart sounds: Normal heart sounds.  Pulmonary:     Effort: Pulmonary effort is normal. No respiratory distress.     Breath sounds: No wheezing.  Abdominal:     General: Bowel sounds are  normal. There is no distension.     Palpations: Abdomen is soft.  Musculoskeletal:        General: No deformity. Normal range of motion.     Cervical back: Normal range of motion and neck supple.  Skin:    General: Skin is warm and dry.     Findings: No erythema or rash.  Neurological:     Mental Status: He is alert and oriented to person, place, and time. Mental status is at baseline.     Cranial Nerves: No cranial nerve deficit.     Coordination: Coordination normal.  Psychiatric:        Mood and Affect: Mood normal.    RADIOGRAPHIC STUDIES: I have personally reviewed the radiological images as listed and agreed with the findings in the report. No results found.   LABORATORY DATA:  I have reviewed the data as listed Lab Results  Component Value Date   WBC 7.3 04/05/2021   HGB 17.9 (H) 04/05/2021   HCT 50.2 04/05/2021   MCV 89.5 04/05/2021   PLT 246 04/05/2021   Recent Labs    12/06/20 0947 12/10/20 1606 01/28/21 1214  NA 136 138 134*  K 4.7 4.6 4.1  CL 104 103 104  CO2 18* 18* 22  GLUCOSE 99 82 126*  BUN 15 16 20   CREATININE 0.95 0.92 1.09  CALCIUM 9.7 9.6 9.1  GFRNONAA 92  --  >60  GFRAA 106  --   --   PROT 7.0 7.1  --   ALBUMIN 4.2 4.4   --   AST 78* 51*  --   ALT 142* 115*  --   ALKPHOS 69 86  --   BILITOT 0.5 0.3  --     Iron/TIBC/Ferritin/ %Sat    Component Value Date/Time   IRON 94 12/10/2020 1606   TIBC 315 12/10/2020 1606   FERRITIN 459 (H) 12/10/2020 1606   IRONPCTSAT 30 12/10/2020 1606        ASSESSMENT & PLAN:  No diagnosis found.  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 Increased, monocyte level. This is most likely secondary erythrocytosis due to smoking as well as sleep apnea. Smoke cessation was discussed in details with patient. Also recommend patient to use CPAP every night. Phlebotomy if hematocrit is above 50. Labs from 04/05/2021 show a hemoglobin 17.9 and hematocrit 50.2. Proceed with phlebotomy 500 cc x 1 today.   Patient expresses some concern about the expense of his phlebotomy.   Recommend he look into blood donation.  He will let us know if he decides to donate blood in the future.  Disposition Return to clinic monthly for H&H and possible phlebotomy Return to clinic in 3 months with labs, MD assessment and possible phlebotomy.  Greater than 50% was spent in counseling and coordination of care with this patient including but not limited to discussion of the relevant topics above (See A&P) including, but not limited to diagnosis and management of acute and chronic medical conditions.    No orders of the defined types were placed in this encounter.   We spent sufficient time to discuss many aspect of care, questions were answered to patient's satisfaction. The patient knows to call the clinic with any problems questions or concerns.  Cc Trinna Post, PA-C  Faythe Casa, NP 04/05/2021 2:41 PM

## 2021-04-05 NOTE — Patient Instructions (Signed)

## 2021-05-08 ENCOUNTER — Other Ambulatory Visit: Payer: Self-pay

## 2021-05-08 DIAGNOSIS — D751 Secondary polycythemia: Secondary | ICD-10-CM

## 2021-05-09 ENCOUNTER — Inpatient Hospital Stay: Payer: BC Managed Care – PPO

## 2021-05-09 ENCOUNTER — Inpatient Hospital Stay: Payer: BC Managed Care – PPO | Attending: Oncology

## 2021-05-09 VITALS — BP 112/82 | HR 67 | Temp 98.0°F | Resp 18

## 2021-05-09 DIAGNOSIS — D751 Secondary polycythemia: Secondary | ICD-10-CM | POA: Diagnosis not present

## 2021-05-09 LAB — COMPREHENSIVE METABOLIC PANEL
ALT: 73 U/L — ABNORMAL HIGH (ref 0–44)
AST: 43 U/L — ABNORMAL HIGH (ref 15–41)
Albumin: 4.2 g/dL (ref 3.5–5.0)
Alkaline Phosphatase: 60 U/L (ref 38–126)
Anion gap: 9 (ref 5–15)
BUN: 21 mg/dL — ABNORMAL HIGH (ref 6–20)
CO2: 22 mmol/L (ref 22–32)
Calcium: 9.4 mg/dL (ref 8.9–10.3)
Chloride: 102 mmol/L (ref 98–111)
Creatinine, Ser: 1.15 mg/dL (ref 0.61–1.24)
GFR, Estimated: >60 mL/min (ref >60)
Glucose, Bld: 109 mg/dL — ABNORMAL HIGH (ref 70–99)
Potassium: 4.3 mmol/L (ref 3.5–5.1)
Sodium: 133 mmol/L — ABNORMAL LOW (ref 135–145)
Total Bilirubin: 0.5 mg/dL (ref 0.3–1.2)
Total Protein: 7.3 g/dL (ref 6.5–8.1)

## 2021-05-09 LAB — CBC WITH DIFFERENTIAL/PLATELET
Abs Immature Granulocytes: 0.02 10*3/uL (ref 0.00–0.07)
Basophils Absolute: 0 10*3/uL (ref 0.0–0.1)
Basophils Relative: 0 %
Eosinophils Absolute: 0.2 10*3/uL (ref 0.0–0.5)
Eosinophils Relative: 2 %
HCT: 50.6 % (ref 39.0–52.0)
Hemoglobin: 17.6 g/dL — ABNORMAL HIGH (ref 13.0–17.0)
Immature Granulocytes: 0 %
Lymphocytes Relative: 23 %
Lymphs Abs: 1.9 10*3/uL (ref 0.7–4.0)
MCH: 31.4 pg (ref 26.0–34.0)
MCHC: 34.8 g/dL (ref 30.0–36.0)
MCV: 90.4 fL (ref 80.0–100.0)
Monocytes Absolute: 0.7 10*3/uL (ref 0.1–1.0)
Monocytes Relative: 9 %
Neutro Abs: 5.2 10*3/uL (ref 1.7–7.7)
Neutrophils Relative %: 66 %
Platelets: 218 10*3/uL (ref 150–400)
RBC: 5.6 MIL/uL (ref 4.22–5.81)
RDW: 12.2 % (ref 11.5–15.5)
WBC: 8 10*3/uL (ref 4.0–10.5)
nRBC: 0 % (ref 0.0–0.2)

## 2021-05-09 NOTE — Patient Instructions (Signed)

## 2021-05-09 NOTE — Progress Notes (Signed)
Therapeutic phlebotomy performed in [facility] using 20g angiocath. 518m removed. Pt tolerated procedure well. Provided beverage post-procedure. Vital signs stable at discharge.

## 2021-05-23 ENCOUNTER — Other Ambulatory Visit: Payer: Self-pay

## 2021-05-23 ENCOUNTER — Emergency Department
Admission: EM | Admit: 2021-05-23 | Discharge: 2021-05-23 | Disposition: A | Discharge status: Home / Self Care | Payer: BC Managed Care – PPO | Attending: Emergency Medicine | Admitting: Emergency Medicine

## 2021-05-23 ENCOUNTER — Emergency Department: Payer: BC Managed Care – PPO

## 2021-05-23 ENCOUNTER — Ambulatory Visit: Payer: Self-pay | Admitting: *Deleted

## 2021-05-23 DIAGNOSIS — Z79899 Other long term (current) drug therapy: Secondary | ICD-10-CM | POA: Insufficient documentation

## 2021-05-23 DIAGNOSIS — Z20822 Contact with and (suspected) exposure to covid-19: Secondary | ICD-10-CM | POA: Insufficient documentation

## 2021-05-23 DIAGNOSIS — F17210 Nicotine dependence, cigarettes, uncomplicated: Secondary | ICD-10-CM | POA: Insufficient documentation

## 2021-05-23 DIAGNOSIS — Z85038 Personal history of other malignant neoplasm of large intestine: Secondary | ICD-10-CM | POA: Diagnosis not present

## 2021-05-23 DIAGNOSIS — I10 Essential (primary) hypertension: Secondary | ICD-10-CM | POA: Diagnosis not present

## 2021-05-23 DIAGNOSIS — K5732 Diverticulitis of large intestine without perforation or abscess without bleeding: Secondary | ICD-10-CM | POA: Diagnosis not present

## 2021-05-23 DIAGNOSIS — R1031 Right lower quadrant pain: Secondary | ICD-10-CM | POA: Diagnosis present

## 2021-05-23 LAB — RESP PANEL BY RT-PCR (FLU A&B, COVID) ARPGX2
Influenza A by PCR: NEGATIVE
Influenza B by PCR: NEGATIVE
SARS Coronavirus 2 by RT PCR: NEGATIVE

## 2021-05-23 LAB — COMPREHENSIVE METABOLIC PANEL
ALT: 46 U/L — ABNORMAL HIGH (ref 0–44)
AST: 26 U/L (ref 15–41)
Albumin: 4 g/dL (ref 3.5–5.0)
Alkaline Phosphatase: 59 U/L (ref 38–126)
Anion gap: 6 (ref 5–15)
BUN: 12 mg/dL (ref 6–20)
CO2: 25 mmol/L (ref 22–32)
Calcium: 9.5 mg/dL (ref 8.9–10.3)
Chloride: 106 mmol/L (ref 98–111)
Creatinine, Ser: 1.01 mg/dL (ref 0.61–1.24)
GFR, Estimated: >60 mL/min (ref >60)
Glucose, Bld: 114 mg/dL — ABNORMAL HIGH (ref 70–99)
Potassium: 4.6 mmol/L (ref 3.5–5.1)
Sodium: 137 mmol/L (ref 135–145)
Total Bilirubin: 0.8 mg/dL (ref 0.3–1.2)
Total Protein: 7.5 g/dL (ref 6.5–8.1)

## 2021-05-23 LAB — URINALYSIS, COMPLETE (UACMP) WITH MICROSCOPIC
Bacteria, UA: NONE SEEN
Bilirubin Urine: NEGATIVE
Glucose, UA: NEGATIVE mg/dL
Hgb urine dipstick: NEGATIVE
Ketones, ur: NEGATIVE mg/dL
Leukocytes,Ua: NEGATIVE
Nitrite: NEGATIVE
Protein, ur: NEGATIVE mg/dL
Specific Gravity, Urine: 1.013 (ref 1.005–1.030)
Squamous Epithelial / HPF: NONE SEEN (ref 0–5)
WBC, UA: NONE SEEN WBC/hpf (ref 0–5)
pH: 5 (ref 5.0–8.0)

## 2021-05-23 LAB — LIPASE, BLOOD: Lipase: 38 U/L (ref 11–51)

## 2021-05-23 LAB — CBC
HCT: 49 % (ref 39.0–52.0)
Hemoglobin: 17.1 g/dL — ABNORMAL HIGH (ref 13.0–17.0)
MCH: 31.1 pg (ref 26.0–34.0)
MCHC: 34.9 g/dL (ref 30.0–36.0)
MCV: 89.1 fL (ref 80.0–100.0)
Platelets: 213 10*3/uL (ref 150–400)
RBC: 5.5 MIL/uL (ref 4.22–5.81)
RDW: 12.3 % (ref 11.5–15.5)
WBC: 9.9 10*3/uL (ref 4.0–10.5)
nRBC: 0 % (ref 0.0–0.2)

## 2021-05-23 MED ORDER — ONDANSETRON HCL 4 MG/2ML IJ SOLN
4.0000 mg | Freq: Once | INTRAMUSCULAR | Status: AC
  Administered 2021-05-23: 4 mg via INTRAVENOUS
  Filled 2021-05-23: qty 2

## 2021-05-23 MED ORDER — MORPHINE SULFATE (PF) 4 MG/ML IV SOLN
6.0000 mg | Freq: Once | INTRAVENOUS | Status: AC
  Administered 2021-05-23: 6 mg via INTRAVENOUS
  Filled 2021-05-23: qty 2

## 2021-05-23 MED ORDER — LACTATED RINGERS IV BOLUS
1000.0000 mL | Freq: Once | INTRAVENOUS | Status: AC
  Administered 2021-05-23: 1000 mL via INTRAVENOUS

## 2021-05-23 MED ORDER — PIPERACILLIN-TAZOBACTAM 3.375 G IVPB 30 MIN
3.3750 g | Freq: Once | INTRAVENOUS | Status: AC
  Administered 2021-05-23: 3.375 g via INTRAVENOUS
  Filled 2021-05-23: qty 50

## 2021-05-23 MED ORDER — AMOXICILLIN-POT CLAVULANATE 875-125 MG PO TABS: 1.0000 | ORAL_TABLET | Freq: Two times a day (BID) | ORAL | 0 refills | Status: AC

## 2021-05-23 MED ORDER — HYDROCODONE-ACETAMINOPHEN 5-325 MG PO TABS: 1.0000 | ORAL_TABLET | Freq: Four times a day (QID) | ORAL | 0 refills | Status: DC | PRN

## 2021-05-23 MED ORDER — IOHEXOL 350 MG/ML SOLN
100.0000 mL | Freq: Once | INTRAVENOUS | Status: AC | PRN
  Administered 2021-05-23: 100 mL via INTRAVENOUS
  Filled 2021-05-23: qty 100

## 2021-05-23 MED ORDER — MORPHINE SULFATE (PF) 4 MG/ML IV SOLN
4.0000 mg | Freq: Once | INTRAVENOUS | Status: AC
  Administered 2021-05-23: 4 mg via INTRAVENOUS
  Filled 2021-05-23: qty 1

## 2021-05-23 MED ORDER — KETOROLAC TROMETHAMINE 30 MG/ML IJ SOLN
15.0000 mg | Freq: Once | INTRAMUSCULAR | Status: AC
  Administered 2021-05-23: 15 mg via INTRAVENOUS
  Filled 2021-05-23: qty 1

## 2021-05-23 MED ORDER — ONDANSETRON 4 MG PO TBDP: 4.0000 mg | ORAL_TABLET | Freq: Three times a day (TID) | ORAL | 0 refills | Status: DC | PRN

## 2021-05-23 NOTE — ED Provider Notes (Signed)
[facility] Emergency Department Provider Note  ____________________________________________   Event Date/Time   First MD Initiated Contact with Patient 05/23/21 1139     (approximate)  I have reviewed the triage vital signs and the nursing notes.   HISTORY  Chief Complaint Abdominal Pain    HPI Taylor Medina is a 55 y.o. male with history of diverticulosis here with right lower quadrant abdominal pain.  The patient states over the last 3 days, he has had progressively worsening aching, throbbing, right lower quadrant abdominal pain.  He said associated nausea but no vomiting.  He has had some slightly loose stools.  He has a history of diverticulitis with pain somewhat similar to this although this is more persistent and localized more on the right side.  Denies any prior abdominal surgeries.  No fevers but has had some chills.  No recent major dietary changes.  No other complaints.  No recent sick contacts.    Past Medical History:  Diagnosis Date   Diverticulosis    Erythrocytosis 01/04/2021   Guillain Barr syndrome [facility])    High cholesterol    HTN (hypertension)    episodic    Palpitations    Sleep apnea    CPAP   Wears contact lenses     Patient Active Problem List   Diagnosis Date Noted   Erythrocytosis 01/04/2021   Elevated ferritin 01/04/2021   Tobacco use 01/04/2021   Hyperlipidemia 12/12/2020   Elevated liver enzymes 12/12/2020   Esophageal dysphagia    Thickening of esophagus    Reflux esophagitis    Stomach irritation    FH: colon cancer    Special screening for malignant neoplasms, colon    Benign neoplasm of ascending colon    External hemorrhoids    Benign neoplasm of descending colon    Sleep apnea 10/24/2016   Hypertriglyceridemia 12/24/2009   HYPERTENSION, BENIGN 12/24/2009    Past Surgical History:  Procedure Laterality Date   COLONOSCOPY WITH PROPOFOL N/A 12/10/2017   Procedure: COLONOSCOPY WITH PROPOFOL;   Surgeon: Virgel Manifold, MD;  Location: [facility] ENDOSCOPY;  Service: Endoscopy;  Laterality: N/A;   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  Tib/fib fracture   hardware fractions     on R; several surgeries    hernia repair surgery      Prior to Admission medications   Medication Sig Start Date End Date Taking? Authorizing Provider  amoxicillin-clavulanate (AUGMENTIN) 875-125 MG tablet Take 1 tablet by mouth 2 (two) times daily for 10 days. 05/23/21 06/02/21 Yes Duffy Bruce, MD  HYDROcodone-acetaminophen (NORCO/VICODIN) 5-325 MG tablet Take 1 tablet by mouth every 6 (six) hours as needed for moderate pain or severe pain. 05/23/21 05/23/22 Yes Duffy Bruce, MD  ondansetron (ZOFRAN ODT) 4 MG disintegrating tablet Take 1 tablet (4 mg total) by mouth every 8 (eight) hours as needed for nausea or vomiting. 05/23/21  Yes Duffy Bruce, MD  Acetaminophen-Codeine 300-30 MG tablet Take 1 tablet by mouth every 4 (four) hours as needed. Patient not taking: Reported on 04/05/2021 03/18/21   [provider]  amLODipine (NORVASC) 10 MG tablet Take 1 tablet (10 mg  total) by mouth daily. Patient not taking: Reported on 05/09/2021 12/10/20   Trinna Post, PA-C  amoxicillin (AMOXIL) 500 MG capsule Take by mouth. Patient not taking: No sig reported 03/17/21   [provider]  butalbital-acetaminophen-caffeine (FIORICET) 50-325-40 MG tablet Take 1-2 tablets by mouth every 6 (six) hours as needed for headache. 12/10/20 12/10/21  Trinna Post, PA-C  chlorhexidine (PERIDEX) 0.12 % solution 2 (two)  times daily. 03/18/21   [provider]  DENTA 5000 PLUS 1.1 % CREA dental cream Take by mouth at bedtime. 04/02/21   [provider]  ibuprofen (ADVIL) 800 MG tablet Take 800 mg by mouth every 8 (eight) hours as needed. 03/18/21   [provider]  lisinopril-hydrochlorothiazide (ZESTORETIC) 10-12.5 MG tablet Take 1 tablet by mouth daily. 12/10/20   Trinna Post, PA-C  metoprolol succinate (TOPROL-XL) 25 MG 24 hr tablet Take 1 tablet by mouth daily. 04/09/21 04/09/22  [provider]  tadalafil (CIALIS) 20 MG tablet Take 1 tablet (20 mg total) by mouth daily as needed for erectile dysfunction. 08/29/20   Zara Council A, PA-C  sildenafil (VIAGRA) 100 MG tablet Take 1 tablet (100 mg total) by mouth daily as needed for erectile dysfunction. Take two hours prior to intercourse on an empty stomach 07/28/16 07/28/16  Zara Council A, PA-C    Allergies Influenza vaccines  Family History  Problem Relation Age of Onset   Lung cancer Mother    Lung cancer Father    Cancer Other        family hx   Coronary artery disease Other        family hx   Diabetes Other        family hx   Colon cancer Brother     Social History Social History   Tobacco Use   Smoking status: Every Day    Packs/day: 0.50    Years: 6.00    Pack years: 3.00    Types: Cigarettes    Last attempt to quit: 07/28/2013    Years since quitting: 7.8   Smokeless tobacco: Never   Tobacco comments:    smoked for approx 12 yrs. quit for 3. Restarted several months ago  Vaping Use   Vaping Use: Never used  Substance Use Topics   Alcohol use: Yes    Alcohol/week: 5.0 standard drinks    Types: 5 Glasses of wine per week   Drug use: No    Review of Systems  Review of Systems  Constitutional:  Positive for fatigue. Negative for chills and fever.  HENT:  Negative for sore throat.   Respiratory:  Negative for shortness of breath.   Cardiovascular:  Negative for chest pain.   Gastrointestinal:  Positive for abdominal pain, diarrhea and nausea.  Genitourinary:  Negative for flank pain.  Musculoskeletal:  Negative for neck pain.  Skin:  Negative for rash and wound.  Allergic/Immunologic: Negative for immunocompromised state.  Neurological:  Negative for weakness and numbness.  Hematological:  Does not bruise/bleed easily.  All other systems reviewed and are negative.   ____________________________________________  PHYSICAL EXAM:      VITAL SIGNS: ED Triage Vitals  Enc Vitals Group     BP 05/23/21 1012 138/86     Pulse Rate 05/23/21 1012 73     Resp 05/23/21 1012 16     Temp 05/23/21 1012 99 F (37.2 C)     Temp Source 05/23/21 1012 Oral     SpO2 05/23/21 1012 95 %  Weight 05/23/21 1013 180 lb (81.6 kg)     Height 05/23/21 1013 '5\' 9"'$  (1.753 m)     Head Circumference --      Peak Flow --      Pain Score 05/23/21 0951 7     Pain Loc --      Pain Edu? --      Excl. in [place]? --      Physical Exam Vitals and nursing note reviewed.  Constitutional:      General: He is not in acute distress.    Appearance: He is well-developed.  HENT:     Head: Normocephalic and atraumatic.  Eyes:     Conjunctiva/sclera: Conjunctivae normal.  Cardiovascular:     Rate and Rhythm: Normal rate and regular rhythm.     Heart sounds: Normal heart sounds. No murmur heard.   No friction rub.  Pulmonary:     Effort: Pulmonary effort is normal. No respiratory distress.     Breath sounds: Normal breath sounds. No wheezing or rales.  Abdominal:     General: There is no distension.     Palpations: Abdomen is soft.     Tenderness: There is abdominal tenderness in the right lower quadrant and suprapubic area. There is guarding. There is no rebound.  Musculoskeletal:     Cervical back: Neck supple.  Skin:    General: Skin is warm.     Capillary Refill: Capillary refill takes less than 2 seconds.  Neurological:     Mental Status: He is alert and oriented to person,  place, and time.     Motor: No abnormal muscle tone.      ____________________________________________   LABS (all labs ordered are listed, but only abnormal results are displayed)  Labs Reviewed  COMPREHENSIVE METABOLIC PANEL - Abnormal; Notable for the following components:      Result Value   Glucose, Bld 114 (*)    ALT 46 (*)    All other components within normal limits  CBC - Abnormal; Notable for the following components:   Hemoglobin 17.1 (*)    All other components within normal limits  URINALYSIS, COMPLETE (UACMP) WITH MICROSCOPIC - Abnormal; Notable for the following components:   Color, Urine YELLOW (*)    APPearance CLEAR (*)    All other components within normal limits  RESP PANEL BY RT-PCR (FLU A&B, COVID) ARPGX2  LIPASE, BLOOD    ____________________________________________  EKG:  ________________________________________  RADIOLOGY All imaging, including plain films, CT scans, and ultrasounds, independently reviewed by me, and interpretations confirmed via formal radiology reads.  ED MD interpretation:   CT abdomen/pelvis: Diffuse diverticulosis of the colon with acute cecal diverticulitis, normal appendix  Official radiology report(s): CT ABDOMEN PELVIS W CONTRAST  Result Date: 05/23/2021 CLINICAL DATA:  RIGHT lower quadrant abdominal pain for 3 days, low-grade fever, diarrhea EXAM: CT ABDOMEN AND PELVIS WITH CONTRAST TECHNIQUE: Multidetector CT imaging of the abdomen and pelvis was performed using the standard protocol following bolus administration of intravenous contrast. Sagittal and coronal MPR images reconstructed from axial data set. CONTRAST:  147m OMNIPAQUE IOHEXOL 350 MG/ML SOLN IV. No oral contrast. COMPARISON:  10/27/2017 FINDINGS: Lower chest: Dependent atelectasis at lung bases Hepatobiliary: Gallbladder and liver normal appearance Pancreas: Normal appearance Spleen: Normal appearance Adrenals/Urinary Tract: Adrenal glands, kidneys, ureters, and  bladder normal appearance Stomach/Bowel: Normal appendix. Diffuse diverticulosis of colon. Wall thickening with surrounding inflammatory changes at cecum consistent with acute cecal diverticulitis. No evidence of perforation or abscess. Small diverticulum from  cranial aspect of third portion of duodenum. Stomach and bowel loops otherwise normal appearance. Vascular/Lymphatic: Atherosclerotic calcifications aorta and iliac arteries without aneurysm. No adenopathy. Reproductive: Prostate gland and seminal vesicles unremarkable Other: No free air or free fluid.  No hernia. Musculoskeletal: Unremarkable IMPRESSION: Diffuse diverticulosis of the colon with acute cecal diverticulitis. No evidence of perforation or abscess. Electronically Signed   By: Lavonia Quinlan M.D.   On: 05/23/2021 13:17    ____________________________________________  PROCEDURES   Procedure(s) performed (including Critical Care):  Procedures  ____________________________________________  INITIAL IMPRESSION / MDM / [place] / ED COURSE  As part of my medical decision making, I reviewed the following data within the [facility] notes reviewed and incorporated, Old chart reviewed, Notes from prior ED visits, and [place] Controlled Substance Database       *Taylor Medina was evaluated in Emergency Department on 05/23/2021 for the symptoms described in the history of present illness. He was evaluated in the context of the global COVID-19 pandemic, which necessitated consideration that the patient might be at risk for infection with the SARS-CoV-2 virus that causes COVID-19. Institutional protocols and algorithms that pertain to the evaluation of patients at risk for COVID-19 are in a state of rapid change based on information released by regulatory bodies including the CDC and federal and state organizations. These policies and algorithms were followed during the patient's care in the ED.  Some ED evaluations  and interventions may be delayed as a result of limited staffing during the pandemic.*     Medical Decision Making: 55 year old male here with recurrent abdominal pain.  Clinically, differential includes diverticulitis versus appendicitis.  White blood cell count is normal.  Patient is afebrile and hemodynamically stable.  Renal function and LFTs are unremarkable.  CT of the abdomen and pelvis obtained, reviewed, and shows evidence of acute cecal diverticulitis but the appendix appears normal.  No evidence of perforation or abscess.  I discussed diagnosis with the patient.  We discussed that appendix looks normal but he should monitor very closely for any worsening symptoms despite being on antibiotics.  He was given a dose of Zosyn.  Will discharge on outpatient antibiotics with GI follow-up.  ____________________________________________  FINAL CLINICAL IMPRESSION(S) / ED DIAGNOSES  Final diagnoses:  Cecal diverticulitis     MEDICATIONS GIVEN DURING THIS VISIT:  Medications  morphine 4 MG/ML injection 6 mg (6 mg Intravenous Given 05/23/21 1158)  ondansetron (ZOFRAN) injection 4 mg (4 mg Intravenous Given 05/23/21 1156)  lactated ringers bolus 1,000 mL (0 mLs Intravenous Stopped 05/23/21 1329)  iohexol (OMNIPAQUE) 350 MG/ML injection 100 mL (100 mLs Intravenous Contrast Given 05/23/21 1241)  piperacillin-tazobactam (ZOSYN) IVPB 3.375 g (0 g Intravenous Stopped 05/23/21 1418)  ketorolac (TORADOL) 30 MG/ML injection 15 mg (15 mg Intravenous Given 05/23/21 1348)  morphine 4 MG/ML injection 4 mg (4 mg Intravenous Given 05/23/21 1348)     ED Discharge Orders          Ordered    amoxicillin-clavulanate (AUGMENTIN) 875-125 MG tablet  2 times daily        05/23/21 1429    HYDROcodone-acetaminophen (NORCO/VICODIN) 5-325 MG tablet  Every 6 hours PRN        05/23/21 1429    ondansetron (ZOFRAN ODT) 4 MG disintegrating tablet  Every 8 hours PRN        05/23/21 1429             Note:  This  document was prepared  using Systems analyst and may include unintentional dictation errors.   Duffy Bruce, MD 05/23/21 1511

## 2021-05-23 NOTE — Telephone Encounter (Signed)
FYI-patient referred to ED

## 2021-05-23 NOTE — Telephone Encounter (Signed)
Pt called in c/o abd pain 3-4 inches to the right of his navel.   Any pressure on his abd or bodily movement causing pain.   He was grunting in pain with movement while on the phone with me    He suspects appendicitis.   He has diverticulitis but this feels different.  I referred him to the ED per the protocol.   [facility].      See triage notes.  I sent my notes to [facility]

## 2021-05-23 NOTE — Telephone Encounter (Signed)
Noted  

## 2021-05-23 NOTE — Telephone Encounter (Signed)
Reason for Disposition  [1] SEVERE pain (e.g., excruciating) AND [2] present > 1 hour  Answer Assessment - Initial Assessment Questions 1. LOCATION: "Where does it hurt?"      If I touch or push 3-4 inches to right side of navel.     I have diverticulitis.   This feels different.   It's worse when I move around.  2. RADIATION: "Does the pain shoot anywhere else?" (e.g., chest, back)     It feels warmer on my right side.   Fever up to 100. 3. ONSET: "When did the pain begin?" (Minutes, hours or days ago)      3 days ago.   No vomiting.   Some diarrhea.   Not a lot coming out but going often. 4. SUDDEN: "Gradual or sudden onset?"     Started all at once.   Suddenly.   Woke up with it hurting. 5. PATTERN "Does the pain come and go, or is it constant?"    - If constant: "Is it getting better, staying the same, or worsening?"      (Note: Constant means the pain never goes away completely; most serious pain is constant and it progresses)     - If intermittent: "How long does it last?" "Do you have pain now?"     (Note: Intermittent means the pain goes away completely between bouts)     Constant  6. SEVERITY: "How bad is the pain?"  (e.g., Scale 1-10; mild, moderate, or severe)    - MILD (1-3): doesn't interfere with normal activities, abdomen soft and not tender to touch     - MODERATE (4-7): interferes with normal activities or awakens from sleep, abdomen tender to touch     - SEVERE (8-10): excruciating pain, doubled over, unable to do any normal activities       5-6 7. RECURRENT SYMPTOM: "Have you ever had this type of stomach pain before?" If Yes, ask: "When was the last time?" and "What happened that time?"      No this is a different pain than diverticulitis.    Last flare up was Jan or Feb. 8. CAUSE: "What do you think is causing the stomach pain?"     Appendicitis     Simona Huh gave me an antibiotic for an abscessed tooth a couple of months ago.    I've taken 4 total over the last few days  to see if it would help.  It did not make any changes in how I feel.    I'm wondering if this is masking what is going on.     9. RELIEVING/AGGRAVATING FACTORS: "What makes it better or worse?" (e.g., movement, antacids, bowel movement)     Moving around makes it hurt. 10. OTHER SYMPTOMS: "Do you have any other symptoms?" (e.g., back pain, diarrhea, fever, urination pain, vomiting)       Yes fever 100 this morning.  No urinary symptoms.  Protocols used: Abdominal Pain - Male-A-AH

## 2021-05-23 NOTE — ED Triage Notes (Signed)
Pt come with c/o RLQ pain for three days. Pt states low fever and diarrhea.  Pt states his PCP sent him here.

## 2021-06-04 ENCOUNTER — Inpatient Hospital Stay: Payer: BC Managed Care – PPO

## 2021-06-04 ENCOUNTER — Other Ambulatory Visit: Payer: Self-pay

## 2021-06-04 ENCOUNTER — Inpatient Hospital Stay: Payer: BC Managed Care – PPO | Attending: Oncology

## 2021-06-04 DIAGNOSIS — D751 Secondary polycythemia: Secondary | ICD-10-CM | POA: Insufficient documentation

## 2021-06-04 LAB — HEMOGLOBIN AND HEMATOCRIT, BLOOD
HCT: 47.1 % (ref 39.0–52.0)
Hemoglobin: 16.3 g/dL (ref 13.0–17.0)

## 2021-06-04 NOTE — Progress Notes (Signed)
According to treatment parameters, phlebotomy not indicated at this time. Patient informed and verbalized understanding.

## 2021-06-10 ENCOUNTER — Ambulatory Visit (INDEPENDENT_AMBULATORY_CARE_PROVIDER_SITE_OTHER): Payer: BC Managed Care – PPO | Admitting: Family Medicine

## 2021-06-10 ENCOUNTER — Encounter: Payer: Self-pay | Admitting: Family Medicine

## 2021-06-10 ENCOUNTER — Other Ambulatory Visit: Payer: Self-pay

## 2021-06-10 VITALS — BP 130/85 | HR 57 | Temp 98.3°F | Ht 69.0 in | Wt 185.1 lb

## 2021-06-10 DIAGNOSIS — E785 Hyperlipidemia, unspecified: Secondary | ICD-10-CM

## 2021-06-10 DIAGNOSIS — Z8719 Personal history of other diseases of the digestive system: Secondary | ICD-10-CM | POA: Diagnosis not present

## 2021-06-10 DIAGNOSIS — Z720 Tobacco use: Secondary | ICD-10-CM

## 2021-06-10 DIAGNOSIS — I10 Essential (primary) hypertension: Secondary | ICD-10-CM

## 2021-06-10 DIAGNOSIS — R748 Abnormal levels of other serum enzymes: Secondary | ICD-10-CM

## 2021-06-10 DIAGNOSIS — R002 Palpitations: Secondary | ICD-10-CM

## 2021-06-10 DIAGNOSIS — R739 Hyperglycemia, unspecified: Secondary | ICD-10-CM

## 2021-06-10 MED ORDER — VARENICLINE TARTRATE 0.5 MG PO TABS: ORAL_TABLET | ORAL | 0 refills | Status: DC

## 2021-06-10 MED ORDER — VARENICLINE TARTRATE 1 MG PO TABS: 1.0000 mg | ORAL_TABLET | Freq: Two times a day (BID) | ORAL | 5 refills | Status: DC

## 2021-06-10 NOTE — Assessment & Plan Note (Signed)
-   Chronic and well-controlled - Continue Metoprolol - Continue following with cardiology

## 2021-06-10 NOTE — Progress Notes (Signed)
Established patient visit   Patient: Taylor Medina   DOB: [birth year]   55 y.o. Male  MRN: [id number] Visit Date: 06/10/2021  Today's healthcare provider: Lavon Paganini, MD   Chief Complaint  Patient presents with   Follow-up   Hypertension   Subjective    HPI  Hypertension - Currently taking Lisinopril-HCTZ & Metoprolol; recently discontinued Amlodipine - Followed by cardiology - Pt. states that BP at home ranges 120-130/60-70 - Denies chest pain, SOB, leg swelling, headaches, & vision changes  Palpitations - Followed by cardiology - Pt. reports improvement in palpitations since starting Metoprolol  Diverticulitis - Pt. has had 2 episodes of diverticulitis since January, was treated in the ED both times - Pt. previously saw GI 3 years ago for colonic polyps, but has not been followed since then  Tobacco Cessation - Pt. states that he quit using tobacco 2 weeks ago and is seeking resources for maintaining tobacco cessation - Expresses interest in starting Chantix    Medications: Outpatient Medications Prior to Visit  Medication Sig   amoxicillin (AMOXIL) 500 MG capsule Take by mouth.   butalbital-acetaminophen-caffeine (FIORICET) 50-325-40 MG tablet Take 1-2 tablets by mouth every 6 (six) hours as needed for headache.   chlorhexidine (PERIDEX) 0.12 % solution 2 (two) times daily.   DENTA 5000 PLUS 1.1 % CREA dental cream Take by mouth at bedtime.   HYDROcodone-acetaminophen (NORCO/VICODIN) 5-325 MG tablet Take 1 tablet by mouth every 6 (six) hours as needed for moderate pain or severe pain.   ibuprofen (ADVIL) 800 MG tablet Take 800 mg by mouth every 8 (eight) hours as needed.   lisinopril-hydrochlorothiazide (ZESTORETIC) 10-12.5 MG tablet Take 1 tablet by mouth daily.   metoprolol succinate (TOPROL-XL) 25 MG 24 hr tablet Take 1 tablet by mouth daily.   ondansetron (ZOFRAN ODT) 4 MG disintegrating tablet Take 1 tablet (4 mg total) by mouth every 8 (eight) hours  as needed for nausea or vomiting.   tadalafil (CIALIS) 20 MG tablet Take 1 tablet (20 mg total) by mouth daily as needed for erectile dysfunction.   [DISCONTINUED] amLODipine (NORVASC) 10 MG tablet Take 1 tablet (10 mg total) by mouth daily.   [DISCONTINUED] Acetaminophen-Codeine 300-30 MG tablet Take 1 tablet by mouth every 4 (four) hours as needed. (Patient not taking: Reported on 04/05/2021)   No facility-administered medications prior to visit.    Review of Systems  Constitutional:  Negative for activity change, appetite change, fatigue and fever.  HENT: Negative.    Eyes: Negative.  Negative for visual disturbance.  Respiratory:  Positive for cough. Negative for chest tightness and shortness of breath.   Cardiovascular: Negative.  Negative for chest pain and leg swelling.  Gastrointestinal: Negative.   Endocrine: Negative.   Genitourinary: Negative.   Musculoskeletal: Negative.   Skin: Negative.   Psychiatric/Behavioral: Negative.         Objective    BP 130/85 (BP Location: Left Arm, Patient Position: Sitting, Cuff Size: Normal)   Pulse (!) 57   Temp 98.3 F (36.8 C) (Oral)   Ht '5\' 9"'$  (1.753 m)   Wt 185 lb 1.6 oz (84 kg)   SpO2 97%   BMI 27.33 kg/m      Physical Exam Constitutional:      General: He is not in acute distress.    Appearance: Normal appearance. He is normal weight.  HENT:     Head: Normocephalic and atraumatic.     Right Ear: External ear normal.  Left Ear: External ear normal.  Eyes:     Conjunctiva/sclera: Conjunctivae normal.  Cardiovascular:     Rate and Rhythm: Normal rate and regular rhythm.     Pulses: Normal pulses.     Heart sounds: Normal heart sounds.  Pulmonary:     Effort: Pulmonary effort is normal.     Breath sounds: Normal breath sounds.  Abdominal:     General: Abdomen is flat. Bowel sounds are normal. There is no distension.     Palpations: Abdomen is soft.     Tenderness: There is no abdominal tenderness.  Skin:     General: Skin is warm and dry.  Neurological:     Mental Status: He is alert.  Psychiatric:        Behavior: Behavior normal.        Thought Content: Thought content normal.     No results found for any visits on 06/10/21.  Assessment & Plan     Problem List Items Addressed This Visit       Cardiovascular and Mediastinum   HYPERTENSION, BENIGN - Primary    - Chronic and well-controlled - Continue Lisinopril-HCTZ and Metoprolol - Continue following with cardiology        Other   Palpitations    - Chronic and well-controlled - Continue Metoprolol - Continue following with cardiology      Hyperlipidemia    - Chronic, not currently managed with medications - Recheck lipid panel - May consider adding statin pending lab results      Relevant Orders   Lipid panel   Elevated liver enzymes    - Previous episodic transaminitis, resolved on most recent CMP - Currently asymptomatic - Recheck liver enzymes - Will continue to monitor      Relevant Orders   Hepatic function panel   Tobacco use    - Congratulated pt. on successful efforts towards tobacco cessation - Discussed tobacco cessation resources, including 1-800-QuitLine - Start Chantix - Pt. declines nicotine replacement therapies at this time - Will continue to monitor      Other Visit Diagnoses     History of diverticulitis       - Two acute episodes of diverticulitis since January, both successfully treated with antibiotics and pain medications after presenting to the ED - Refer to GI for further workup and management    Relevant Orders   Ambulatory referral to Gastroenterology   Hyperglycemia       - Intermittent episodes of hyperglycemia noticed on recent metabolic panels - No prior history of diabetes - Check HbA1c    Relevant Orders   Hemoglobin A1c        Return in about 6 months (around 12/10/2021) for chronic disease f/u, With new PCP.        Percell Locus, MS3    Patient seen  along with MS3 student Percell Locus. I personally evaluated this patient along with the student, and verified all aspects of the history, physical exam, and medical decision making as documented by the student. I agree with the student's documentation and have made all necessary edits.  Dorothia Passmore, Dionne Bucy, MD, MPH [facility]

## 2021-06-10 NOTE — Assessment & Plan Note (Signed)
-   Previous episodic transaminitis, resolved on most recent CMP - Currently asymptomatic - Recheck liver enzymes - Will continue to monitor

## 2021-06-10 NOTE — Assessment & Plan Note (Signed)
-   Congratulated pt. on successful efforts towards tobacco cessation - Discussed tobacco cessation resources, including 1-800-QuitLine - Start Chantix - Pt. declines nicotine replacement therapies at this time - Will continue to monitor

## 2021-06-10 NOTE — Assessment & Plan Note (Signed)
-   Chronic and well-controlled - Continue Lisinopril-HCTZ and Metoprolol - Continue following with cardiology

## 2021-06-10 NOTE — Assessment & Plan Note (Signed)
-   Chronic, not currently managed with medications - Recheck lipid panel - May consider adding statin pending lab results

## 2021-07-02 ENCOUNTER — Other Ambulatory Visit: Payer: Self-pay | Admitting: Family Medicine

## 2021-07-04 ENCOUNTER — Telehealth: Payer: Self-pay | Admitting: Family Medicine

## 2021-07-04 DIAGNOSIS — I10 Essential (primary) hypertension: Secondary | ICD-10-CM

## 2021-07-04 MED ORDER — LISINOPRIL-HYDROCHLOROTHIAZIDE 10-12.5 MG PO TABS: 1.0000 | ORAL_TABLET | Freq: Every day | ORAL | 1 refills | Status: DC

## 2021-07-04 NOTE — Telephone Encounter (Signed)
[facility] faxed refill request for the following medications:  lisinopril-hydrochlorothiazide (ZESTORETIC) 10-12.5 MG tablet   Please advise.  

## 2021-07-05 ENCOUNTER — Other Ambulatory Visit: Payer: Self-pay

## 2021-07-05 DIAGNOSIS — D751 Secondary polycythemia: Secondary | ICD-10-CM

## 2021-07-08 ENCOUNTER — Inpatient Hospital Stay: Payer: BC Managed Care – PPO

## 2021-07-08 ENCOUNTER — Encounter: Payer: Self-pay | Admitting: Oncology

## 2021-07-08 ENCOUNTER — Inpatient Hospital Stay: Payer: BC Managed Care – PPO | Admitting: Oncology

## 2021-07-19 ENCOUNTER — Encounter: Payer: Self-pay | Admitting: General Surgery

## 2021-08-26 ENCOUNTER — Other Ambulatory Visit: Payer: Self-pay

## 2021-08-26 DIAGNOSIS — N401 Enlarged prostate with lower urinary tract symptoms: Secondary | ICD-10-CM

## 2021-08-26 DIAGNOSIS — N138 Other obstructive and reflux uropathy: Secondary | ICD-10-CM

## 2021-08-27 ENCOUNTER — Other Ambulatory Visit: Payer: BC Managed Care – PPO

## 2021-08-27 ENCOUNTER — Other Ambulatory Visit: Payer: Self-pay

## 2021-08-27 DIAGNOSIS — N401 Enlarged prostate with lower urinary tract symptoms: Secondary | ICD-10-CM | POA: Diagnosis not present

## 2021-08-27 DIAGNOSIS — N138 Other obstructive and reflux uropathy: Secondary | ICD-10-CM

## 2021-08-28 LAB — PSA: Prostate Specific Ag, Serum: 0.3 ng/mL (ref 0.0–4.0)

## 2021-08-28 NOTE — Progress Notes (Signed)
08/29/2021 9:18 AM   Taylor Medina 1965-12-21 [id number]  Referring provider: Trinna Post, PA-C No address on file  Chief Complaint  Patient presents with   Benign Prostatic Hypertrophy    Urological history: 1. ED -contributing factors of age, BPH, HTN, HLD and sleep apnea -SHIM 14 -managed with tadalafil 20 mg, on-demand-dosing  2. BPH with LU TS -PSA 0.3 08/2021 -I PSS 2/1  HPI: Taylor Medina is a 55 y.o. male who presents today for yearly visit.    Patient still having spontaneous erections.  He denies any pain or curvature with erections.     SHIM     Row Name 08/29/21 0855         SHIM: Over the last 6 months:   How do you rate your confidence that you could get and keep an erection? Low     When you had erections with sexual stimulation, how often were your erections hard enough for penetration (entering your partner)? Sometimes (about half the time)     During sexual intercourse, how often were you able to maintain your erection after you had penetrated (entered) your partner? Sometimes (about half the time)     During sexual intercourse, how difficult was it to maintain your erection to completion of intercourse? Difficult     When you attempted sexual intercourse, how often was it satisfactory for you? Sometimes (about half the time)       SHIM Total Score   SHIM 14               Score: 1-7 Severe ED 8-11 Moderate ED 12-16 Mild-Moderate ED 17-21 Mild ED 22-25 No ED  He has been experiencing post void dribbling.  He has a moderate urinary stream.   Patient denies any modifying or aggravating factors.  Patient denies any gross hematuria, dysuria or suprapubic/flank pain.  Patient denies any fevers, chills, nausea or vomiting.     IPSS     Row Name 08/29/21 0800         International Prostate Symptom Score   How often have you had the sensation of not emptying your bladder? Not at All     How often have you had to urinate less than  every two hours? Not at All     How often have you found you stopped and started again several times when you urinated? Not at All     How often have you found it difficult to postpone urination? Less than 1 in 5 times     How often have you had a weak urinary stream? Less than 1 in 5 times     How often have you had to strain to start urination? Not at All     How many times did you typically get up at night to urinate? None     Total IPSS Score 2       Quality of Life due to urinary symptoms   If you were to spend the rest of your life with your urinary condition just the way it is now how would you feel about that? 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  Tib/fib fracture   hardware fractions     on R; several surgeries    hernia repair surgery      Home Medications:  Allergies as of 08/29/2021       Reactions   Influenza Vaccines    Guillain barre syndrome        Medication List        Accurate as of  August 29, 2021  9:18 AM. If you have any questions, ask your nurse or doctor.          STOP taking these medications    butalbital-acetaminophen-caffeine 50-325-40 MG tablet Commonly known as: FIORICET Stopped by: Zara Council, PA-C       TAKE these medications    amoxicillin 500 MG capsule Commonly known as: AMOXIL Take by mouth.   chlorhexidine 0.12 % solution Commonly known as: PERIDEX 2 (two) times daily.   Denta 5000 Plus 1.1 % Crea dental cream Generic drug: sodium fluoride Take by mouth at bedtime.   HYDROcodone-acetaminophen 5-325 MG tablet Commonly known as: NORCO/VICODIN Take 1 tablet by mouth every 6 (six) hours as needed for moderate pain or severe pain.   ibuprofen 800 MG tablet Commonly known as: ADVIL Take 800 mg by mouth every 8 (eight) hours as needed.   lisinopril-hydrochlorothiazide 10-12.5 MG tablet Commonly known as: ZESTORETIC Take 1 tablet by mouth daily.   metoprolol succinate 25 MG 24 hr tablet Commonly known as: TOPROL-XL Take 1 tablet by mouth daily.   ondansetron 4 MG disintegrating tablet Commonly known as: Zofran ODT Take 1 tablet (4 mg total) by mouth every 8 (eight) hours as needed for nausea or vomiting.   tadalafil 5 MG tablet Commonly known as: CIALIS Take 1 tablet (5 mg total) by mouth daily as needed for erectile dysfunction. What changed: You were already taking a medication with the same name, and this prescription was added. Make sure you understand how and when to take each. Changed by: Zara Council, PA-C   tadalafil 20 MG tablet Commonly known as: CIALIS Take 1 tablet (20 mg total) by mouth daily as needed for erectile dysfunction. What changed: Another medication with the same name was added. Make sure you understand how and when to take each. Changed by: Zara Council, PA-C   varenicline 0.5 MG tablet Commonly known as: Chantix Take 0.5 mg PO once daily for 3 days, then increase to one 0.5 mg tablet  twice daily for 4 days, then increase to 1 mg twice daily.   varenicline 1 MG tablet Commonly known as: Chantix Continuing Month Pak Take 1 tablet (1 mg total) by mouth 2 (two) times daily.        Allergies:  Allergies  Allergen Reactions   Influenza Vaccines     Guillain barre syndrome  Family History: Family History  Problem Relation Age of Onset   Lung cancer Mother    Lung cancer Father    Cancer Other        family hx   Coronary artery disease Other        family hx   Diabetes Other        family hx   Colon cancer Brother     Social History:  reports that he quit smoking about 8 years ago. His smoking use included cigarettes. He has never used smokeless tobacco. He reports current alcohol use of about 5.0 standard drinks per week. He reports that he does not use drugs.  ROS: For pertinent review of systems please refer to history of present illness  Physical Exam: BP 118/79   Pulse (!) 50   Ht 5\' 9"  (1.753 m)   Wt 185 lb (83.9 kg)   BMI 27.32 kg/m   Constitutional:  Well nourished. Alert and oriented, No acute distress. HEENT: [place] AT, mask in place.  Trachea midline Cardiovascular: No clubbing, cyanosis, or edema. Respiratory: Normal respiratory effort, no increased work of breathing. GU: No CVA tenderness.  No bladder fullness or masses.  Patient with circumcised phallus. Urethral meatus is patent.  No penile discharge. No penile lesions or rashes. Scrotum without lesions, cysts, rashes and/or edema.  Testicles are located scrotally bilaterally. No masses are appreciated in the testicles. Left and right epididymis are normal. Rectal: Patient with  normal sphincter tone. Anus and perineum without scarring or rashes. No rectal masses are appreciated. Prostate is approximately 45 grams, no nodules are appreciated. Seminal vesicles could not be palpated Neurologic: Grossly intact, no focal deficits, moving all 4 extremities. Psychiatric: Normal mood and affect.    Laboratory Data: Lab Results  Component Value Date   WBC 9.9 05/23/2021   HGB 16.3 06/04/2021   HCT 47.1 06/04/2021   MCV 89.1 05/23/2021   PLT 213 05/23/2021    Lab Results  Component Value Date   CREATININE 1.01 05/23/2021   Lab Results  Component Value Date   TESTOSTERONE 305 08/08/2016   Component     Latest Ref Rng & Units 05/23/2021  Color, Urine     YELLOW YELLOW (A)  Appearance     CLEAR CLEAR (A)  Specific Gravity, Urine     1.005 - 1.030 1.013  pH     5.0 - 8.0 5.0  Glucose, UA     NEGATIVE mg/dL NEGATIVE  Hgb urine dipstick     NEGATIVE NEGATIVE  Bilirubin Urine     NEGATIVE NEGATIVE  Ketones, ur     NEGATIVE mg/dL NEGATIVE  Protein     NEGATIVE mg/dL NEGATIVE  Nitrite     NEGATIVE NEGATIVE  Leukocytes,Ua     NEGATIVE NEGATIVE  RBC / HPF     0 - 5 RBC/hpf 0-5  WBC, UA     0 - 5 WBC/hpf NONE SEEN  Bacteria, UA     NONE SEEN NONE SEEN  Squamous Epithelial / LPF     0 - 5 NONE SEEN  Mucus      PRESENT  I have reviewed the labs.  Pertinent Imaging N/A  Assessment & Plan:    1. Erectile dysfunction -continue tadalafil 20 mg, on-demand-dosing-refill given  2. BPH with LUTS -PSA stable -DRE benign -UA benign -symptoms - post-void dribbling  -continue conservative management, avoiding bladder irritants and timed voiding's -Initiate tadalafil 5 mg daily - prescription sent to pharmacy  Zara Council, PA-C  [facility] [address], [place], [place] [address] [phone]

## 2021-08-29 ENCOUNTER — Encounter: Payer: Self-pay | Admitting: Urology

## 2021-08-29 ENCOUNTER — Ambulatory Visit (INDEPENDENT_AMBULATORY_CARE_PROVIDER_SITE_OTHER): Payer: BC Managed Care – PPO | Admitting: Urology

## 2021-08-29 ENCOUNTER — Other Ambulatory Visit: Payer: Self-pay

## 2021-08-29 VITALS — BP 118/79 | HR 50 | Ht 69.0 in | Wt 185.0 lb

## 2021-08-29 DIAGNOSIS — N5201 Erectile dysfunction due to arterial insufficiency: Secondary | ICD-10-CM | POA: Diagnosis not present

## 2021-08-29 DIAGNOSIS — N401 Enlarged prostate with lower urinary tract symptoms: Secondary | ICD-10-CM | POA: Diagnosis not present

## 2021-08-29 DIAGNOSIS — N138 Other obstructive and reflux uropathy: Secondary | ICD-10-CM

## 2021-08-29 MED ORDER — TADALAFIL 5 MG PO TABS
5.0000 mg | ORAL_TABLET | Freq: Every day | ORAL | 3 refills | Status: DC | PRN
Start: 2021-08-29 — End: 2022-08-29

## 2021-08-29 MED ORDER — TADALAFIL 20 MG PO TABS: 20.0000 mg | ORAL_TABLET | Freq: Every day | ORAL | 6 refills | Status: DC | PRN

## 2021-12-10 ENCOUNTER — Ambulatory Visit: Payer: BC Managed Care – PPO | Admitting: Family Medicine

## 2021-12-10 ENCOUNTER — Other Ambulatory Visit: Payer: Self-pay

## 2021-12-10 ENCOUNTER — Encounter: Payer: Self-pay | Admitting: Family Medicine

## 2021-12-10 VITALS — BP 130/70 | HR 72 | Temp 97.7°F | Resp 16 | Ht 69.0 in | Wt 175.1 lb

## 2021-12-10 DIAGNOSIS — R002 Palpitations: Secondary | ICD-10-CM

## 2021-12-10 DIAGNOSIS — Z5320 Procedure and treatment not carried out because of patient's decision for unspecified reasons: Secondary | ICD-10-CM | POA: Insufficient documentation

## 2021-12-10 DIAGNOSIS — I10 Essential (primary) hypertension: Secondary | ICD-10-CM

## 2021-12-10 DIAGNOSIS — E785 Hyperlipidemia, unspecified: Secondary | ICD-10-CM | POA: Diagnosis not present

## 2021-12-10 DIAGNOSIS — Z720 Tobacco use: Secondary | ICD-10-CM | POA: Diagnosis not present

## 2021-12-10 DIAGNOSIS — G43009 Migraine without aura, not intractable, without status migrainosus: Secondary | ICD-10-CM | POA: Insufficient documentation

## 2021-12-10 MED ORDER — VARENICLINE TARTRATE 0.5 MG PO TABS: ORAL_TABLET | ORAL | 0 refills | Status: DC

## 2021-12-10 MED ORDER — BUTALBITAL-APAP-CAFFEINE 50-325-40 MG PO TABS: 1.0000 | ORAL_TABLET | Freq: Four times a day (QID) | ORAL | 0 refills | Status: DC | PRN

## 2021-12-10 MED ORDER — VARENICLINE TARTRATE 1 MG PO TABS: 1.0000 mg | ORAL_TABLET | Freq: Two times a day (BID) | ORAL | 5 refills | Status: DC

## 2021-12-10 NOTE — Assessment & Plan Note (Signed)
-   Chronic, stable on home readings - Continue Lisinopril-HCTZ and Metoprolol - Continue following with cardiology - Denies CP, SOB, DOE, LE edema - Refills appear stable; pt will double check and will message/call office

## 2021-12-10 NOTE — Assessment & Plan Note (Signed)
Infrequent migraines; encourage use of headache journal to assist with ID of triggers -increase water -focus on sleep -decrease ETOH -decrease processed foods/packaged fooods

## 2021-12-10 NOTE — Progress Notes (Signed)
Established patient visit   Patient: Taylor Medina   DOB: 1966/09/21   56 y.o. Male  MRN: [id number] Visit Date: 12/10/2021  Today's healthcare provider: Gwyneth Sprout, FNP   I,Joseline E Rosas,acting as a scribe for Gwyneth Sprout, FNP.,have documented all relevant documentation on the behalf of Gwyneth Sprout, FNP,as directed by  Gwyneth Sprout, FNP while in the presence of Gwyneth Sprout, FNP.   Chief Complaint  Patient presents with   follow-up HTN   Subjective    HPI  Hypertension, follow-up  BP Readings from Last 3 Encounters:  12/10/21 130/70  08/29/21 118/79  06/10/21 130/85   Wt Readings from Last 3 Encounters:  12/10/21 175 lb 1.6 oz (79.4 kg)  08/29/21 185 lb (83.9 kg)  06/10/21 185 lb 1.6 oz (84 kg)     He was last seen for hypertension 6 months ago.  BP at that visit was 118/79. Management since that visit includes Continue Lisinopril-HCTZ and Metoprolol.Continue following with cardiology  He reports excellent compliance with treatment. He is not having side effects.  He is following a Regular diet. He is exercising. Walks a lot.  Outside blood pressures are 120-130/70. Symptoms: No chest pain No chest pressure  No palpitations No syncope  No dyspnea No orthopnea  No paroxysmal nocturnal dyspnea No lower extremity edema   Pertinent labs: Lab Results  Component Value Date   CHOL 223 (H) 12/06/2020   HDL 32 (L) 12/06/2020   LDLCALC 102 (H) 12/06/2020   LDLDIRECT 99 04/07/2019   TRIG 523 (H) 12/06/2020   CHOLHDL 7.0 (H) 12/06/2020   Lab Results  Component Value Date   NA 137 05/23/2021   K 4.6 05/23/2021   CREATININE 1.01 05/23/2021   GFRNONAA >60 05/23/2021   GLUCOSE 114 (H) 05/23/2021   TSH 1.400 12/06/2020     The 10-year ASCVD risk score (Arnett DK, et al., 2019) is: 9.7%   ---------------------------------------------------------------------------------------------------   Medications: Outpatient Medications Prior to Visit   Medication Sig   chlorhexidine (PERIDEX) 0.12 % solution 2 (two) times daily.   DENTA 5000 PLUS 1.1 % CREA dental cream Take by mouth at bedtime.   ibuprofen (ADVIL) 800 MG tablet Take 800 mg by mouth every 8 (eight) hours as needed.   lisinopril-hydrochlorothiazide (ZESTORETIC) 10-12.5 MG tablet Take 1 tablet by mouth daily.   metoprolol succinate (TOPROL-XL) 25 MG 24 hr tablet Take 1 tablet by mouth daily.   tadalafil (CIALIS) 20 MG tablet Take 1 tablet (20 mg total) by mouth daily as needed for erectile dysfunction.   tadalafil (CIALIS) 5 MG tablet Take 1 tablet (5 mg total) by mouth daily as needed for erectile dysfunction.   [DISCONTINUED] varenicline (CHANTIX CONTINUING MONTH PAK) 1 MG tablet Take 1 tablet (1 mg total) by mouth 2 (two) times daily.   [DISCONTINUED] varenicline (CHANTIX) 0.5 MG tablet Take 0.5 mg PO once daily for 3 days, then increase to one 0.5 mg tablet twice daily for 4 days, then increase to 1 mg twice daily.   [DISCONTINUED] amoxicillin (AMOXIL) 500 MG capsule Take by mouth.   [DISCONTINUED] HYDROcodone-acetaminophen (NORCO/VICODIN) 5-325 MG tablet Take 1 tablet by mouth every 6 (six) hours as needed for moderate pain or severe pain.   [DISCONTINUED] ondansetron (ZOFRAN ODT) 4 MG disintegrating tablet Take 1 tablet (4 mg total) by mouth every 8 (eight) hours as needed for nausea or vomiting.   No facility-administered medications prior to visit.    Review  of Systems     Objective    BP 130/70 Comment: home reading   Pulse 72    Temp 97.7 F (36.5 C) (Temporal)    Resp 16    Ht 5\' 9"  (1.753 m)    Wt 175 lb 1.6 oz (79.4 kg)    BMI 25.86 kg/m    Physical Exam Vitals and nursing note reviewed.  Constitutional:      Appearance: Normal appearance. He is overweight.  HENT:     Head: Normocephalic and atraumatic.  Eyes:     Pupils: Pupils are equal, round, and reactive to light.  Cardiovascular:     Rate and Rhythm: Normal rate and regular rhythm.      Pulses: Normal pulses.     Heart sounds: Normal heart sounds.  Pulmonary:     Effort: Pulmonary effort is normal.     Breath sounds: Normal breath sounds.  Musculoskeletal:        General: Normal range of motion.     Cervical back: Normal range of motion.  Skin:    General: Skin is warm and dry.     Capillary Refill: Capillary refill takes less than 2 seconds.  Neurological:     General: No focal deficit present.     Mental Status: He is alert and oriented to person, place, and time. Mental status is at baseline.  Psychiatric:        Mood and Affect: Mood normal.        Behavior: Behavior normal.        Thought Content: Thought content normal.        Judgment: Judgment normal.     No results found for any visits on 12/10/21.  Assessment & Plan     Problem List Items Addressed This Visit       Cardiovascular and Mediastinum   HYPERTENSION, BENIGN - Primary    - Chronic, stable on home readings - Continue Lisinopril-HCTZ and Metoprolol - Continue following with cardiology - Denies CP, SOB, DOE, LE edema - Refills appear stable; pt will double check and will message/call office      Migraine without aura and without status migrainosus, not intractable    Infrequent migraines; encourage use of headache journal to assist with ID of triggers -increase water -focus on sleep -decrease ETOH -decrease processed foods/packaged fooods      Relevant Medications   butalbital-acetaminophen-caffeine (FIORICET) 50-325-40 MG tablet     Other   Hyperlipidemia    Chronic, stable without use of medication Continue to recommend use of statin to assist with reduction of risk recommend diet low in saturated fat and regular exercise - 30 min at least 5 times per week  The 10-year ASCVD risk score (Arnett DK, et al., 2019) is: 9.7%   Values used to calculate the score:     Age: 71 years     Sex: Male     Is Non-Hispanic African American: No     Diabetic: No     Tobacco smoker: No      Systolic Blood Pressure: 329 mmHg     Is BP treated: Yes     HDL Cholesterol: 32 mg/dL     Total Cholesterol: 223 mg/dL       Palpitations    - Chronic and well-controlled - Continue use of BB to assist- Metoprolol - Continue following with cardiology - Discussed other causes as well- dehydration, stress, nicotine, sleep disturbance, etc      Statin declined  Current risk >7.5% Likely higher given vaping hx Working on reduction of nicotine Encourage use of statin to offset risk of MI and/or CVA; education provided      Tobacco use    Has decreased his use of cigarettes; however, has increased vaping Recommend use of repeat dosing of chantix Reviewed quitline Referral to smoking cessation declined at this time- encouraged to provide grace in the relapse      Relevant Medications   varenicline (CHANTIX) 0.5 MG tablet   varenicline (CHANTIX CONTINUING MONTH PAK) 1 MG tablet     Return in about 6 months (around 06/09/2022) for annual examination.      Vonna Kotyk, FNP, have reviewed all documentation for this visit. The documentation on 12/10/21 for the exam, diagnosis, procedures, and orders are all accurate and complete.    Gwyneth Sprout, [place] [phone] (phone) [phone] (fax)  [place]

## 2021-12-10 NOTE — Assessment & Plan Note (Signed)
Current risk >7.5% Likely higher given vaping hx Working on reduction of nicotine Encourage use of statin to offset risk of MI and/or CVA; education provided

## 2021-12-10 NOTE — Assessment & Plan Note (Signed)
Has decreased his use of cigarettes; however, has increased vaping Recommend use of repeat dosing of chantix Reviewed quitline Referral to smoking cessation declined at this time- encouraged to provide grace in the relapse

## 2021-12-10 NOTE — Assessment & Plan Note (Signed)
-   Chronic and well-controlled - Continue use of BB to assist- Metoprolol - Continue following with cardiology - Discussed other causes as well- dehydration, stress, nicotine, sleep disturbance, etc

## 2021-12-10 NOTE — Assessment & Plan Note (Signed)
Chronic, stable without use of medication Continue to recommend use of statin to assist with reduction of risk recommend diet low in saturated fat and regular exercise - 30 min at least 5 times per week  The 10-year ASCVD risk score (Arnett DK, et al., 2019) is: 9.7%   Values used to calculate the score:     Age: 56 years     Sex: Male     Is Non-Hispanic African American: No     Diabetic: No     Tobacco smoker: No     Systolic Blood Pressure: 287 mmHg     Is BP treated: Yes     HDL Cholesterol: 32 mg/dL     Total Cholesterol: 223 mg/dL

## 2022-01-04 ENCOUNTER — Other Ambulatory Visit: Payer: Self-pay | Admitting: Family Medicine

## 2022-01-04 DIAGNOSIS — Z720 Tobacco use: Secondary | ICD-10-CM

## 2022-01-07 NOTE — Telephone Encounter (Signed)
Requested medications are due for refill today.  no ? ?Requested medications are on the active medications list.  yes ? ?Last refill. 12/10/2021 #60 0 refills ? ?Future visit scheduled.   yes ? ?Notes to clinic.  This refill request is for the "starter pack", not the continuing/ongoing medication.  ? ? ? ?Requested Prescriptions  ?Pending Prescriptions Disp Refills  ? varenicline (CHANTIX) 0.5 MG tablet [Pharmacy Med Name: VARENICLINE 0.5 MG TABLET] 60 tablet 0  ?  Sig: Take 0.5 mg PO once daily for 3 days, then increase to one 0.5 mg tablet twice daily for 4 days, then increase to 1 mg twice daily.  ?  ? Psychiatry:  Drug Dependence Therapy - varenicline Failed - 01/04/2022 12:30 PM  ?  ?  Failed - Manual Review: Do not refill starter pack. 1 mg tabs may be extended up to one year if the patient has quit smoking but still feels at risk for relapse.  ?  ?  Failed - Cr in normal range and within 180 days  ?  Creatinine  ?Date Value Ref Range Status  ?01/31/2015 1.18 mg/dL Final  ?  Comment:  ?  0.61-1.24 ?NOTE: New Reference Range ? 12/19/14 ?  ? ?Creatinine, Ser  ?Date Value Ref Range Status  ?05/23/2021 1.01 0.61 - 1.24 mg/dL Final  ?  ?  ?  ?  Passed - Completed PHQ-2 or PHQ-9 in the last 360 days  ?  ?  Passed - Valid encounter within last 6 months  ?  Recent Outpatient Visits   ? ?      ? 4 weeks ago HYPERTENSION, BENIGN  ? [facility] Gwyneth Sprout, FNP  ? 7 months ago HYPERTENSION, BENIGN  ? [facility] Bacigalupo, Dionne Bucy, MD  ? 1 year ago Annual physical exam  ? [facility] Carles Collet M, [place]  ? 1 year ago HYPERTENSION, BENIGN  ? [facility] [place], Wendee Beavers, [place]  ? 2 years ago Annual physical exam  ? [facility] Carles Collet M, [place]  ? ?  ?  ?Future Appointments   ? ?        ? In 5 months Gwyneth Sprout, [place], [place]  ? In 7 months McGowan, Gordan Payment [facility]  ? ?  ? ?  ?   ?  ?  ?

## 2022-01-29 ENCOUNTER — Other Ambulatory Visit: Payer: Self-pay | Admitting: Family Medicine

## 2022-01-29 DIAGNOSIS — Z720 Tobacco use: Secondary | ICD-10-CM

## 2022-01-29 NOTE — Telephone Encounter (Signed)
Requested medication (s) are due for refill today:   Yes ? ?Requested medication (s) are on the active medication list:   Yes ? ?Future visit scheduled:   Yes ? ? ?Last ordered: 12/18/2021 #60, 0 refills ? ?Returned because Cr is over 180 days per protocol.     ? ?Requested Prescriptions  ?Pending Prescriptions Disp Refills  ? varenicline (CHANTIX) 0.5 MG tablet [Pharmacy Med Name: VARENICLINE 0.5 MG TABLET] 60 tablet 0  ?  Sig: TAKE 0.5 MG PO ONCE DAILY FOR 3 DAYS, THEN INCREASE TO ONE 0.5 MG TABLET TWICE DAILY FOR 4 DAYS, THEN INCREASE TO 1 MG TWICE DAILY.  ?  ? Psychiatry:  Drug Dependence Therapy - varenicline Failed - 01/29/2022  9:31 AM  ?  ?  Failed - Manual Review: Do not refill starter pack. 1 mg tabs may be extended up to one year if the patient has quit smoking but still feels at risk for relapse.  ?  ?  Failed - Cr in normal range and within 180 days  ?  Creatinine  ?Date Value Ref Range Status  ?01/31/2015 1.18 mg/dL Final  ?  Comment:  ?  0.61-1.24 ?NOTE: New Reference Range ? 12/19/14 ?  ? ?Creatinine, Ser  ?Date Value Ref Range Status  ?05/23/2021 1.01 0.61 - 1.24 mg/dL Final  ?  ?  ?  ?  Passed - Completed PHQ-2 or PHQ-9 in the last 360 days  ?  ?  Passed - Valid encounter within last 6 months  ?  Recent Outpatient Visits   ? ?      ? 1 month ago HYPERTENSION, BENIGN  ? [facility] Taylor Sprout, FNP  ? 7 months ago HYPERTENSION, BENIGN  ? [facility] Bacigalupo, Dionne Bucy, MD  ? 1 year ago Annual physical exam  ? [facility] Carles Collet M, [place]  ? 1 year ago HYPERTENSION, BENIGN  ? [facility] [place], Wendee Beavers, [place]  ? 2 years ago Annual physical exam  ? [facility] Carles Collet M, [place]  ? ?  ?  ?Future Appointments   ? ?        ? In 4 months Taylor Medina, [place], [place]  ? In 7 months McGowan, Gordan Payment [facility]  ? ?  ? ? ?  ?  ?  ? ?

## 2022-02-28 ENCOUNTER — Other Ambulatory Visit: Payer: Self-pay | Admitting: Family Medicine

## 2022-02-28 DIAGNOSIS — Z720 Tobacco use: Secondary | ICD-10-CM

## 2022-02-28 NOTE — Telephone Encounter (Signed)
Requested medication (s) are due for refill today: yes  Requested medication (s) are on the active medication list: yes  Last refill:  01/31/22  Future visit scheduled: yes  Notes to clinic:  according to SIG is pt to be prescribed 1 mg instead of 0.5 mg ?   Requested Prescriptions  Pending Prescriptions Disp Refills   varenicline (CHANTIX) 0.5 MG tablet [Pharmacy Med Name: VARENICLINE 0.5 MG TABLET] 60 tablet 0    Sig: TAKE 0.5 MG PO ONCE DAILY FOR 3 DAYS, THEN INCREASE TO ONE 0.5 MG TABLET TWICE DAILY FOR 4 DAYS, THEN INCREASE TO 1 MG TWICE DAILY.     Psychiatry:  Drug Dependence Therapy - varenicline Failed - 02/28/2022  1:35 PM      Failed - Manual Review: Do not refill starter pack. 1 mg tabs may be extended up to one year if the patient has quit smoking but still feels at risk for relapse.      Failed - Cr in normal range and within 180 days    Creatinine  Date Value Ref Range Status  01/31/2015 1.18 mg/dL Final    Comment:    0.61-1.24 NOTE: New Reference Range  12/19/14    Creatinine, Ser  Date Value Ref Range Status  05/23/2021 1.01 0.61 - 1.24 mg/dL Final         Passed - Completed PHQ-2 or PHQ-9 in the last 360 days      Passed - Valid encounter within last 6 months    Recent Outpatient Visits           2 months ago HYPERTENSION, [place] Gwyneth Sprout, FNP   8 months ago HYPERTENSION, [place], Dionne Bucy, MD   1 year ago Annual physical exam   [facility] Trinna Post, [place]   1 year ago HYPERTENSION, [place] Trinna Post, [place]   2 years ago Annual physical exam   [place], Wendee Beavers, [place]       Future Appointments             In 3 months Gwyneth Sprout, [place], [place]   In 6 months McGowan, Gordan Payment [facility]

## 2022-03-03 DIAGNOSIS — M25561 Pain in right knee: Secondary | ICD-10-CM | POA: Diagnosis not present

## 2022-04-02 ENCOUNTER — Other Ambulatory Visit: Payer: Self-pay | Admitting: Family Medicine

## 2022-04-02 DIAGNOSIS — Z720 Tobacco use: Secondary | ICD-10-CM

## 2022-04-02 NOTE — Telephone Encounter (Signed)
Requested medication (s) are due for refill today: yyes  Requested medication (s) are on the active medication list: yes  Last refill:  03/04/22 #60 with 0 RF  Future visit scheduled: 06/10/22  Notes to clinic:  Wondered if would want to change signature/instructions on rx prior to filling, please assess.      Requested Prescriptions  Pending Prescriptions Disp Refills   varenicline (CHANTIX) 0.5 MG tablet [Pharmacy Med Name: VARENICLINE 0.5 MG TABLET] 56 tablet     Sig: TAKE 0.5 MG PO ONCE DAILY FOR 3 DAYS, THEN INCREASE TO ONE 0.5 MG TABLET TWICE DAILY FOR 4 DAYS, THEN INCREASE TO 1 MG TWICE DAILY.     Psychiatry:  Drug Dependence Therapy - varenicline Failed - 04/02/2022 11:34 AM      Failed - Manual Review: Do not refill starter pack. 1 mg tabs may be extended up to one year if the patient has quit smoking but still feels at risk for relapse.      Failed - Cr in normal range and within 180 days    Creatinine  Date Value Ref Range Status  01/31/2015 1.18 mg/dL Final    Comment:    0.61-1.24 NOTE: New Reference Range  12/19/14    Creatinine, Ser  Date Value Ref Range Status  05/23/2021 1.01 0.61 - 1.24 mg/dL Final         Passed - Completed PHQ-2 or PHQ-9 in the last 360 days      Passed - Valid encounter within last 6 months    Recent Outpatient Visits           3 months ago HYPERTENSION, [place] Gwyneth Sprout, FNP   9 months ago HYPERTENSION, [place], Dionne Bucy, MD   1 year ago Annual physical exam   [facility] Trinna Post, [place]   1 year ago HYPERTENSION, [place] Trinna Post, [place]   2 years ago Annual physical exam   [facility], Wendee Beavers, [place]       Future Appointments             In 2 months Gwyneth Sprout, [place], [place]   In 4 months McGowan, Gordan Payment [facility]

## 2022-05-01 ENCOUNTER — Other Ambulatory Visit: Payer: Self-pay | Admitting: Family Medicine

## 2022-05-01 DIAGNOSIS — Z720 Tobacco use: Secondary | ICD-10-CM

## 2022-05-23 ENCOUNTER — Other Ambulatory Visit: Payer: Self-pay | Admitting: Family Medicine

## 2022-05-23 DIAGNOSIS — I10 Essential (primary) hypertension: Secondary | ICD-10-CM

## 2022-05-23 NOTE — Telephone Encounter (Signed)
Requested medication (s) are due for refill today: yes  Requested medication (s) are on the active medication list: yes    Last refill: 07/04/21 #90  1 refill  Future visit scheduled yes 06/10/22  Notes to clinic:Failed due to labs, please review. Thank you.  Requested Prescriptions  Pending Prescriptions Disp Refills   lisinopril-hydrochlorothiazide (ZESTORETIC) 10-12.5 MG tablet [Pharmacy Med Name: LISINOPRIL-HCTZ 10-12.5 MG TAB] 90 tablet 1    Sig: TAKE 1 TABLET BY MOUTH EVERY DAY     Cardiovascular:  ACEI + Diuretic Combos Failed - 05/23/2022  2:29 AM      Failed - Na in normal range and within 180 days    Sodium  Date Value Ref Range Status  05/23/2021 137 135 - 145 mmol/L Final  12/10/2020 138 134 - 144 mmol/L Final  01/31/2015 137 mmol/L Final    Comment:    135-145 NOTE: New Reference Range  12/19/14          Failed - K in normal range and within 180 days    Potassium  Date Value Ref Range Status  05/23/2021 4.6 3.5 - 5.1 mmol/L Final  01/31/2015 4.5 mmol/L Final    Comment:    3.5-5.1 NOTE: New Reference Range  12/19/14          Failed - Cr in normal range and within 180 days    Creatinine  Date Value Ref Range Status  01/31/2015 1.18 mg/dL Final    Comment:    0.61-1.24 NOTE: New Reference Range  12/19/14    Creatinine, Ser  Date Value Ref Range Status  05/23/2021 1.01 0.61 - 1.24 mg/dL Final         Failed - eGFR is 30 or above and within 180 days    EGFR (African American)  Date Value Ref Range Status  01/31/2015 >60  Final   GFR calc Af Amer  Date Value Ref Range Status  12/06/2020 106 >59 mL/min/1.73 Final    Comment:    **In accordance with recommendations from the NKF-ASN Task force,**   [facility] is in the process of updating its eGFR calculation to the   2021 CKD-EPI creatinine equation that estimates kidney function   without a race variable.    EGFR (Non-African Amer.)  Date Value Ref Range Status  01/31/2015 >60  Final     Comment:    eGFR values <60mL/min/1.73 m2 may be an indication of chronic kidney disease (CKD). Calculated eGFR is useful in patients with stable renal function. The eGFR calculation will not be reliable in acutely ill patients when serum creatinine is changing rapidly. It is not useful in patients on dialysis. The eGFR calculation may not be applicable to patients at the low and high extremes of body sizes, pregnant women, and vegetarians.    GFR, Estimated  Date Value Ref Range Status  05/23/2021 >60 >60 mL/min Final    Comment:    (NOTE) Calculated using the CKD-EPI Creatinine Equation (2021)    eGFR  Date Value Ref Range Status  12/10/2020 100 >59 mL/min/1.73 Final    Comment:    **In accordance with recommendations from the NKF-ASN Task force,**   [facility] has updated its eGFR calculation to the 2021 CKD-EPI   creatinine equation that estimates kidney function without a race   variable.          Passed - Patient is not pregnant      Passed - Last BP in normal range    BP Readings from   Last 1 Encounters:  12/10/21 130/70         Passed - Valid encounter within last 6 months    Recent Outpatient Visits           5 months ago HYPERTENSION, BENIGN   [facility] Payne, Elise T, FNP   11 months ago HYPERTENSION, BENIGN   [facility] Bacigalupo, Angela M, MD   1 year ago Annual physical exam   [facility] Pollak, Adriana M, PA-C   1 year ago HYPERTENSION, BENIGN   [facility] Pollak, Adriana M, PA-C   2 years ago Annual physical exam   [facility] Pollak, Adriana M, PA-C       Future Appointments             In 2 weeks Payne, Elise T, FNP [facility]   In 3 months McGowan, Shannon A, PA-C [facility]                

## 2022-06-10 ENCOUNTER — Encounter: Payer: BC Managed Care – PPO | Admitting: Family Medicine

## 2022-07-02 NOTE — Progress Notes (Unsigned)
Complete physical exam   Patient: Taylor Medina   DOB: [birth year]   56 y.o. Male  MRN: [id number] Visit Date: 07/03/2022  Today's healthcare provider: Gwyneth Sprout, FNP   No chief complaint on file.  Subjective    [name] is a 56 y.o. male who presents today for a complete physical exam.  He reports consuming a {diet types:17450} diet. {Exercise:19826} He generally feels {well/fairly well/poorly:18703}. He reports sleeping {well/fairly well/poorly:18703}. He {does/does not:200015} have additional problems to discuss today.  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  Tib/fib fracture   hardware fractions     on R; several surgeries    hernia repair surgery     Social History   Socioeconomic History   Marital status: Married    Spouse name: Not on file   Number of children: Not on file   Years of education: Not on file   Highest education level: Not on file  Occupational History   Not on file  Tobacco Use   Smoking status: Former    Packs/day: 0.00    Years: 6.00    Total pack years: 0.00     Types: Cigarettes    Quit date: 07/28/2013    Years since quitting: 8.9   Smokeless tobacco: Never   Tobacco comments:    smoked for approx 12 yrs. quit for 3. Restarted several months ago            STOPPED 2 WEEKS AGO  Vaping Use   Vaping Use: Some days  Substance and Sexual Activity   Alcohol use: Yes    Alcohol/week: 5.0 standard drinks of alcohol    Types: 5 Glasses of wine per week   Drug use: No   Sexual activity: Not on file  Other Topics Concern   Not on file  Social History Narrative   Single; full time; does not get regular exercise.    Social Determinants of Health   Financial Resource Strain: Not on file  Food Insecurity: Not on file  Transportation Needs: Not on file  Physical Activity: Not on file  Stress: Not on file  Social Connections: Not on file  Intimate Partner Violence: Not on file   Family Status  Relation Name Status   Mother  Deceased   Father  Deceased   Other  (Not Specified)   Other  (Not Specified)   Other  (Not Specified)   Brother  (Not Specified)   Family History  Problem Relation Age of Onset   Lung cancer Mother    Lung cancer  Father    Cancer Other        family hx   Coronary artery disease Other        family hx   Diabetes Other        family hx   Colon cancer Brother    Allergies  Allergen Reactions   Influenza Vaccines     Guillain barre syndrome    Patient Care Team: Gwyneth Sprout, FNP as PCP - General (Family Medicine)   Medications: Outpatient Medications Prior to Visit  Medication Sig   butalbital-acetaminophen-caffeine (FIORICET) 50-325-40 MG tablet Take 1 tablet by mouth every 6 (six) hours as needed for headache.   chlorhexidine (PERIDEX) 0.12 % solution 2 (two) times daily.   DENTA 5000 PLUS 1.1 % CREA dental cream Take by mouth at bedtime.   ibuprofen (ADVIL) 800 MG tablet Take 800 mg by mouth every 8 (eight) hours as needed.   lisinopril-hydrochlorothiazide (ZESTORETIC) 10-12.5 MG tablet TAKE 1  TABLET BY MOUTH EVERY DAY   metoprolol succinate (TOPROL-XL) 25 MG 24 hr tablet Take 1 tablet by mouth daily.   tadalafil (CIALIS) 20 MG tablet Take 1 tablet (20 mg total) by mouth daily as needed for erectile dysfunction.   tadalafil (CIALIS) 5 MG tablet Take 1 tablet (5 mg total) by mouth daily as needed for erectile dysfunction.   varenicline (CHANTIX CONTINUING MONTH PAK) 1 MG tablet Take 1 tablet (1 mg total) by mouth 2 (two) times daily.   varenicline (CHANTIX) 0.5 MG tablet TAKE 0.5 MG ONCE DAILY FOR 3 DAYS, THEN INCREASE TO ONE 0.5 MG TABLET TWICE DAILY FOR 4 DAYS, THEN INCREASE TO 1 MG TWICE DAILY.   No facility-administered medications prior to visit.    Review of Systems  {Labs  Heme  Chem  Endocrine  Serology  Results Review (optional):23779}  Objective    There were no vitals taken for this visit. {Show previous vital signs (optional):23777}   Physical Exam  ***  Last depression screening scores    12/10/2021    3:58 PM 06/10/2021    4:20 PM 12/10/2020    3:26 PM  PHQ 2/9 Scores  PHQ - 2 Score 0 0 0  PHQ- 9 Score  0 0   Last fall risk screening    12/10/2021    3:53 PM  [place] in the past year? 0  Number falls in past yr: 0  Injury with Fall? 0   Last Audit-C alcohol use screening    06/10/2021    4:18 PM  Alcohol Use Disorder Test (AUDIT)  1. How often do you have a drink containing alcohol? 3  2. How many drinks containing alcohol do you have on a typical day when you are drinking? 0  3. How often do you have six or more drinks on one occasion? 1  AUDIT-C Score 4   A score of 3 or more in women, and 4 or more in men indicates increased risk for alcohol abuse, EXCEPT if all of the points are from question 1   No results found for any visits on 07/03/22.  Assessment & Plan    Routine Health Maintenance and Physical Exam  Exercise Activities and Dietary recommendations  Goals   None     Immunization History  Administered Date(s)  Administered   Influenza-Unspecified 05/19/2019   Janssen (J&J) SARS-COV-2 Vaccination 08/17/2020   Tdap 03/27/2019   Zoster Recombinat (Shingrix) 03/18/2021, 02/26/2022    Health Maintenance  Topic Date  Due   COVID-19 Vaccine (2 - Booster for Janssen series) 10/12/2020   COLONOSCOPY (Pts 45-44yr Insurance coverage will need to be confirmed)  12/11/2027   TETANUS/TDAP  03/26/2029   Hepatitis C Screening  Completed   HIV Screening  Completed   Zoster Vaccines- Shingrix  Completed   HPV VACCINES  Aged Out    Discussed health benefits of physical activity, and encouraged him to engage in regular exercise appropriate for his age and condition.  ***  No follow-ups on file.     {provider attestation***:1}   EGwyneth Sprout [phone](phone) [phone](fax)  [place]

## 2022-07-03 ENCOUNTER — Encounter: Payer: Self-pay | Admitting: Family Medicine

## 2022-07-03 ENCOUNTER — Ambulatory Visit (INDEPENDENT_AMBULATORY_CARE_PROVIDER_SITE_OTHER): Payer: BC Managed Care – PPO | Admitting: Family Medicine

## 2022-07-03 VITALS — BP 148/101 | HR 79 | Resp 16 | Ht 69.0 in | Wt 168.0 lb

## 2022-07-03 DIAGNOSIS — G43009 Migraine without aura, not intractable, without status migrainosus: Secondary | ICD-10-CM

## 2022-07-03 DIAGNOSIS — Z125 Encounter for screening for malignant neoplasm of prostate: Secondary | ICD-10-CM | POA: Diagnosis not present

## 2022-07-03 DIAGNOSIS — Z0000 Encounter for general adult medical examination without abnormal findings: Secondary | ICD-10-CM | POA: Diagnosis not present

## 2022-07-03 DIAGNOSIS — I10 Essential (primary) hypertension: Secondary | ICD-10-CM

## 2022-07-03 MED ORDER — BUTALBITAL-APAP-CAFFEINE 50-325-40 MG PO TABS: 1.0000 | ORAL_TABLET | Freq: Four times a day (QID) | ORAL | 0 refills | Status: DC | PRN

## 2022-07-03 MED ORDER — LISINOPRIL-HYDROCHLOROTHIAZIDE 10-12.5 MG PO TABS: 2.0000 | ORAL_TABLET | Freq: Every day | ORAL | 0 refills | Status: DC

## 2022-07-03 NOTE — Assessment & Plan Note (Signed)
Concern for dental abscess; has seen dentist but not oral surgeon Denies visual complaints Things to do to keep yourself healthy  - Exercise at least 30-45 minutes a day, 3-4 days a week.  - Eat a low-fat diet with lots of fruits and vegetables, up to 7-9 servings per day.  - Seatbelts can save your life. Wear them always.  - Smoke detectors on every level of your home, check batteries every year.  - Eye Doctor - have an eye exam every 1-2 years  - Safe sex - if you may be exposed to STDs, use a condom.  - Alcohol -  If you drink, do it moderately, less than 2 drinks per day.  - [place]. Choose someone to speak for you if you are not able.  - Depression is common in our stressful world.If you're feeling down or losing interest in things you normally enjoy, please come in for a visit.  - Violence - If anyone is threatening or hurting you, please call immediately.

## 2022-07-03 NOTE — Assessment & Plan Note (Signed)
Chronic, elevated Denies CP Denies SOB/ DOE Denies low blood pressure/hypotension Denies vision changes No LE Edema noted on exam Continue medication, INCREASE to 2 tablets with 3 week f/u Denies side effects Seek emergent care if you develop chest pain or chest pressure

## 2022-07-03 NOTE — Assessment & Plan Note (Signed)
Defer DRE; check PSA Denies complaints

## 2022-07-03 NOTE — Assessment & Plan Note (Signed)
Chronic, stable Request for refill today of PRN medication

## 2022-07-04 ENCOUNTER — Other Ambulatory Visit: Payer: Self-pay | Admitting: Family Medicine

## 2022-07-04 LAB — COMPREHENSIVE METABOLIC PANEL
ALT: 33 IU/L (ref 0–44)
AST: 22 IU/L (ref 0–40)
Albumin/Globulin Ratio: 1.7 (ref 1.2–2.2)
Albumin: 4.5 g/dL (ref 3.8–4.9)
Alkaline Phosphatase: 86 IU/L (ref 44–121)
BUN/Creatinine Ratio: 22 — ABNORMAL HIGH (ref 9–20)
BUN: 21 mg/dL (ref 6–24)
Bilirubin Total: 0.4 mg/dL (ref 0.0–1.2)
CO2: 20 mmol/L (ref 20–29)
Calcium: 10 mg/dL (ref 8.7–10.2)
Chloride: 102 mmol/L (ref 96–106)
Creatinine, Ser: 0.97 mg/dL (ref 0.76–1.27)
Globulin, Total: 2.7 g/dL (ref 1.5–4.5)
Glucose: 94 mg/dL (ref 70–99)
Potassium: 4.5 mmol/L (ref 3.5–5.2)
Sodium: 138 mmol/L (ref 134–144)
Total Protein: 7.2 g/dL (ref 6.0–8.5)
eGFR: 93 mL/min/{1.73_m2} (ref >59)

## 2022-07-04 LAB — TSH+FREE T4
Free T4: 0.79 ng/dL — ABNORMAL LOW (ref 0.82–1.77)
TSH: 3.9 u[IU]/mL (ref 0.450–4.500)

## 2022-07-04 LAB — CBC WITH DIFFERENTIAL/PLATELET
Basophils Absolute: 0 10*3/uL (ref 0.0–0.2)
Basos: 0 %
EOS (ABSOLUTE): 0.1 10*3/uL (ref 0.0–0.4)
Eos: 1 %
Hematocrit: 56.1 % — ABNORMAL HIGH (ref 37.5–51.0)
Hemoglobin: 19.3 g/dL — ABNORMAL HIGH (ref 13.0–17.7)
Immature Grans (Abs): 0 10*3/uL (ref 0.0–0.1)
Immature Granulocytes: 0 %
Lymphocytes Absolute: 1.8 10*3/uL (ref 0.7–3.1)
Lymphs: 20 %
MCH: 31.8 pg (ref 26.6–33.0)
MCHC: 34.4 g/dL (ref 31.5–35.7)
MCV: 93 fL (ref 79–97)
Monocytes Absolute: 0.9 10*3/uL (ref 0.1–0.9)
Monocytes: 10 %
Neutrophils Absolute: 6.2 10*3/uL (ref 1.4–7.0)
Neutrophils: 69 %
Platelets: 195 10*3/uL (ref 150–450)
RBC: 6.06 x10E6/uL — ABNORMAL HIGH (ref 4.14–5.80)
RDW: 13.5 % (ref 11.6–15.4)
WBC: 9.1 10*3/uL (ref 3.4–10.8)

## 2022-07-04 LAB — LIPID PANEL
Chol/HDL Ratio: 4.7 ratio (ref 0.0–5.0)
Cholesterol, Total: 208 mg/dL — ABNORMAL HIGH (ref 100–199)
HDL: 44 mg/dL (ref >39)
LDL Chol Calc (NIH): 89 mg/dL (ref 0–99)
Triglycerides: 458 mg/dL — ABNORMAL HIGH (ref 0–149)
VLDL Cholesterol Cal: 75 mg/dL — ABNORMAL HIGH (ref 5–40)

## 2022-07-04 LAB — PSA: Prostate Specific Ag, Serum: 0.4 ng/mL (ref 0.0–4.0)

## 2022-07-04 MED ORDER — FENOFIBRATE 145 MG PO TABS: 145.0000 mg | ORAL_TABLET | Freq: Every day | ORAL | 1 refills | Status: DC

## 2022-07-04 NOTE — Progress Notes (Signed)
Cholesterol remains elevated above 200; however, LDL/bad cholesterol is now normal. Fats/triglycerides remain elevated. The 10-year ASCVD risk score (Arnett DK, et al., 2019) is: 16.9%   Values used to calculate the score:     Age: 56 years     Sex: Male     Is Non-Hispanic African American: No     Diabetic: No     Tobacco smoker: Yes     Systolic Blood Pressure: 518 mmHg     Is BP treated: Yes     HDL Cholesterol: 44 mg/dL     Total Cholesterol: 208 mg/dL Heart attack and stroke risk is 17% estimated within the next 10 years which is elevated. Recommend use of anti-lipid medication to assist such as fenofibrate. Please let us know if you are willing to try/use this medication.   All other labs are normal/stable.  Gwyneth Sprout, [address] [place], [place] [address] [phone] (phone) [phone] (fax) [place]

## 2022-07-24 ENCOUNTER — Ambulatory Visit: Payer: BC Managed Care – PPO | Admitting: Family Medicine

## 2022-07-24 ENCOUNTER — Encounter: Payer: Self-pay | Admitting: Family Medicine

## 2022-07-24 DIAGNOSIS — I10 Essential (primary) hypertension: Secondary | ICD-10-CM | POA: Diagnosis not present

## 2022-07-24 MED ORDER — LISINOPRIL-HYDROCHLOROTHIAZIDE 20-25 MG PO TABS: 1.0000 | ORAL_TABLET | Freq: Every day | ORAL | 3 refills | Status: DC

## 2022-07-24 MED ORDER — METOPROLOL SUCCINATE ER 25 MG PO TB24: 25.0000 mg | ORAL_TABLET | Freq: Every day | ORAL | 3 refills | Status: DC

## 2022-07-24 NOTE — Progress Notes (Signed)
Established patient visit   Patient: Taylor Medina   DOB: May 08, 1966   56 y.o. Male  MRN: [id number] Visit Date: 07/24/2022  Today's healthcare provider: Gwyneth Sprout, FNP   Introduced to nurse practitioner role and practice setting.  All questions answered.  Discussed provider/patient relationship and expectations.  I,Tiffany J Bragg,acting as a scribe for Gwyneth Sprout, FNP.,have documented all relevant documentation on the behalf of Gwyneth Sprout, FNP,as directed by  Gwyneth Sprout, FNP while in the presence of Gwyneth Sprout, FNP.   Chief Complaint  Patient presents with   Hypertension   Subjective    HPI  Hypertension, follow-up  BP Readings from Last 3 Encounters:  07/24/22 138/84  07/03/22 (!) 148/101  12/10/21 130/70   Wt Readings from Last 3 Encounters:  07/24/22 171 lb (77.6 kg)  07/03/22 168 lb (76.2 kg)  12/10/21 175 lb 1.6 oz (79.4 kg)     He was last seen for hypertension 3 weeks ago.  BP at that visit was 148/101. Management since that visit includes increase zestoretic.  He reports excellent compliance with treatment. He is not having side effects.  He is following a Regular diet. He is exercising. He does smoke.  Use of agents associated with hypertension: none.   Outside blood pressures are not checked. Symptoms: No chest pain No chest pressure  No palpitations No syncope  No dyspnea No orthopnea  No paroxysmal nocturnal dyspnea No lower extremity edema   Pertinent labs Lab Results  Component Value Date   CHOL 208 (H) 07/03/2022   HDL 44 07/03/2022   LDLCALC 89 07/03/2022   LDLDIRECT 99 04/07/2019   TRIG 458 (H) 07/03/2022   CHOLHDL 4.7 07/03/2022   Lab Results  Component Value Date   NA 138 07/03/2022   K 4.5 07/03/2022   CREATININE 0.97 07/03/2022   EGFR 93 07/03/2022   GLUCOSE 94 07/03/2022   TSH 3.900 07/03/2022     The 10-year ASCVD risk score (Arnett DK, et al., 2019) is:  15.1%  ---------------------------------------------------------------------------------------------------   Medications: Outpatient Medications Prior to Visit  Medication Sig   butalbital-acetaminophen-caffeine (FIORICET) 50-325-40 MG tablet Take 1 tablet by mouth every 6 (six) hours as needed for headache.   chlorhexidine (PERIDEX) 0.12 % solution 2 (two) times daily.   DENTA 5000 PLUS 1.1 % CREA dental cream Take by mouth at bedtime.   fenofibrate (TRICOR) 145 MG tablet Take 1 tablet (145 mg total) by mouth daily.   ibuprofen (ADVIL) 800 MG tablet Take 800 mg by mouth every 8 (eight) hours as needed.   tadalafil (CIALIS) 20 MG tablet Take 1 tablet (20 mg total) by mouth daily as needed for erectile dysfunction.   tadalafil (CIALIS) 5 MG tablet Take 1 tablet (5 mg total) by mouth daily as needed for erectile dysfunction.   [DISCONTINUED] lisinopril-hydrochlorothiazide (ZESTORETIC) 10-12.5 MG tablet Take 2 tablets by mouth daily.   [DISCONTINUED] metoprolol succinate (TOPROL-XL) 25 MG 24 hr tablet Take 1 tablet by mouth daily.   No facility-administered medications prior to visit.    Review of Systems     Objective    BP 138/84 (BP Location: Left Arm, Patient Position: Sitting, Cuff Size: Normal)   Pulse 74   Resp 17   Ht $R'5\' 9"'aU$  (1.753 m)   Wt 171 lb (77.6 kg)   SpO2 99%   BMI 25.25 kg/m    Physical Exam Vitals and nursing note reviewed.  Constitutional:  Appearance: Normal appearance. He is overweight.  HENT:     Head: Normocephalic and atraumatic.  Eyes:     Pupils: Pupils are equal, round, and reactive to light.  Cardiovascular:     Rate and Rhythm: Normal rate and regular rhythm.     Pulses: Normal pulses.     Heart sounds: Normal heart sounds.  Pulmonary:     Effort: Pulmonary effort is normal.     Breath sounds: Normal breath sounds.  Musculoskeletal:        General: Normal range of motion.     Cervical back: Normal range of motion.  Skin:    General:  Skin is warm and dry.     Capillary Refill: Capillary refill takes less than 2 seconds.  Neurological:     General: No focal deficit present.     Mental Status: He is alert and oriented to person, place, and time. Mental status is at baseline.  Psychiatric:        Mood and Affect: Mood normal.        Behavior: Behavior normal.        Thought Content: Thought content normal.        Judgment: Judgment normal.     No results found for any visits on 07/24/22.  Assessment & Plan     Problem List Items Addressed This Visit       Cardiovascular and Mediastinum   HYPERTENSION, BENIGN    Chronic, stable Continue both toprol 25 and zestoretic 20-25 Labs completed at CPE recently Pt denies SI or complaints No edema noted Encouraged to stop smoking; has quit before; remains precontemplative       Relevant Medications   lisinopril-hydrochlorothiazide (ZESTORETIC) 20-25 MG tablet   metoprolol succinate (TOPROL-XL) 25 MG 24 hr tablet   Return in about 6 months (around 01/23/2023) for chonic disease management.     Vonna Kotyk, FNP, have reviewed all documentation for this visit. The documentation on 07/24/22 for the exam, diagnosis, procedures, and orders are all accurate and complete.  Gwyneth Sprout, [place] [phone] (phone) [phone] (fax)  [place]

## 2022-07-24 NOTE — Assessment & Plan Note (Signed)
Chronic, stable Continue both toprol 25 and zestoretic 20-25 Labs completed at CPE recently Pt denies SI or complaints No edema noted Encouraged to stop smoking; has quit before; remains precontemplative

## 2022-08-19 ENCOUNTER — Other Ambulatory Visit: Payer: Self-pay | Admitting: *Deleted

## 2022-08-19 DIAGNOSIS — N138 Other obstructive and reflux uropathy: Secondary | ICD-10-CM

## 2022-08-28 ENCOUNTER — Other Ambulatory Visit: Payer: BC Managed Care – PPO

## 2022-08-28 DIAGNOSIS — N401 Enlarged prostate with lower urinary tract symptoms: Secondary | ICD-10-CM

## 2022-08-28 DIAGNOSIS — N138 Other obstructive and reflux uropathy: Secondary | ICD-10-CM | POA: Diagnosis not present

## 2022-08-28 NOTE — Progress Notes (Addendum)
08/29/2022 9:22 AM   Taylor Medina [birth year] [id number]  Referring provider: Gwyneth Sprout, [address] [place],  [place] [address]  Urological history: 1. ED -contributing factors of age, BPH, HTN, HLD and sleep apnea -SHIM 20 -managed with tadalafil 20 mg, on-demand-dosing  2. BPH with LU TS -PSA (08/2022) 0.3 -PSA (06/2022) 0.4 -I PSS 10/1  HPI: Taylor Medina is a 56 y.o. male who presents today for yearly visit.    Last year, he was still having spontaneous erections without curvature or pain.  He was having postvoid dribbling with a moderate urinary stream.  He was started on tadalafil 5 mg daily in addition to the tadalafil 20 mg on-demand dosing.  There is a maternal past medical history of breast and ovarian cancer.  Patient still having spontaneous erections.  He denies any pain or curvature with erections.  He is having success with the tadalafil 20 mg, on-demand-dosing    SHIM     Row Name 08/29/22 0852         SHIM: Over the last 6 months:   How do you rate your confidence that you could get and keep an erection? High     When you had erections with sexual stimulation, how often were your erections hard enough for penetration (entering your partner)? Most Times (much more than half the time)     During sexual intercourse, how often were you able to maintain your erection after you had penetrated (entered) your partner? Most Times (much more than half the time)     During sexual intercourse, how difficult was it to maintain your erection to completion of intercourse? Slightly Difficult     When you attempted sexual intercourse, how often was it satisfactory for you? Most Times (much more than half the time)       SHIM Total Score   SHIM 20                Score: 1-7 Severe ED 8-11 Moderate ED 12-16 Mild-Moderate ED 17-21 Mild ED 22-25 No ED    He is having post-void dribbling.  He is taking the tadalafil 5 mg on occasion.  Patient  denies any modifying or aggravating factors.  Patient denies any gross hematuria, dysuria or suprapubic/flank pain.  Patient denies any fevers, chills, nausea or vomiting.     IPSS     Row Name 08/29/22 0800         International Prostate Symptom Score   How often have you had the sensation of not emptying your bladder? Less than 1 in 5     How often have you had to urinate less than every two hours? Less than 1 in 5 times     How often have you found you stopped and started again several times when you urinated? Less than 1 in 5 times     How often have you found it difficult to postpone urination? About half the time     How often have you had a weak urinary stream? About half the time     How often have you had to strain to start urination? Not at All     How many times did you typically get up at night to urinate? 1 Time     Total IPSS Score 10       Quality of Life due to urinary symptoms   If you were to spend the rest of your life with your urinary condition  just the way it is now how would you feel about that? 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  Tib/fib fracture   hardware fractions     on R; several surgeries    hernia repair surgery      Home Medications:  Allergies as of 08/29/2022       Reactions   Influenza Vaccines    Guillain barre syndrome        Medication List        Accurate as of August 29, 2022  9:22 AM. If you have any questions, ask your nurse or doctor.          butalbital-acetaminophen-caffeine 50-325-40 MG tablet Commonly known as: FIORICET Take 1 tablet by mouth every 6 (six) hours as needed for headache.   chlorhexidine 0.12 % solution Commonly known as: PERIDEX 2 (two) times daily.   Denta 5000 Plus 1.1 % Crea dental cream Generic drug: sodium fluoride Take by mouth at bedtime.   fenofibrate 145 MG tablet Commonly known as: Tricor Take 1 tablet (145 mg total) by mouth daily.   ibuprofen 800 MG tablet Commonly known as: ADVIL Take 800 mg by mouth every 8 (eight) hours as needed.   lisinopril-hydrochlorothiazide 20-25 MG tablet Commonly known as: ZESTORETIC Take 1 tablet by mouth daily.   metoprolol succinate 25 MG 24 hr tablet Commonly known as: TOPROL-XL Take 1 tablet (25 mg total) by mouth daily.   tadalafil 5 MG tablet Commonly known as: CIALIS Take 1 tablet (5 mg total) by mouth daily as needed for erectile dysfunction.   tadalafil 20 MG tablet Commonly known as: CIALIS Take 1 tablet (20 mg total) by mouth daily as needed for erectile dysfunction.        Allergies:  Allergies  Allergen Reactions   Influenza Vaccines     Guillain barre syndrome    Family History: Family History  Problem Relation Age of Onset   Lung cancer Mother    Lung cancer Father    Cancer Other        family hx   Coronary artery disease Other        family hx   Diabetes Other        family hx   Colon cancer Brother     Social  History:  reports that he has been smoking cigarettes. He started smoking about 36 years ago. He has been smoking an average of .5  packs per day. He has never used smokeless tobacco. He reports current alcohol use of about 5.0 standard drinks of alcohol per week. He reports that he does not use drugs.  ROS: For pertinent review of systems please refer to history of present illness  Physical Exam: BP 119/81   Pulse 91   Ht '5\' 9"'$  (1.753 m)   Wt 167 lb (75.8 kg)   BMI 24.66 kg/m   Constitutional:  Well nourished. Alert and oriented, No acute distress. HEENT: [place] AT, moist mucus membranes.  Trachea midline Cardiovascular: No clubbing, cyanosis, or edema. Respiratory: Normal respiratory effort, no increased work of breathing. GU: No CVA tenderness.  No bladder fullness or masses.  Patient with circumcised phallus. Urethral meatus is patent.  No penile discharge. No penile lesions or rashes. Scrotum without lesions, cysts, rashes and/or edema.  Testicles are located scrotally bilaterally. No masses are appreciated in the testicles. Left and right epididymis are normal. Rectal: Patient with  normal sphincter tone. Anus and perineum without scarring or rashes. No rectal masses are appreciated. Prostate is approximately 45 grams, no nodules are appreciated. Seminal vesicles could not be palpated.   Neurologic: Grossly intact, no focal deficits, moving all 4 extremities. Psychiatric: Normal mood and affect.   Laboratory Data: Component     Latest Ref Rng 07/03/2022  Cholesterol, Total     100 - 199 mg/dL 208 (H)   Triglycerides     0 - 149 mg/dL 458 (H)   HDL Cholesterol     >39 mg/dL 44   VLDL Cholesterol Cal     5 - 40 mg/dL 75 (H)   Total CHOL/HDL Ratio     0.0 - 5.0 ratio 4.7   LDL Chol Calc (NIH)     0 - 99 mg/dL 89     Legend: (H) High  Component     Latest Ref Rng 07/03/2022  TSH     0.450 - 4.500 uIU/mL 3.900   T4,Free(Direct)     0.82 - 1.77 ng/dL 0.79 (L)     Legend: (L)  Low     Latest Ref Rng & Units 07/03/2022    4:18 PM 06/04/2021    2:19 PM 05/23/2021   10:17 AM  CBC  WBC 3.4 - 10.8 x10E3/uL 9.1   9.9   Hemoglobin 13.0 - 17.7 g/dL 19.3  16.3  17.1   Hematocrit 37.5 - 51.0 % 56.1  47.1  49.0   Platelets 150 - 450 x10E3/uL 195   213        Latest Ref Rng & Units 07/03/2022    4:18 PM 05/23/2021   10:17 AM 05/09/2021    2:13 PM  CMP  Glucose 70 - 99 mg/dL 94  114  109   BUN 6 - 24 mg/dL '21  12  21   '$ Creatinine 0.76 - 1.27 mg/dL 0.97  1.01  1.15   Sodium 134 - 144 mmol/L 138  137  133   Potassium 3.5 - 5.2 mmol/L 4.5  4.6  4.3   Chloride 96 - 106 mmol/L 102  106  102   CO2 20 - 29 mmol/L '20  25  22   '$ Calcium 8.7 - 10.2 mg/dL 10.0  9.5  9.4   Total Protein 6.0 - 8.5 g/dL 7.2  7.5  7.3   Total Bilirubin 0.0 - 1.2 mg/dL 0.4  0.8  0.5   Alkaline Phos 44 - 121 IU/L 86  59  60   AST 0 -  40 IU/L 22  26  43   ALT 0 - 44 IU/L 33  46  73   I have reviewed the labs.  Pertinent Imaging N/A  Assessment & Plan:    1. Erectile dysfunction -at goal  -tadalafil 20 mg  2. BPH with LUTS -PSA stable -DRE benign  -symptoms - post-void dribbling  -continue conservative management, avoiding bladder irritants and timed voiding's -take tadalafil 5 mg daily    Zara Council, PA-C  [facility] [address], [place], [place] [address] [phone]

## 2022-08-29 ENCOUNTER — Encounter: Payer: Self-pay | Admitting: Urology

## 2022-08-29 ENCOUNTER — Ambulatory Visit: Payer: BC Managed Care – PPO | Admitting: Urology

## 2022-08-29 VITALS — BP 119/81 | HR 91 | Ht 69.0 in | Wt 167.0 lb

## 2022-08-29 DIAGNOSIS — N5201 Erectile dysfunction due to arterial insufficiency: Secondary | ICD-10-CM

## 2022-08-29 DIAGNOSIS — N401 Enlarged prostate with lower urinary tract symptoms: Secondary | ICD-10-CM

## 2022-08-29 DIAGNOSIS — N138 Other obstructive and reflux uropathy: Secondary | ICD-10-CM | POA: Diagnosis not present

## 2022-08-29 LAB — PSA: Prostate Specific Ag, Serum: 0.3 ng/mL (ref 0.0–4.0)

## 2022-08-29 MED ORDER — TADALAFIL 5 MG PO TABS: 5.0000 mg | ORAL_TABLET | Freq: Every day | ORAL | 3 refills | Status: AC | PRN

## 2022-08-29 MED ORDER — TADALAFIL 20 MG PO TABS: 20.0000 mg | ORAL_TABLET | Freq: Every day | ORAL | 6 refills | Status: DC | PRN

## 2022-12-28 ENCOUNTER — Other Ambulatory Visit: Payer: Self-pay | Admitting: Family Medicine

## 2023-03-08 ENCOUNTER — Telehealth: Payer: BC Managed Care – PPO | Admitting: Nurse Practitioner

## 2023-03-08 DIAGNOSIS — Z9189 Other specified personal risk factors, not elsewhere classified: Secondary | ICD-10-CM | POA: Diagnosis not present

## 2023-03-08 DIAGNOSIS — R109 Unspecified abdominal pain: Secondary | ICD-10-CM

## 2023-03-08 DIAGNOSIS — R197 Diarrhea, unspecified: Secondary | ICD-10-CM

## 2023-03-08 MED ORDER — VANCOMYCIN HCL 125 MG PO CAPS: 125.0000 mg | ORAL_CAPSULE | Freq: Four times a day (QID) | ORAL | 0 refills | Status: AC

## 2023-03-08 NOTE — Progress Notes (Signed)
Virtual Visit Consent   [name], you are scheduled for a virtual visit with a [facility] provider today. Just as with appointments in the office, your consent must be obtained to participate. Your consent will be active for this visit and any virtual visit you may have with one of our providers in the next 365 days. If you have a MyChart account, a copy of this consent can be sent to you electronically.  As this is a virtual visit, video technology does not allow for your provider to perform a traditional examination. This may limit your provider's ability to fully assess your condition. If your provider identifies any concerns that need to be evaluated in person or the need to arrange testing (such as labs, EKG, etc.), we will make arrangements to do so. Although advances in technology are sophisticated, we cannot ensure that it will always work on either your end or our end. If the connection with a video visit is poor, the visit may have to be switched to a telephone visit. With either a video or telephone visit, we are not always able to ensure that we have a secure connection.  By engaging in this virtual visit, you consent to the provision of healthcare and authorize for your insurance to be billed (if applicable) for the services provided during this visit. Depending on your insurance coverage, you may receive a charge related to this service.  I need to obtain your verbal consent now. Are you willing to proceed with your visit today? TJADEN BLAMER has provided verbal consent on 03/08/2023 for a virtual visit (video or telephone). Claiborne Rigg, NP  Date: 03/08/2023 8:30 AM  Virtual Visit via Video Note   I, Claiborne Rigg, connected with  Taylor Medina  ([id number], 1966-03-28) on 03/08/23 at  8:30 AM EDT by a video-enabled telemedicine application and verified that I am speaking with the correct person using two identifiers.  Location: Patient: Virtual Visit Location Patient:  Home Provider: Virtual Visit Location Provider: Home Office   I discussed the limitations of evaluation and management by telemedicine and the availability of in person appointments. The patient expressed understanding and agreed to proceed.    History of Present Illness: Taylor Medina is a 57 y.o. who identifies as a male who was assigned male at birth, and is being seen today for possible Cdiff exposure.  Wife is currently being treated for Cdiff and hospitalized with sepsis. Over the past 24 hours he has experienced an episode of non bloody diarrhea associated with abdominal pain. Denies fever, vomiting, hematochezia or melena.     Problems:  Patient Active Problem List   Diagnosis Date Noted   Annual physical exam 07/03/2022   Screening for prostate cancer 07/03/2022   Migraine without aura and without status migrainosus, not intractable 12/10/2021   Statin declined 12/10/2021   Erythrocytosis 01/04/2021   Elevated ferritin 01/04/2021   Tobacco use 01/04/2021   Hyperlipidemia 12/12/2020   Elevated liver enzymes 12/12/2020   Esophageal dysphagia    Thickening of esophagus    Reflux esophagitis    Stomach irritation    FH: colon cancer    Special screening for malignant neoplasms, colon    Benign neoplasm of ascending colon    External hemorrhoids    Benign neoplasm of descending colon    Sleep apnea 10/24/2016   HYPERTENSION, BENIGN 12/24/2009   Palpitations 12/24/2009    Allergies:  Allergies  Allergen Reactions   Influenza Vaccines  Guillain barre syndrome   Medications:  Current Outpatient Medications:    vancomycin (VANCOCIN) 125 MG capsule, Take 1 capsule (125 mg total) by mouth 4 (four) times daily for 10 days., Disp: 40 capsule, Rfl: 0   butalbital-acetaminophen-caffeine (FIORICET) 50-325-40 MG tablet, Take 1 tablet by mouth every 6 (six) hours as needed for headache., Disp: 14 tablet, Rfl: 0   chlorhexidine (PERIDEX) 0.12 % solution, 2 (two) times daily.,  Disp: , Rfl:    DENTA 5000 PLUS 1.1 % CREA dental cream, Take by mouth at bedtime., Disp: , Rfl:    fenofibrate (TRICOR) 145 MG tablet, TAKE 1 TABLET BY MOUTH EVERY DAY, Disp: 90 tablet, Rfl: 0   ibuprofen (ADVIL) 800 MG tablet, Take 800 mg by mouth every 8 (eight) hours as needed., Disp: , Rfl:    lisinopril-hydrochlorothiazide (ZESTORETIC) 20-25 MG tablet, Take 1 tablet by mouth daily., Disp: 90 tablet, Rfl: 3   metoprolol succinate (TOPROL-XL) 25 MG 24 hr tablet, Take 1 tablet (25 mg total) by mouth daily., Disp: 90 tablet, Rfl: 3   tadalafil (CIALIS) 20 MG tablet, Take 1 tablet (20 mg total) by mouth daily as needed for erectile dysfunction., Disp: 30 tablet, Rfl: 6   tadalafil (CIALIS) 5 MG tablet, Take 1 tablet (5 mg total) by mouth daily as needed for erectile dysfunction., Disp: 90 tablet, Rfl: 3  Observations/Objective: Patient is well-developed, well-nourished in no acute distress.  Resting comfortably at hospital with wife  Head is normocephalic, atraumatic.  No labored breathing.  Speech is clear and coherent with logical content.  Patient is alert and oriented at baseline.    Assessment and Plan: 1. At risk for Clostridium difficile infection - vancomycin (VANCOCIN) 125 MG capsule; Take 1 capsule (125 mg total) by mouth 4 (four) times daily for 10 days.  Dispense: 40 capsule; Refill: 0 Follow enteric precautions: washing hands with soap and water upon entering room and exiting room as well as after any hands on contact with spouse  Follow Up Instructions: I discussed the assessment and treatment plan with the patient. The patient was provided an opportunity to ask questions and all were answered. The patient agreed with the plan and demonstrated an understanding of the instructions.  A copy of instructions were sent to the patient via MyChart unless otherwise noted below.     The patient was advised to call back or seek an in-person evaluation if the symptoms worsen or if the  condition fails to improve as anticipated.  Time:  I spent 11 minutes with the patient via telehealth technology discussing the above problems/concerns.    Claiborne Rigg, NP

## 2023-03-08 NOTE — Patient Instructions (Addendum)
Taylor Medina, thank you for joining Claiborne Rigg, NP for today's virtual visit.  While this provider is not your primary care provider (PCP), if your PCP is located in our provider database this encounter information will be shared with them immediately following your visit.   A [facility] MyChart account gives you access to today's visit and all your visits, tests, and labs performed at [facility] " click here if you don't have a [facility] [place] MyChart account or go to [email or web address]  Consent: (Patient) Taylor Medina provided verbal consent for this virtual visit at the beginning of the encounter.  Current Medications:  Current Outpatient Medications:    vancomycin (VANCOCIN) 125 MG capsule, Take 1 capsule (125 mg total) by mouth 4 (four) times daily for 10 days., Disp: 40 capsule, Rfl: 0   butalbital-acetaminophen-caffeine (FIORICET) 50-325-40 MG tablet, Take 1 tablet by mouth every 6 (six) hours as needed for headache., Disp: 14 tablet, Rfl: 0   chlorhexidine (PERIDEX) 0.12 % solution, 2 (two) times daily., Disp: , Rfl:    DENTA 5000 PLUS 1.1 % CREA dental cream, Take by mouth at bedtime., Disp: , Rfl:    fenofibrate (TRICOR) 145 MG tablet, TAKE 1 TABLET BY MOUTH EVERY DAY, Disp: 90 tablet, Rfl: 0   ibuprofen (ADVIL) 800 MG tablet, Take 800 mg by mouth every 8 (eight) hours as needed., Disp: , Rfl:    lisinopril-hydrochlorothiazide (ZESTORETIC) 20-25 MG tablet, Take 1 tablet by mouth daily., Disp: 90 tablet, Rfl: 3   metoprolol succinate (TOPROL-XL) 25 MG 24 hr tablet, Take 1 tablet (25 mg total) by mouth daily., Disp: 90 tablet, Rfl: 3   tadalafil (CIALIS) 20 MG tablet, Take 1 tablet (20 mg total) by mouth daily as needed for erectile dysfunction., Disp: 30 tablet, Rfl: 6   tadalafil (CIALIS) 5 MG tablet, Take 1 tablet (5 mg total) by mouth daily as needed for erectile dysfunction., Disp: 90 tablet, Rfl: 3   Medications ordered in this encounter:  Meds ordered  this encounter  Medications   vancomycin (VANCOCIN) 125 MG capsule    Sig: Take 1 capsule (125 mg total) by mouth 4 (four) times daily for 10 days.    Dispense:  40 capsule    Refill:  0    Order Specific Question:   Supervising Provider    Answer:   Merrilee Jansky [id number]     *If you need refills on other medications prior to your next appointment, please contact your pharmacy*  Follow-Up: Call back or seek an in-person evaluation if the symptoms worsen or if the condition fails to improve as anticipated.  [facility] [phone]  Other Instructions Follow enteric precautions: washing hands with soap and water upon entering room and exiting room as well as after any hands on contact with spouse   If you have been instructed to have an in-person evaluation today at a local Urgent Care facility, please use the link below. It will take you to a list of all of our available [facility], including address, phone number and hours of operation. Please do not delay care.  [facility]  If you or a family member do not have a primary care provider, use the link below to schedule a visit and establish care. When you choose a [facility] primary care physician or advanced practice provider, you gain a long-term partner in health. Find a Primary Care Provider  Learn more about 's  in-office and virtual care options: [facility] - Get Care Now

## 2023-03-23 ENCOUNTER — Encounter: Payer: Self-pay | Admitting: Family Medicine

## 2023-03-23 ENCOUNTER — Ambulatory Visit: Payer: BC Managed Care – PPO | Admitting: Family Medicine

## 2023-03-23 VITALS — BP 125/77 | HR 70 | Temp 98.2°F | Ht 69.0 in | Wt 181.0 lb

## 2023-03-23 DIAGNOSIS — M25561 Pain in right knee: Secondary | ICD-10-CM | POA: Diagnosis not present

## 2023-03-23 DIAGNOSIS — R1011 Right upper quadrant pain: Secondary | ICD-10-CM

## 2023-03-23 DIAGNOSIS — Z720 Tobacco use: Secondary | ICD-10-CM

## 2023-03-23 DIAGNOSIS — R748 Abnormal levels of other serum enzymes: Secondary | ICD-10-CM | POA: Diagnosis not present

## 2023-03-23 DIAGNOSIS — R0789 Other chest pain: Secondary | ICD-10-CM | POA: Diagnosis not present

## 2023-03-24 ENCOUNTER — Ambulatory Visit
Admission: RE | Admit: 2023-03-24 | Discharge: 2023-03-24 | Disposition: A | Discharge status: Home / Self Care | Payer: BC Managed Care – PPO | Attending: Family Medicine | Admitting: Family Medicine

## 2023-03-24 ENCOUNTER — Encounter: Payer: Self-pay | Admitting: Family Medicine

## 2023-03-24 ENCOUNTER — Ambulatory Visit
Admission: RE | Admit: 2023-03-24 | Discharge: 2023-03-24 | Disposition: A | Discharge status: Home / Self Care | Payer: BC Managed Care – PPO | Source: Ambulatory Visit | Attending: Family Medicine | Admitting: Family Medicine

## 2023-03-24 DIAGNOSIS — R0789 Other chest pain: Secondary | ICD-10-CM

## 2023-03-24 DIAGNOSIS — R079 Chest pain, unspecified: Secondary | ICD-10-CM | POA: Diagnosis not present

## 2023-03-24 DIAGNOSIS — R059 Cough, unspecified: Secondary | ICD-10-CM | POA: Diagnosis not present

## 2023-03-24 MED ORDER — PANTOPRAZOLE SODIUM 40 MG PO TBEC: 40.0000 mg | DELAYED_RELEASE_TABLET | Freq: Two times a day (BID) | ORAL | 1 refills | Status: DC

## 2023-03-24 NOTE — Assessment & Plan Note (Signed)
Denies squeezing, stabbing, shooting; R side; able to replicate with deep palpation with rebound pain noted. Pt in NAD: non toxic appearance  Defer EKG given no pain at this time

## 2023-03-24 NOTE — Assessment & Plan Note (Signed)
Chronic, repeat CMP given complaints of RUQ/R chest pain- rebound tenderness Continue to recommend bland diet

## 2023-03-24 NOTE — Progress Notes (Signed)
Borderline hyponatremia; ongoing concern. Liver enzymes remain elevated; however, improved from previous labs within the last 1-2 years.  Breath test pending.

## 2023-03-24 NOTE — Assessment & Plan Note (Signed)
Chronic, unchanged Repeat CXR given atypical CP/RUQ pain Pt endorses more phlegm in AM; and has been coughing more Continue supportive care measures

## 2023-03-24 NOTE — Assessment & Plan Note (Signed)
~  1 month of RUQ/R distal chest discomfort Not improved with tums Improved with ibuprofen Repeat labs including H Pylori Trial of PPI to assist Weight gain noted since last visit which may be exacerbating the concern Continue to recommend balanced, lower carb meals. Smaller meal size, adding snacks. Choosing water as drink of choice and increasing purposeful exercise.

## 2023-03-24 NOTE — Progress Notes (Signed)
Established patient visit   Patient: Taylor Medina   DOB: [birth year]   57 y.o. Male  MRN: [id number] Visit Date: 03/23/2023  Today's healthcare provider: Jacky Kindle, FNP  Re Introduced to nurse practitioner role and practice setting.  All questions answered.  Discussed provider/patient relationship and expectations.  Chief Complaint  Patient presents with   Chest Pain    Pt stated---mid-side of the chest worse right side --burning, random pain--3-4 weeks   Subjective    Chest Pain    HPI     Chest Pain    Additional comments: Pt stated---mid-side of the chest worse right side --burning, random pain--3-4 weeks      Last edited by Shelly Bombard, CMA on 03/23/2023  1:05 PM.      Medications: Outpatient Medications Prior to Visit  Medication Sig   butalbital-acetaminophen-caffeine (FIORICET) 50-325-40 MG tablet Take 1 tablet by mouth every 6 (six) hours as needed for headache.   chlorhexidine (PERIDEX) 0.12 % solution 2 (two) times daily.   DENTA 5000 PLUS 1.1 % CREA dental cream Take by mouth at bedtime.   fenofibrate (TRICOR) 145 MG tablet TAKE 1 TABLET BY MOUTH EVERY DAY   ibuprofen (ADVIL) 800 MG tablet Take 800 mg by mouth every 8 (eight) hours as needed.   lisinopril-hydrochlorothiazide (ZESTORETIC) 20-25 MG tablet Take 1 tablet by mouth daily.   metoprolol succinate (TOPROL-XL) 25 MG 24 hr tablet Take 1 tablet (25 mg total) by mouth daily.   tadalafil (CIALIS) 20 MG tablet Take 1 tablet (20 mg total) by mouth daily as needed for erectile dysfunction.   tadalafil (CIALIS) 5 MG tablet Take 1 tablet (5 mg total) by mouth daily as needed for erectile dysfunction.   No facility-administered medications prior to visit.    Review of Systems  Cardiovascular:  Positive for chest pain.    Last CBC Lab Results  Component Value Date   WBC 7.9 03/23/2023   HGB 17.0 03/23/2023   HCT 49.2 03/23/2023   MCV 89 03/23/2023   MCH 30.7 03/23/2023   RDW 12.3 03/23/2023    PLT 240 03/23/2023   Last metabolic panel Lab Results  Component Value Date   GLUCOSE 96 03/23/2023   NA 133 (L) 03/23/2023   K 4.6 03/23/2023   CL 100 03/23/2023   CO2 21 03/23/2023   BUN 11 03/23/2023   CREATININE 1.07 03/23/2023   EGFR 82 03/23/2023   CALCIUM 9.8 03/23/2023   PROT 7.4 03/23/2023   ALBUMIN 4.6 03/23/2023   LABGLOB 2.8 03/23/2023   AGRATIO 1.6 03/23/2023   BILITOT 0.6 03/23/2023   ALKPHOS 74 03/23/2023   AST 30 03/23/2023   ALT 58 (H) 03/23/2023   ANIONGAP 6 05/23/2021   Last lipids Lab Results  Component Value Date   CHOL 208 (H) 07/03/2022   HDL 44 07/03/2022   LDLCALC 89 07/03/2022   LDLDIRECT 99 04/07/2019   TRIG 458 (H) 07/03/2022   CHOLHDL 4.7 07/03/2022   Last hemoglobin A1c Lab Results  Component Value Date   HGBA1C 5.6 12/10/2020   Last thyroid functions Lab Results  Component Value Date   TSH 3.900 07/03/2022       Objective    BP 125/77 (BP Location: Left Arm, Patient Position: Sitting, Cuff Size: Normal)   Pulse 70   Temp 98.2 F (36.8 C)   Ht 5\' 9"  (1.753 m)   Wt 181 lb (82.1 kg)   SpO2 98%   BMI 26.73  kg/m   BP Readings from Last 3 Encounters:  03/23/23 125/77  08/29/22 119/81  07/24/22 138/84   Wt Readings from Last 3 Encounters:  03/23/23 181 lb (82.1 kg)  08/29/22 167 lb (75.8 kg)  07/24/22 171 lb (77.6 kg)   SpO2 Readings from Last 3 Encounters:  03/23/23 98%  07/24/22 99%  07/03/22 98%      Physical Exam Vitals and nursing note reviewed.  Constitutional:      General: He is not in acute distress.    Appearance: Normal appearance. He is well-developed, well-groomed and overweight. He is not ill-appearing, toxic-appearing or diaphoretic.  HENT:     Head: Normocephalic and atraumatic.  Eyes:     Pupils: Pupils are equal, round, and reactive to light.  Cardiovascular:     Rate and Rhythm: Normal rate and regular rhythm.     Pulses: Normal pulses.     Heart sounds: Normal heart sounds.   Pulmonary:     Effort: Pulmonary effort is normal.     Breath sounds: Normal breath sounds.  Abdominal:     General: Bowel sounds are normal. There is no distension.     Palpations: Abdomen is soft. There is no mass.     Tenderness: There is abdominal tenderness. There is guarding and rebound. There is no right CVA tenderness or left CVA tenderness.     Hernia: No hernia is present.    Musculoskeletal:        General: Normal range of motion.     Cervical back: Normal range of motion.  Skin:    General: Skin is warm and dry.     Capillary Refill: Capillary refill takes less than 2 seconds.  Neurological:     General: No focal deficit present.     Mental Status: He is alert and oriented to person, place, and time. Mental status is at baseline.  Psychiatric:        Mood and Affect: Mood normal. Mood is not anxious.        Behavior: Behavior normal.     Results for orders placed or performed in visit on 03/23/23  H. pylori breath test  Result Value Ref Range   H pylori Breath Test WILL FOLLOW   Comprehensive Metabolic Panel (CMET)  Result Value Ref Range   Glucose 96 70 - 99 mg/dL   BUN 11 6 - 24 mg/dL   Creatinine, Ser 1.61 0.76 - 1.27 mg/dL   eGFR 82 >09 UE/AVW/0.98   BUN/Creatinine Ratio 10 9 - 20   Sodium 133 (L) 134 - 144 mmol/L   Potassium 4.6 3.5 - 5.2 mmol/L   Chloride 100 96 - 106 mmol/L   CO2 21 20 - 29 mmol/L   Calcium 9.8 8.7 - 10.2 mg/dL   Total Protein 7.4 6.0 - 8.5 g/dL   Albumin 4.6 3.8 - 4.9 g/dL   Globulin, Total 2.8 1.5 - 4.5 g/dL   Albumin/Globulin Ratio 1.6    Bilirubin Total 0.6 0.0 - 1.2 mg/dL   Alkaline Phosphatase 74 44 - 121 IU/L   AST 30 0 - 40 IU/L   ALT 58 (H) 0 - 44 IU/L  Amylase  Result Value Ref Range   Amylase 84 31 - 110 U/L  CBC  Result Value Ref Range   WBC 7.9 3.4 - 10.8 x10E3/uL   RBC 5.54 4.14 - 5.80 x10E6/uL   Hemoglobin 17.0 13.0 - 17.7 g/dL   Hematocrit 11.9 14.7 - 51.0 %   MCV 89  79 - 97 fL   MCH 30.7 26.6 - 33.0 pg    MCHC 34.6 31.5 - 35.7 g/dL   RDW 40.9 81.1 - 91.4 %   Platelets 240 150 - 450 x10E3/uL    Assessment & Plan     Problem List Items Addressed This Visit       Other   Atypical chest pain    Denies squeezing, stabbing, shooting; R side; able to replicate with deep palpation with rebound pain noted. Pt in NAD: non toxic appearance  Defer EKG given no pain at this time       Relevant Orders   DG Chest 2 View   Colicky RUQ abdominal pain - Primary    ~1 month of RUQ/R distal chest discomfort Not improved with tums Improved with ibuprofen Repeat labs including H Pylori Trial of PPI to assist Weight gain noted since last visit which may be exacerbating the concern Continue to recommend balanced, lower carb meals. Smaller meal size, adding snacks. Choosing water as drink of choice and increasing purposeful exercise.       Relevant Orders   H. pylori breath test (Completed)   Comprehensive Metabolic Panel (CMET) (Completed)   Amylase (Completed)   CBC (Completed)   Elevated liver enzymes    Chronic, repeat CMP given complaints of RUQ/R chest pain- rebound tenderness Continue to recommend bland diet       Relevant Orders   Comprehensive Metabolic Panel (CMET) (Completed)   RESOLVED: Recurrent pain of right knee   Relevant Orders   Comprehensive Metabolic Panel (CMET) (Completed)   Amylase (Completed)   CBC (Completed)   Tobacco use    Chronic, unchanged Repeat CXR given atypical CP/RUQ pain Pt endorses more phlegm in AM; and has been coughing more Continue supportive care measures       Return if symptoms worsen or fail to improve or seek emergent care/call 9-1-1.     Leilani Merl, FNP, have reviewed all documentation for this visit. The documentation on 03/24/23 for the exam, diagnosis, procedures, and orders are all accurate and complete.  Jacky Kindle, FNP  [facility] [phone] (phone) [phone] (fax)  [facility]

## 2023-03-25 LAB — COMPREHENSIVE METABOLIC PANEL
ALT: 58 IU/L — ABNORMAL HIGH (ref 0–44)
AST: 30 IU/L (ref 0–40)
Albumin/Globulin Ratio: 1.6
Albumin: 4.6 g/dL (ref 3.8–4.9)
Alkaline Phosphatase: 74 IU/L (ref 44–121)
BUN/Creatinine Ratio: 10 (ref 9–20)
BUN: 11 mg/dL (ref 6–24)
Bilirubin Total: 0.6 mg/dL (ref 0.0–1.2)
CO2: 21 mmol/L (ref 20–29)
Calcium: 9.8 mg/dL (ref 8.7–10.2)
Chloride: 100 mmol/L (ref 96–106)
Creatinine, Ser: 1.07 mg/dL (ref 0.76–1.27)
Globulin, Total: 2.8 g/dL (ref 1.5–4.5)
Glucose: 96 mg/dL (ref 70–99)
Potassium: 4.6 mmol/L (ref 3.5–5.2)
Sodium: 133 mmol/L — ABNORMAL LOW (ref 134–144)
Total Protein: 7.4 g/dL (ref 6.0–8.5)
eGFR: 82 mL/min/{1.73_m2} (ref >59)

## 2023-03-25 LAB — CBC
Hematocrit: 49.2 % (ref 37.5–51.0)
Hemoglobin: 17 g/dL (ref 13.0–17.7)
MCH: 30.7 pg (ref 26.6–33.0)
MCHC: 34.6 g/dL (ref 31.5–35.7)
MCV: 89 fL (ref 79–97)
Platelets: 240 10*3/uL (ref 150–450)
RBC: 5.54 x10E6/uL (ref 4.14–5.80)
RDW: 12.3 % (ref 11.6–15.4)
WBC: 7.9 10*3/uL (ref 3.4–10.8)

## 2023-03-25 LAB — H. PYLORI BREATH TEST: H pylori Breath Test: NEGATIVE

## 2023-03-25 LAB — AMYLASE: Amylase: 84 U/L (ref 31–110)

## 2023-03-25 NOTE — Progress Notes (Signed)
H pylori negative; continue protonix to assist with symptom management.

## 2023-03-30 ENCOUNTER — Telehealth: Payer: Self-pay

## 2023-03-30 NOTE — Telephone Encounter (Signed)
Copied from CRM [id number]. Topic: General - Inquiry >> Mar 30, 2023 11:50 AM De Blanch wrote: Reason for CRM:Pt is requesting a callback with recent chest x-ray results when ready.  Please advise.

## 2023-03-31 NOTE — Progress Notes (Signed)
Mild bronchitis noted on xray. Continue monitor symptoms.

## 2023-04-01 ENCOUNTER — Other Ambulatory Visit: Payer: Self-pay | Admitting: Family Medicine

## 2023-04-01 NOTE — Telephone Encounter (Signed)
Patient advised. Verbalized understanding

## 2023-04-01 NOTE — Telephone Encounter (Signed)
Requested Prescriptions  Pending Prescriptions Disp Refills   fenofibrate (TRICOR) 145 MG tablet [Pharmacy Med Name: FENOFIBRATE 145 MG TABLET] 90 tablet 0    Sig: TAKE 1 TABLET BY MOUTH EVERY DAY     Cardiovascular:  Antilipid - Fibric Acid Derivatives Failed - 04/01/2023  2:19 AM      Failed - ALT in normal range and within 360 days    ALT  Date Value Ref Range Status  03/23/2023 58 (H) 0 - 44 IU/L Final   SGPT (ALT)  Date Value Ref Range Status  01/31/2015 181 (H) U/L Final    Comment:    17-63 NOTE: New Reference Range  12/19/14          Failed - Lipid Panel in normal range within the last 12 months    Cholesterol, Total  Date Value Ref Range Status  07/03/2022 208 (H) 100 - 199 mg/dL Final   LDL Chol Calc (NIH)  Date Value Ref Range Status  07/03/2022 89 0 - 99 mg/dL Final   LDL Direct  Date Value Ref Range Status  04/07/2019 99 0 - 99 mg/dL Final   HDL  Date Value Ref Range Status  07/03/2022 44 >39 mg/dL Final   Triglycerides  Date Value Ref Range Status  07/03/2022 458 (H) 0 - 149 mg/dL Final         Passed - AST in normal range and within 360 days    AST  Date Value Ref Range Status  03/23/2023 30 0 - 40 IU/L Final   SGOT(AST)  Date Value Ref Range Status  01/31/2015 84 (H) U/L Final    Comment:    15-41 NOTE: New Reference Range  12/19/14          Passed - Cr in normal range and within 360 days    Creatinine  Date Value Ref Range Status  01/31/2015 1.18 mg/dL Final    Comment:    1.61-0.96 NOTE: New Reference Range  12/19/14    Creatinine, Ser  Date Value Ref Range Status  03/23/2023 1.07 0.76 - 1.27 mg/dL Final         Passed - HGB in normal range and within 360 days    Hemoglobin  Date Value Ref Range Status  03/23/2023 17.0 13.0 - 17.7 g/dL Final         Passed - HCT in normal range and within 360 days    Hematocrit  Date Value Ref Range Status  03/23/2023 49.2 37.5 - 51.0 % Final         Passed - PLT in normal range  and within 360 days    Platelets  Date Value Ref Range Status  03/23/2023 240 150 - 450 x10E3/uL Final         Passed - WBC in normal range and within 360 days    WBC  Date Value Ref Range Status  03/23/2023 7.9 3.4 - 10.8 x10E3/uL Final  05/23/2021 9.9 4.0 - 10.5 K/uL Final         Passed - eGFR is 30 or above and within 360 days    EGFR (African American)  Date Value Ref Range Status  01/31/2015 >60  Final   GFR calc Af Amer  Date Value Ref Range Status  12/06/2020 106 >59 mL/min/1.73 Final    Comment:    **In accordance with recommendations from the NKF-ASN Task force,**   [facility] is in the process of updating its eGFR calculation to the   2021  CKD-EPI creatinine equation that estimates kidney function   without a race variable.    EGFR (Non-African Amer.)  Date Value Ref Range Status  01/31/2015 >60  Final    Comment:    eGFR values <27mL/min/1.73 m2 may be an indication of chronic kidney disease (CKD). Calculated eGFR is useful in patients with stable renal function. The eGFR calculation will not be reliable in acutely ill patients when serum creatinine is changing rapidly. It is not useful in patients on dialysis. The eGFR calculation may not be applicable to patients at the low and high extremes of body sizes, pregnant women, and vegetarians.    GFR, Estimated  Date Value Ref Range Status  05/23/2021 >60 >60 mL/min Final    Comment:    (NOTE) Calculated using the CKD-EPI Creatinine Equation (2021)    eGFR  Date Value Ref Range Status  03/23/2023 82 >59 mL/min/1.73 Final         Passed - Valid encounter within last 12 months    Recent Outpatient Visits           1 week ago Colicky RUQ abdominal pain   [facility] Jacky Kindle, FNP   8 months ago HYPERTENSION, BENIGN   [facility] Jacky Kindle, FNP   9 months ago Annual physical exam   [facility] Jacky Kindle,  FNP   1 year ago HYPERTENSION, BENIGN   [place] [facility] Jacky Kindle, FNP   1 year ago HYPERTENSION, BENIGN   [place] [facility] Bacigalupo, Marzella Schlein, MD       Future Appointments             In 5 months McGowan, Elana Alm [facility] [place]

## 2023-04-16 ENCOUNTER — Other Ambulatory Visit: Payer: Self-pay | Admitting: Family Medicine

## 2023-04-17 NOTE — Telephone Encounter (Signed)
Requested Prescriptions  Pending Prescriptions Disp Refills   pantoprazole (PROTONIX) 40 MG tablet [Pharmacy Med Name: PANTOPRAZOLE SOD DR 40 MG TAB] 180 tablet 0    Sig: TAKE 1 TABLET (40 MG TOTAL) BY MOUTH TWICE A DAY BEFORE MEALS     Gastroenterology: Proton Pump Inhibitors Passed - 04/16/2023  9:32 AM      Passed - Valid encounter within last 12 months    Recent Outpatient Visits           3 weeks ago Colicky RUQ abdominal pain   [facility] Jacky Kindle, FNP   8 months ago HYPERTENSION, BENIGN   [place] [facility] Jacky Kindle, FNP   9 months ago Annual physical exam   [facility] Jacky Kindle, FNP   1 year ago HYPERTENSION, BENIGN   [facility] [facility] Jacky Kindle, FNP   1 year ago HYPERTENSION, BENIGN   [place] [facility] Bacigalupo, Marzella Schlein, MD       Future Appointments             In 4 months McGowan, Elana Alm [facility] [place]

## 2023-04-28 ENCOUNTER — Ambulatory Visit: Payer: BC Managed Care – PPO | Admitting: Physician Assistant

## 2023-04-28 ENCOUNTER — Encounter: Payer: Self-pay | Admitting: Physician Assistant

## 2023-04-28 ENCOUNTER — Ambulatory Visit: Payer: Self-pay | Admitting: *Deleted

## 2023-04-28 VITALS — BP 138/91 | HR 62 | Temp 98.4°F | Ht 69.0 in | Wt 179.0 lb

## 2023-04-28 DIAGNOSIS — R030 Elevated blood-pressure reading, without diagnosis of hypertension: Secondary | ICD-10-CM | POA: Diagnosis not present

## 2023-04-28 DIAGNOSIS — K648 Other hemorrhoids: Secondary | ICD-10-CM

## 2023-04-28 DIAGNOSIS — R1032 Left lower quadrant pain: Secondary | ICD-10-CM | POA: Diagnosis not present

## 2023-04-28 LAB — POCT URINALYSIS DIPSTICK
Bilirubin, UA: NEGATIVE
Blood, UA: NEGATIVE
Glucose, UA: NEGATIVE
Leukocytes, UA: NEGATIVE
Nitrite, UA: NEGATIVE
Protein, UA: POSITIVE — AB
Spec Grav, UA: 1.02 (ref 1.010–1.025)
Urobilinogen, UA: 0.2 E.U./dL
pH, UA: 6 (ref 5.0–8.0)

## 2023-04-28 NOTE — Telephone Encounter (Signed)
  Chief Complaint: Abdominal pain Symptoms: lower abdominal pain, "I know it's my diverticulosis, Feels the same."  Some loose stools  Frequency: 3 days Pertinent Negatives: Patient denies  Disposition: [] ED /[] Urgent Care (no appt availability in office) / [x] Appointment(In office/virtual)/ []  [place] [facility]/ [] Home Care/ [] Refused Recommended Disposition /[]  Mobile Bus/ []  Follow-up with PCP Additional Notes: Pt called requesting the ATB he usually receives for flare ups. Advised would need appt. Appt secured for today, care advise provided. Pt verbalizes understanding, ED for worsening symptoms.  Reason for Disposition  [1] MODERATE pain (e.g., interferes with normal activities) AND [2] pain comes and goes (cramps) AND [3] present > 24 hours  (Exception: Pain with Vomiting or Diarrhea - see that Guideline.)  Answer Assessment - Initial Assessment Questions 1. LOCATION: "Where does it hurt?"      Lower abdominal pain. L>R 2. RADIATION: "Does the pain shoot anywhere else?" (e.g., chest, back)     No 3. ONSET: "When did the pain begin?" (Minutes, hours or days ago)      3 days ago 4. SUDDEN: "Gradual or sudden onset?"     Gradual 5. PATTERN "Does the pain come and go, or is it constant?"    - If it comes and goes: "How long does it last?" "Do you have pain now?"     (Note: Comes and goes means the pain is intermittent. It goes away completely between bouts.)    - If constant: "Is it getting better, staying the same, or getting worse?"      (Note: Constant means the pain never goes away completely; most serious pain is constant and gets worse.)      Comes and goes 6. SEVERITY: "How bad is the pain?"  (e.g., Scale 1-10; mild, moderate, or severe)    - MILD (1-3): Doesn't interfere with normal activities, abdomen soft and not tender to touch.     - MODERATE (4-7): Interferes with normal activities or awakens from sleep, abdomen tender to touch.     - SEVERE (8-10):  Excruciating pain, doubled over, unable to do any normal activities.        7. RECURRENT SYMPTOM: "Have you ever had this type of stomach pain before?" If Yes, ask: "When was the last time?" and "What happened that time?"      Yes 8. CAUSE: "What do you think is causing the stomach pain?"     Diverticulosis. 9. RELIEVING/AGGRAVATING FACTORS: "What makes it better or worse?" (e.g., antacids, bending or twisting motion, bowel movement)     no 10. OTHER SYMPTOMS: "Do you have any other symptoms?" (e.g., back pain, diarrhea, fever, urination pain, vomiting)       None  Protocols used: Abdominal Pain - Male-A-AH

## 2023-04-28 NOTE — Progress Notes (Signed)
Established patient visit  Patient: Taylor Medina   DOB: [birth year]   57 y.o. Male  MRN: [id number] Visit Date: 04/28/2023  Today's healthcare provider: Debera Lat, PA-C   No chief complaint on file.  Subjective    HPI  *** Discussed the use of AI scribe software for clinical note transcription with the patient, who gave verbal consent to proceed.  History of Present Illness   The patient, with a history of diverticulitis, presents with abdominal pain that they describe as 'dull' with 'sharp' waves. The pain is located in the lower left quadrant and has been progressively worsening. They report frequent, small volume bowel movements with associated cramping. They also report a single episode of bright red blood per rectum, which they attribute to hemorrhoids. They deny changes in urination, black stools, and fever. They have been managing their symptoms with a clear liquid diet and over-the-counter Dramamine for nausea. They have a history of similar episodes requiring emergency room visits and treatment with antibiotics and pain medication. They are currently taking lisinopril and metoprolol for hypertension.           04/28/2023    1:26 PM 03/23/2023    1:11 PM 07/24/2022    4:10 PM  Depression screen PHQ 2/9  Decreased Interest 0 0 0  Down, Depressed, Hopeless 0 0 0  PHQ - 2 Score 0 0 0  Altered sleeping 0 0 0  Tired, decreased energy 0 0 0  Change in appetite 0 0 0  Feeling bad or failure about yourself  0 0 0  Trouble concentrating 0 0 0  Moving slowly or fidgety/restless 0 0 0  Suicidal thoughts 0 0 0  PHQ-9 Score 0 0 0  Difficult doing work/chores Not difficult at all Not difficult at all Not difficult at all      04/28/2023    1:26 PM  GAD 7 : Generalized Anxiety Score  Nervous, Anxious, on Edge 0  Control/stop worrying 0  Worry too much - different things 0  Trouble relaxing 0  Restless 0  Easily annoyed or irritable 0  Afraid - awful might happen 0  Total  GAD 7 Score 0    Medications: Outpatient Medications Prior to Visit  Medication Sig   butalbital-acetaminophen-caffeine (FIORICET) 50-325-40 MG tablet Take 1 tablet by mouth every 6 (six) hours as needed for headache.   fenofibrate (TRICOR) 145 MG tablet TAKE 1 TABLET BY MOUTH EVERY DAY   ibuprofen (ADVIL) 800 MG tablet Take 800 mg by mouth every 8 (eight) hours as needed.   lisinopril-hydrochlorothiazide (ZESTORETIC) 20-25 MG tablet Take 1 tablet by mouth daily.   metoprolol succinate (TOPROL-XL) 25 MG 24 hr tablet Take 1 tablet (25 mg total) by mouth daily.   pantoprazole (PROTONIX) 40 MG tablet TAKE 1 TABLET (40 MG TOTAL) BY MOUTH TWICE A DAY BEFORE MEALS   tadalafil (CIALIS) 20 MG tablet Take 1 tablet (20 mg total) by mouth daily as needed for erectile dysfunction.   tadalafil (CIALIS) 5 MG tablet Take 1 tablet (5 mg total) by mouth daily as needed for erectile dysfunction.   chlorhexidine (PERIDEX) 0.12 % solution 2 (two) times daily.   DENTA 5000 PLUS 1.1 % CREA dental cream Take by mouth at bedtime. (Patient not taking: Reported on 04/28/2023)   No facility-administered medications prior to visit.    Review of Systems Except see HPI   {Insert previous labs (optional):23779}  {See past labs  Heme  Chem  Endocrine  Serology  Results Review (optional):1}   Objective    BP (!) 138/91 Comment: pt is having a lot pain  Pulse 62   Temp 98.4 F (36.9 C)   Ht 5\' 9"  (1.753 m)   Wt 179 lb (81.2 kg)   SpO2 95%   BMI 26.43 kg/m  {Insert last BP/Wt (optional):23777}  {See vitals history (optional):1}  Physical Exam   Results for orders placed or performed in visit on 04/28/23  POCT urinalysis dipstick  Result Value Ref Range   Color, UA gold    Clarity, UA clear    Glucose, UA Negative Negative   Bilirubin, UA neg    Ketones, UA trace    Spec Grav, UA 1.020 1.010 - 1.025   Blood, UA neg    pH, UA 6.0 5.0 - 8.0   Protein, UA Positive (A) Negative   Urobilinogen, UA  0.2 0.2 or 1.0 E.U./dL   Nitrite, UA neg    Leukocytes, UA Negative Negative   Appearance     Odor      Assessment & Plan    *** Assessment and Plan    Diverticulitis: Abdominal pain, nausea, and change in bowel habits consistent with prior episodes of diverticulitis. No fever or black stools. Mild rectal bleeding likely due to hemorrhoids. -Order basic labs to assess for infection. -Consider starting antibiotics if labs indicate infection. -Consider CT scan if symptoms worsen or persist. -Advise clear diet and rest.  Hemorrhoids: Mild rectal bleeding, likely due to hemorrhoids. No pain associated with hemorrhoids. -Monitor for changes in bleeding.  Hypertension: Stable on Lisinopril and Metoprolol. -Continue current regimen.  Colon Cancer Screening: Due for colonoscopy next year (5-year interval). -Plan for colonoscopy next year.         No follow-ups on file.      [facility]

## 2023-04-29 LAB — COMPREHENSIVE METABOLIC PANEL
ALT: 53 IU/L — ABNORMAL HIGH (ref 0–44)
AST: 33 IU/L (ref 0–40)
Albumin: 4.5 g/dL (ref 3.8–4.9)
Alkaline Phosphatase: 66 IU/L (ref 44–121)
BUN/Creatinine Ratio: 12 (ref 9–20)
BUN: 12 mg/dL (ref 6–24)
Bilirubin Total: 0.7 mg/dL (ref 0.0–1.2)
CO2: 23 mmol/L (ref 20–29)
Calcium: 10 mg/dL (ref 8.7–10.2)
Chloride: 99 mmol/L (ref 96–106)
Creatinine, Ser: 1.03 mg/dL (ref 0.76–1.27)
Globulin, Total: 2.8 g/dL (ref 1.5–4.5)
Glucose: 98 mg/dL (ref 70–99)
Potassium: 5 mmol/L (ref 3.5–5.2)
Sodium: 136 mmol/L (ref 134–144)
Total Protein: 7.3 g/dL (ref 6.0–8.5)
eGFR: 86 mL/min/{1.73_m2} (ref >59)

## 2023-04-29 LAB — CBC WITH DIFFERENTIAL/PLATELET
Basophils Absolute: 0 10*3/uL (ref 0.0–0.2)
Basos: 0 %
EOS (ABSOLUTE): 0.2 10*3/uL (ref 0.0–0.4)
Eos: 2 %
Hematocrit: 50.1 % (ref 37.5–51.0)
Hemoglobin: 17 g/dL (ref 13.0–17.7)
Immature Grans (Abs): 0 10*3/uL (ref 0.0–0.1)
Immature Granulocytes: 0 %
Lymphocytes Absolute: 1.6 10*3/uL (ref 0.7–3.1)
Lymphs: 17 %
MCH: 30.7 pg (ref 26.6–33.0)
MCHC: 33.9 g/dL (ref 31.5–35.7)
MCV: 91 fL (ref 79–97)
Monocytes Absolute: 0.9 10*3/uL (ref 0.1–0.9)
Monocytes: 9 %
Neutrophils Absolute: 6.6 10*3/uL (ref 1.4–7.0)
Neutrophils: 72 %
Platelets: 217 10*3/uL (ref 150–450)
RBC: 5.53 x10E6/uL (ref 4.14–5.80)
RDW: 12.6 % (ref 11.6–15.4)
WBC: 9.3 10*3/uL (ref 3.4–10.8)

## 2023-04-29 LAB — SEDIMENTATION RATE: Sed Rate: 2 mm/hr (ref 0–30)

## 2023-04-29 MED ORDER — METRONIDAZOLE 500 MG PO TABS
500.0000 mg | ORAL_TABLET | Freq: Three times a day (TID) | ORAL | 0 refills | Status: AC
Start: 2023-04-29 — End: 2023-05-06

## 2023-04-29 MED ORDER — CIPROFLOXACIN HCL 500 MG PO TABS
500.0000 mg | ORAL_TABLET | Freq: Two times a day (BID) | ORAL | 0 refills | Status: AC
Start: 2023-04-29 — End: 2023-05-09

## 2023-04-29 NOTE — Progress Notes (Signed)
Spoke with pt about his lab results

## 2023-05-16 IMAGING — CT CT ABD-PELV W/ CM
2 of 5 series · 16 of 46 positions shown, 18 images · IV contrast (APPLIED)
Comparison: 10/27/2017

CLINICAL DATA: RIGHT lower quadrant abdominal pain for 3 days,
low-grade fever, diarrhea

EXAM:
CT ABDOMEN AND PELVIS WITH CONTRAST
TECHNIQUE: Multidetector CT imaging of the abdomen and pelvis was performed
using the standard protocol following bolus administration of
intravenous contrast. Sagittal and coronal MPR images reconstructed
from axial data set.
CONTRAST:  100mL OMNIPAQUE IOHEXOL 350 MG/ML SOLN IV. No oral
contrast.

[Series 2: routine abd/pel with · axial · 0.75mm/px · z∈[-581,-156]mm · 13 of 97 slices shown, 15 images]
[im 6/97  soft-tissue]
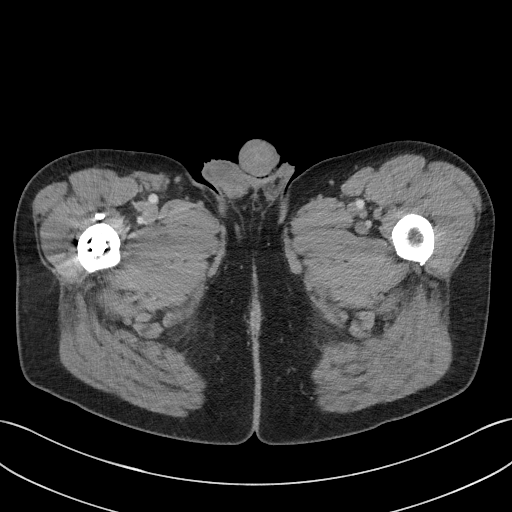
[im 6/97  bone]
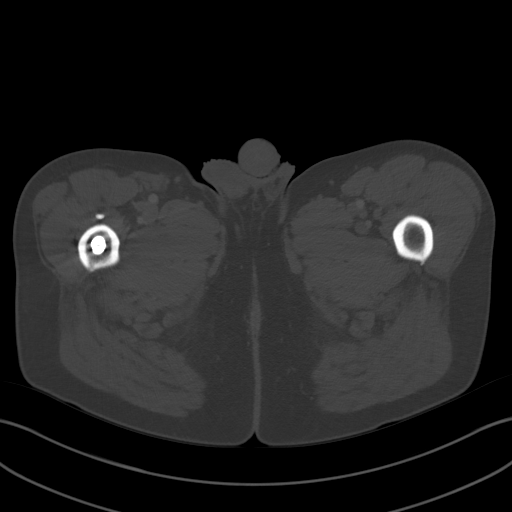
[im 11/97  soft-tissue]
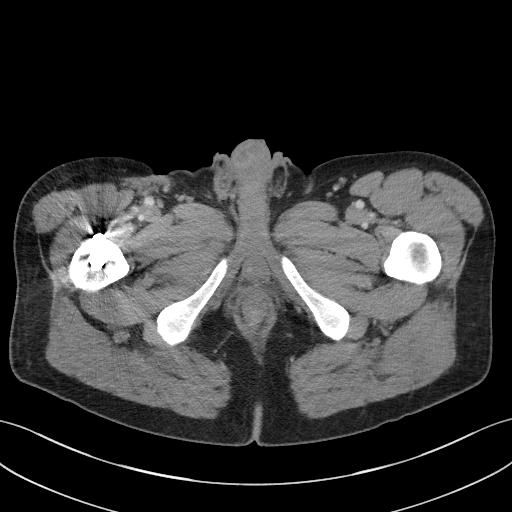
[im 22/97  soft-tissue]
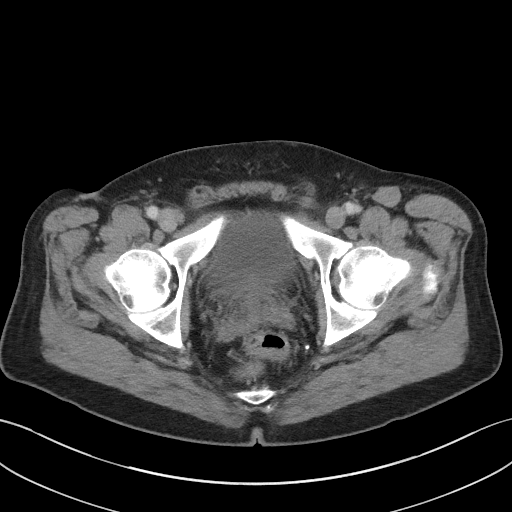
[im 27/97  soft-tissue]
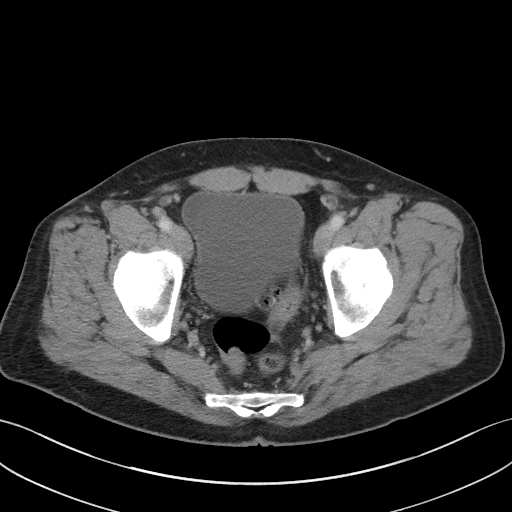
[im 33/97  soft-tissue]
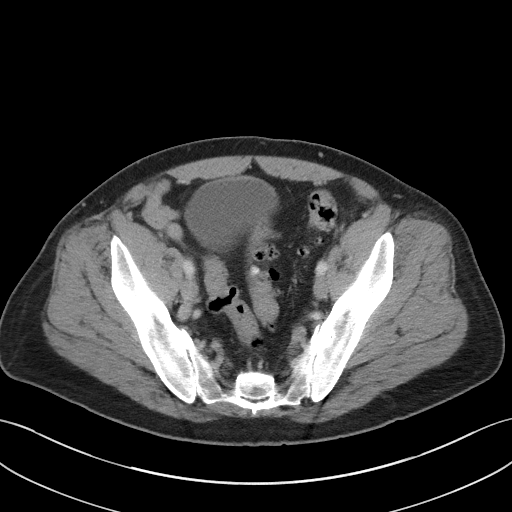
[im 43/97  soft-tissue]
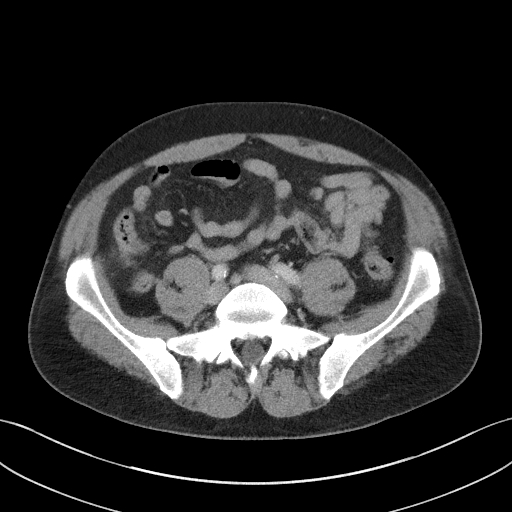
[im 49/97  soft-tissue]
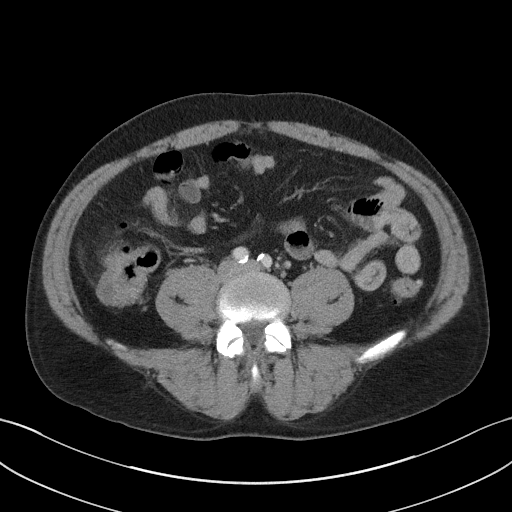
[im 54/97  soft-tissue]
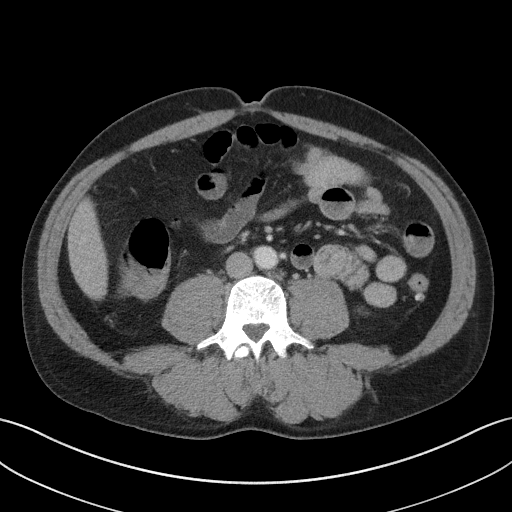
[im 65/97  soft-tissue]
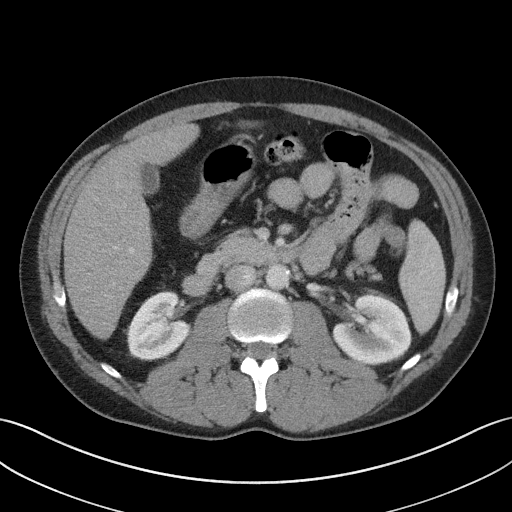
[im 65/97  bone]
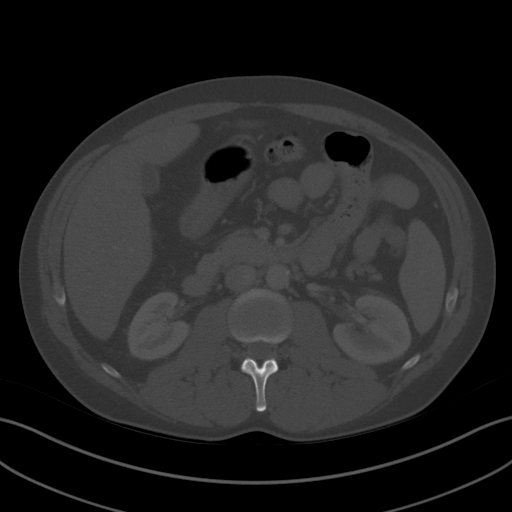
[im 70/97  soft-tissue]
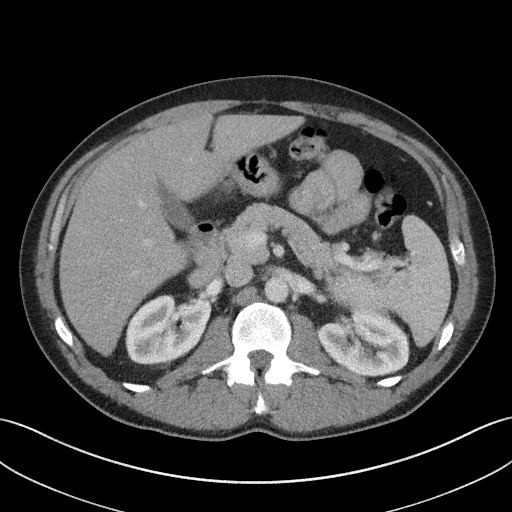
[im 75/97  soft-tissue]
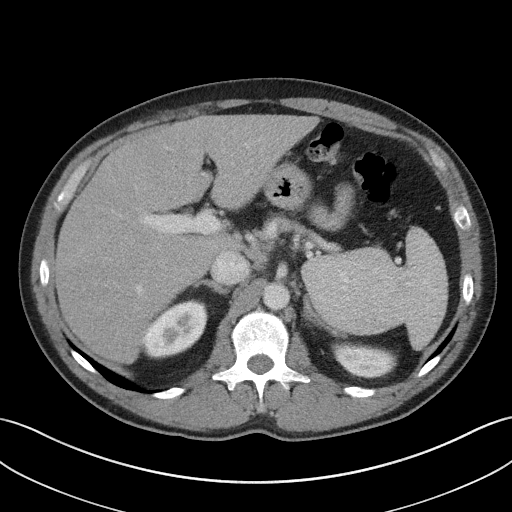
[im 86/97  soft-tissue]
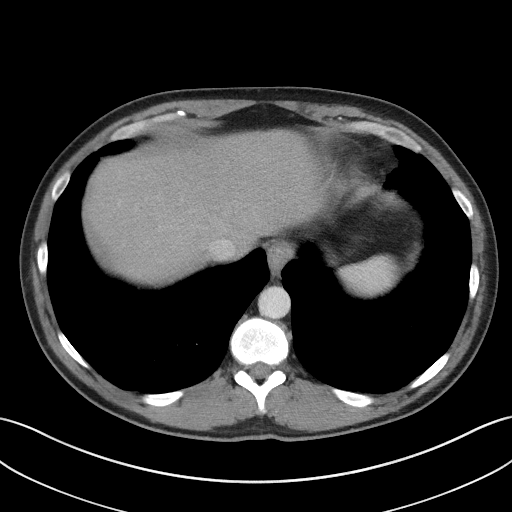
[im 91/97  soft-tissue]
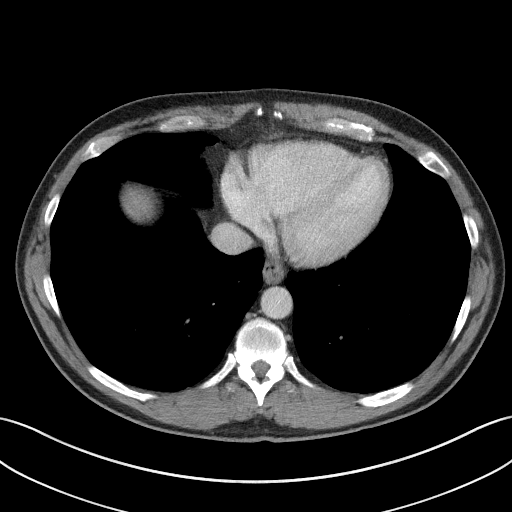

[Series 5: coronal st · coronal · 0.75mm/px · 3 of 94 slices shown]
[im 32/94  soft-tissue]
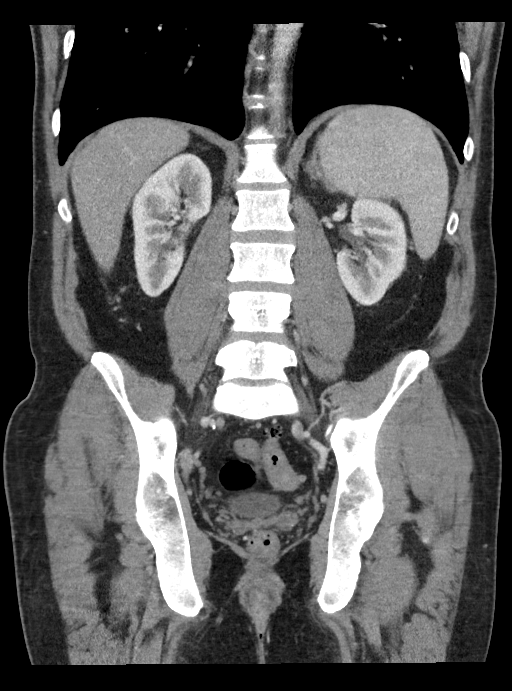
[im 42/94  soft-tissue]
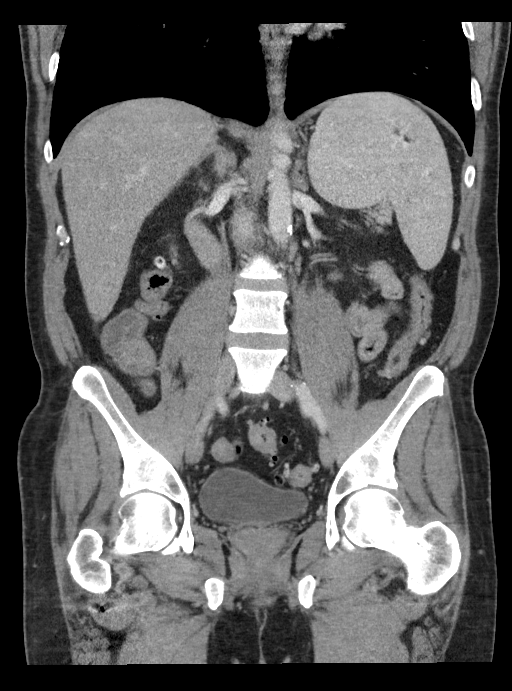
[im 52/94  soft-tissue]
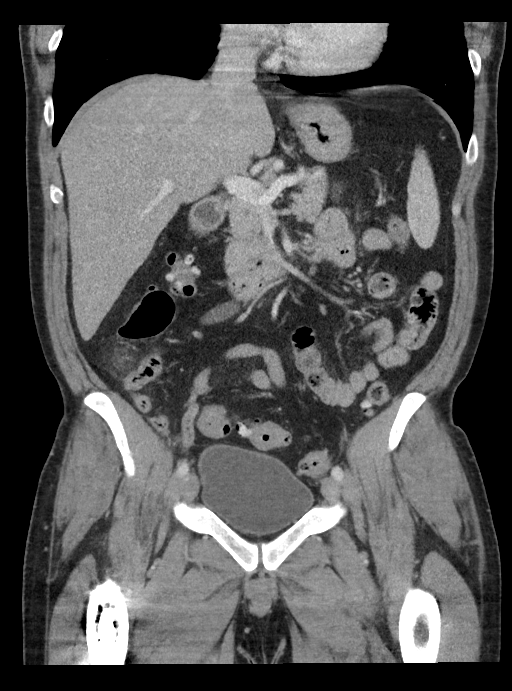

[16 of 46 positions shown; findings below may reference images not displayed]

FINDINGS: Lower chest: Dependent atelectasis at lung bases

Hepatobiliary: Gallbladder and liver normal appearance

Pancreas: Normal appearance

Spleen: Normal appearance

Adrenals/Urinary Tract: Adrenal glands, kidneys, ureters, and
bladder normal appearance

Stomach/Bowel: Normal appendix. Diffuse diverticulosis of colon.
Wall thickening with surrounding inflammatory changes at cecum
consistent with acute cecal diverticulitis. No evidence of
perforation or abscess. Small diverticulum from cranial aspect of
third portion of duodenum. Stomach and bowel loops otherwise normal
appearance.

Vascular/Lymphatic: Atherosclerotic calcifications aorta and iliac
arteries without aneurysm. No adenopathy.

Reproductive: Prostate gland and seminal vesicles unremarkable

Other: No free air or free fluid.  No hernia.

Musculoskeletal: Unremarkable
IMPRESSION: Diffuse diverticulosis of the colon with acute cecal diverticulitis.

No evidence of perforation or abscess.

## 2023-06-03 ENCOUNTER — Other Ambulatory Visit: Payer: Self-pay | Admitting: Family Medicine

## 2023-06-03 DIAGNOSIS — I10 Essential (primary) hypertension: Secondary | ICD-10-CM

## 2023-07-08 ENCOUNTER — Other Ambulatory Visit: Payer: Self-pay | Admitting: Family Medicine

## 2023-07-20 ENCOUNTER — Other Ambulatory Visit: Payer: Self-pay | Admitting: Family Medicine

## 2023-07-21 NOTE — Telephone Encounter (Signed)
Requested Prescriptions  Pending Prescriptions Disp Refills   pantoprazole (PROTONIX) 40 MG tablet [Pharmacy Med Name: PANTOPRAZOLE SOD DR 40 MG TAB] 180 tablet 0    Sig: TAKE 1 TABLET (40 MG TOTAL) BY MOUTH TWICE A DAY BEFORE MEALS     Gastroenterology: Proton Pump Inhibitors Passed - 07/20/2023 10:30 AM      Passed - Valid encounter within last 12 months    Recent Outpatient Visits           2 months ago Colicky LLQ abdominal pain   [place] [facility] [place], [place], PA-C   4 months ago Colicky RUQ abdominal pain   [facility] Merita Norton T, [place]   12 months ago HYPERTENSION, BENIGN   [place] [facility] Jacky Kindle, FNP   1 year ago Annual physical exam   [facility] Jacky Kindle, FNP   1 year ago HYPERTENSION, BENIGN   [place] [facility] Jacky Kindle, FNP       Future Appointments             In 1 month [place], Elana [facility] [place]

## 2023-08-31 NOTE — Progress Notes (Deleted)
09/02/2023 4:25 PM   Taylor Medina [birth year] [id number]  Referring provider: Jacky Kindle, FNP [address] [place],  [place] [address]  Urological history: 1. ED -contributing factors of age, BPH, HTN, HLD and sleep apnea -managed with tadalafil 20 mg, on-demand-dosing  2. BPH with LU TS -PSA pending  HPI: Taylor Medina is a 57 y.o. male who presents today for yearly visit.    Previous records reviewed.   SHIM ***       Score: 1-7 Severe ED 8-11 Moderate ED 12-16 Mild-Moderate ED 17-21 Mild ED 22-25 No ED    I PSS ***      Score:  1-7 Mild 8-19 Moderate 20-35 Severe  PMH: Past Medical History:  Diagnosis Date   Diverticulosis    Erythrocytosis 01/04/2021   Guillain Barr syndrome (HCC)    High cholesterol    HTN (hypertension)    episodic    Palpitations    Sleep apnea    CPAP   Wears contact lenses     Surgical History: Past Surgical History:  Procedure Laterality Date   COLONOSCOPY WITH PROPOFOL N/A 12/10/2017   Procedure: COLONOSCOPY WITH PROPOFOL;  Surgeon: Pasty Spillers, MD;  Location: [facility] END[facility];  Service: Endoscopy;  Laterality: N/A;   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;  Service: Orthopedics;  Laterality: Left;   ESOPHAGOGASTRODUODENOSCOPY (EGD) WITH PROPOFOL  12/10/2017   Procedure: ESOPHAGOGASTRODUODENOSCOPY (EGD) WITH PROPOFOL;  Surgeon: Pasty Spillers, MD;  Location: [facility] END[facility]COPY;  Service: Endoscopy;;   FRACTURE SURGERY  2005   Rod placed.  Tib/fib fracture   hardware fractions     on R; several surgeries    hernia repair surgery      Home Medications:  Allergies as of 09/02/2023       Reactions   Influenza Vaccines    Guillain barre syndrome        Medication List        Accurate as of August 31, 2023  4:25 PM. If you have any questions, ask your nurse or doctor.           butalbital-acetaminophen-caffeine 50-325-40 MG tablet Commonly known as: FIORICET Take 1 tablet by mouth every 6 (six) hours as needed for headache.   chlorhexidine 0.12 % solution Commonly known as: PERIDEX 2 (two) times daily.   Denta 5000 Plus 1.1 % Crea dental cream Generic drug: sodium fluoride Take by mouth at bedtime.   fenofibrate 145 MG tablet Commonly known as: TRICOR TAKE 1 TABLET BY MOUTH EVERY DAY   ibuprofen 800 MG tablet Commonly known as: ADVIL Take 800 mg by mouth every 8 (eight) hours as needed.   lisinopril-hydrochlorothiazide 20-25 MG tablet Commonly known as: ZESTORETIC TAKE 1 TABLET BY MOUTH EVERY DAY   metoprolol succinate 25 MG 24 hr tablet Commonly known as: TOPROL-XL Take 1 tablet (25 mg total) by mouth daily.   pantoprazole 40 MG tablet Commonly known as: PROTONIX TAKE 1 TABLET (40 MG TOTAL) BY MOUTH TWICE A DAY BEFORE MEALS   tadalafil 5 MG tablet Commonly known as: CIALIS Take 1 tablet (5 mg total) by mouth daily as needed for erectile dysfunction.   tadalafil 20 MG tablet Commonly known as: CIALIS Take 1 tablet (20 mg total) by mouth daily as needed for erectile dysfunction.        Allergies:  Allergies  Allergen Reactions   Influenza Vaccines  Guillain barre syndrome    Family History: Family History  Problem Relation Age of Onset   Lung cancer Mother    Lung cancer Father    Cancer Other        family hx   Coronary artery disease Other        family hx   Diabetes Other        family hx   Colon cancer Brother     Social History:  reports that he has been smoking cigarettes. He started smoking about 37 years ago. He has a 18.7 pack-year smoking history. He has never used smokeless tobacco. He reports current alcohol use of about 5.0 standard drinks of alcohol per week. He reports that he does not use drugs.  ROS: For pertinent review of systems please refer to history of present illness  Physical  Exam: There were no vitals taken for this visit.  Constitutional:  Well nourished. Alert and oriented, No acute distress. HEENT: [place] AT, moist mucus membranes.  Trachea midline, no masses. Cardiovascular: No clubbing, cyanosis, or edema. Respiratory: Normal respiratory effort, no increased work of breathing. GI: Abdomen is soft, non tender, non distended, no abdominal masses. Liver and spleen not palpable.  No hernias appreciated.  Stool sample for occult testing is not indicated.   GU: No CVA tenderness.  No bladder fullness or masses.  Patient with circumcised/uncircumcised phallus. ***Foreskin easily retracted***  Urethral meatus is patent.  No penile discharge. No penile lesions or rashes. Scrotum without lesions, cysts, rashes and/or edema.  Testicles are located scrotally bilaterally. No masses are appreciated in the testicles. Left and right epididymis are normal. Rectal: Patient with  normal sphincter tone. Anus and perineum without scarring or rashes. No rectal masses are appreciated. Prostate is approximately *** grams, *** nodules are appreciated. Seminal vesicles are normal. Skin: No rashes, bruises or suspicious lesions. Lymph: No cervical or inguinal adenopathy. Neurologic: Grossly intact, no focal deficits, moving all 4 extremities. Psychiatric: Normal mood and affect.   Laboratory Data:     Latest Ref Rng & Units 04/28/2023    2:50 PM 03/23/2023    1:30 PM 07/03/2022    4:18 PM  CBC  WBC 3.4 - 10.8 x10E3/uL 9.3  7.9  9.1   Hemoglobin 13.0 - 17.7 g/dL 64.4  03.4  74.2   Hematocrit 37.5 - 51.0 % 50.1  49.2  56.1   Platelets 150 - 450 x10E3/uL 217  240  195        Latest Ref Rng & Units 04/28/2023    2:50 PM 03/23/2023    1:30 PM 07/03/2022    4:18 PM  CMP  Glucose 70 - 99 mg/dL 98  96  94   BUN 6 - 24 mg/dL 12  11  21    Creatinine 0.76 - 1.27 mg/dL 5.95  6.38  7.56   Sodium 134 - 144 mmol/L 136  133  138   Potassium 3.5 - 5.2 mmol/L 5.0  4.6  4.5   Chloride 96 - 106  mmol/L 99  100  102   CO2 20 - 29 mmol/L 23  21  20    Calcium 8.7 - 10.2 mg/dL 43.3  9.8  29.5   Total Protein 6.0 - 8.5 g/dL 7.3  7.4  7.2   Total Bilirubin 0.0 - 1.2 mg/dL 0.7  0.6  0.4   Alkaline Phos 44 - 121 IU/L 66  74  86   AST 0 - 40 IU/L 33  30  22   ALT 0 - 44 IU/L 53  58  33   I have reviewed the labs.  Pertinent Imaging N/A  Assessment & Plan:    1. Erectile dysfunction -at goal  -tadalafil 20 mg  2. BPH with LUTS -PSA stable -DRE benign  -symptoms - post-void dribbling  -continue conservative management, avoiding bladder irritants and timed voiding's -take tadalafil 5 mg daily    Michiel Cowboy, PA-C  [facility] [address], [address] [place], [place] [address] [phone]

## 2023-09-02 ENCOUNTER — Ambulatory Visit: Payer: BC Managed Care – PPO | Admitting: Urology

## 2023-09-02 DIAGNOSIS — N401 Enlarged prostate with lower urinary tract symptoms: Secondary | ICD-10-CM

## 2023-09-02 DIAGNOSIS — N5201 Erectile dysfunction due to arterial insufficiency: Secondary | ICD-10-CM

## 2023-09-29 ENCOUNTER — Other Ambulatory Visit: Payer: Self-pay | Admitting: Family Medicine

## 2023-09-29 DIAGNOSIS — I10 Essential (primary) hypertension: Secondary | ICD-10-CM

## 2023-09-30 NOTE — Telephone Encounter (Signed)
Rx 06/03/23 #90 3RF- too soon Requested Prescriptions  Pending Prescriptions Disp Refills   lisinopril-hydrochlorothiazide (ZESTORETIC) 20-25 MG tablet [Pharmacy Med Name: LISINOPRIL-HCTZ 20-25 MG TAB] 90 tablet 4    Sig: TAKE 1 TABLET BY MOUTH EVERY DAY     Cardiovascular:  ACEI + Diuretic Combos Failed - 09/30/2023  9:39 AM      Failed - Last BP in normal range    BP Readings from Last 1 Encounters:  04/28/23 (!) 138/91         Passed - Na in normal range and within 180 days    Sodium  Date Value Ref Range Status  04/28/2023 136 134 - 144 mmol/L Final  01/31/2015 137 mmol/L Final    Comment:    135-145 NOTE: New Reference Range  12/19/14          Passed - K in normal range and within 180 days    Potassium  Date Value Ref Range Status  04/28/2023 5.0 3.5 - 5.2 mmol/L Final  01/31/2015 4.5 mmol/L Final    Comment:    3.5-5.1 NOTE: New Reference Range  12/19/14          Passed - Cr in normal range and within 180 days    Creatinine  Date Value Ref Range Status  01/31/2015 1.18 mg/dL Final    Comment:    1.32-4.40 NOTE: New Reference Range  12/19/14    Creatinine, Ser  Date Value Ref Range Status  04/28/2023 1.03 0.76 - 1.27 mg/dL Final         Passed - eGFR is 30 or above and within 180 days    EGFR (African American)  Date Value Ref Range Status  01/31/2015 >60  Final   GFR calc Af Amer  Date Value Ref Range Status  12/06/2020 106 >59 mL/min/1.73 Final    Comment:    **In accordance with recommendations from the NKF-ASN Task force,**   [facility] is in the process of updating its eGFR calculation to the   2021 CKD-EPI creatinine equation that estimates kidney function   without a race variable.    EGFR (Non-African Amer.)  Date Value Ref Range Status  01/31/2015 >60  Final    Comment:    eGFR values <66mL/min/1.73 m2 may be an indication of chronic kidney disease (CKD). Calculated eGFR is useful in patients with stable renal function. The eGFR  calculation will not be reliable in acutely ill patients when serum creatinine is changing rapidly. It is not useful in patients on dialysis. The eGFR calculation may not be applicable to patients at the low and high extremes of body sizes, pregnant women, and vegetarians.    GFR, Estimated  Date Value Ref Range Status  05/23/2021 >60 >60 mL/min Final    Comment:    (NOTE) Calculated using the CKD-EPI Creatinine Equation (2021)    eGFR  Date Value Ref Range Status  04/28/2023 86 >59 mL/min/1.73 Final         Passed - Patient is not pregnant      Passed - Valid encounter within last 6 months    Recent Outpatient Visits           5 months ago Colicky LLQ abdominal pain   [facility] [place], [place], PA-C   6 months ago Colicky RUQ abdominal pain   [facility] Merita Norton [place], [place]   1 year ago HYPERTENSION, BENIGN   [facility] Jacky Kindle,  FNP   1 year ago Annual physical exam   [facility] Jacky Kindle, FNP   1 year ago HYPERTENSION, BENIGN   [place] [facility] Jacky Kindle, [place]

## 2023-10-09 ENCOUNTER — Ambulatory Visit: Payer: BC Managed Care – PPO | Admitting: Family Medicine

## 2023-10-11 ENCOUNTER — Other Ambulatory Visit: Payer: Self-pay | Admitting: Family Medicine

## 2023-10-11 DIAGNOSIS — I10 Essential (primary) hypertension: Secondary | ICD-10-CM

## 2023-10-22 ENCOUNTER — Other Ambulatory Visit: Payer: Self-pay | Admitting: Family Medicine

## 2023-11-02 ENCOUNTER — Encounter: Payer: Self-pay | Admitting: Family Medicine

## 2023-11-02 ENCOUNTER — Ambulatory Visit: Payer: BC Managed Care – PPO | Admitting: Family Medicine

## 2023-11-02 VITALS — BP 103/81 | HR 63 | Temp 97.8°F | Resp 16 | Ht 69.0 in | Wt 169.7 lb

## 2023-11-02 DIAGNOSIS — G43009 Migraine without aura, not intractable, without status migrainosus: Secondary | ICD-10-CM | POA: Diagnosis not present

## 2023-11-02 DIAGNOSIS — R61 Generalized hyperhidrosis: Secondary | ICD-10-CM | POA: Diagnosis not present

## 2023-11-02 DIAGNOSIS — B353 Tinea pedis: Secondary | ICD-10-CM

## 2023-11-02 DIAGNOSIS — I10 Essential (primary) hypertension: Secondary | ICD-10-CM | POA: Diagnosis not present

## 2023-11-02 DIAGNOSIS — Z125 Encounter for screening for malignant neoplasm of prostate: Secondary | ICD-10-CM | POA: Diagnosis not present

## 2023-11-02 DIAGNOSIS — E785 Hyperlipidemia, unspecified: Secondary | ICD-10-CM

## 2023-11-02 DIAGNOSIS — L299 Pruritus, unspecified: Secondary | ICD-10-CM

## 2023-11-02 DIAGNOSIS — R631 Polydipsia: Secondary | ICD-10-CM

## 2023-11-02 MED ORDER — BUTALBITAL-APAP-CAFFEINE 50-325-40 MG PO TABS: 1.0000 | ORAL_TABLET | Freq: Four times a day (QID) | ORAL | 0 refills | Status: DC | PRN

## 2023-11-02 NOTE — Assessment & Plan Note (Signed)
Intermittent concerns with hesitancy with stream Historically no concerns with prostate and normal PSA Will check PSA today

## 2023-11-02 NOTE — Assessment & Plan Note (Signed)
Increase thirst and urination, along with night sweats Fam hx of DMII, but no personal hx  Will check A1C today for r/o DM concern

## 2023-11-02 NOTE — Assessment & Plan Note (Signed)
Concerns for interested night sweats, and slow, unintentional weight loss of 10lbs over last 6 months Denies exposures to infections and denies need for STI testing today Will check TSH and A1C today

## 2023-11-02 NOTE — Assessment & Plan Note (Signed)
States itch on upper forehead R side Physical unremarkable, no evidence of lesion May use OTC hydrocortisone cream to urticaric area

## 2023-11-02 NOTE — Assessment & Plan Note (Signed)
R great toe, thickening and discoloration of nai Appears funal in appear May use OTC lamisil to toe nail May warm soak in salt water Clean feet dry, clean socks and shoes Decrease moisture

## 2023-11-02 NOTE — Assessment & Plan Note (Signed)
Chronic, historically stable without medication use  Will check lipid panel while fasting PT to come back to clinic for lab draw while he is fasting for best results

## 2023-11-02 NOTE — Assessment & Plan Note (Signed)
Chronic, Stable Infrequent episodes, managed with Fioricet as needed. -Refill Fioricet prescription.

## 2023-11-02 NOTE — Assessment & Plan Note (Signed)
Chronic, stable BP today 103/81 Goal SBP<130; Dbp<80 Continue both metoprolol 25 and zestoretic 20-25 No edema noted Encouraged to stop smoking; has quit before; remains precontemplative  Will check CMP today

## 2023-11-02 NOTE — Progress Notes (Signed)
Established Patient Office Visit  Introduced to nurse practitioner role and practice setting.  All questions answered.  Discussed provider/patient relationship and expectations.   Subjective   Patient ID: Taylor Medina, male    DOB: May 12, 1966  Age: 58 y.o. MRN: [id number]  Chief Complaint  Patient presents with   Follow-up    Swelling in neck and chin for a week and low grade fever - denies any symptoms now Spot on forehead super itchy  for a few weeks all the time Requesting labwork Foircet needs refill   Pt presents for lab work and chronic disease mgmt with a history of migraines, hypertension, and GERD.   Migraines - eight per year. Managed with as needed Fiorcet. Needs refill today.The frequency of migraines has decreased since the initiation of blood pressure medication.  Change in appetite, with a loss of interest in food halfway through meals, leading to an unintentional weight loss of 10 pounds over the past six months. Also have had increase in night sweats, concerns for hormonal changes. The patient has a family history of diabetes and has been experiencing increased thirst and dry mouth.  Feels he has a tinge of yellow in her skin, states hx of elevated liver enzymes, but denies ETOH use. Denies any exposure to infections, other than traveling to [place]. Approximately a month ago after [place] travel, the patient experienced swollen glands on both sides of the jaw, accompanied by a fever of around 100 degrees Fahrenheit for about a week. The symptoms have since resolved  Itchy spot on R upper forehead:, with no visible lesions or rashes.  States increase in frequency and mild hesistency with urination intermittently.  HTN - controlled on Zestoretic 20/25mg  and metoprolol 25 mg daily        11/02/2023    3:37 PM 04/28/2023    1:26 PM 03/23/2023    1:11 PM  Depression screen PHQ 2/9  Decreased Interest 0 0 0  Down, Depressed, Hopeless 0 0 0  PHQ - 2 Score 0 0 0   Altered sleeping 0 0 0  Tired, decreased energy 0 0 0  Change in appetite 0 0 0  Feeling bad or failure about yourself  0 0 0  Trouble concentrating 0 0 0  Moving slowly or fidgety/restless 0 0 0  Suicidal thoughts 0 0 0  PHQ-9 Score 0 0 0  Difficult doing work/chores Not difficult at all Not difficult at all Not difficult at all       04/28/2023    1:26 PM  GAD 7 : Generalized Anxiety Score  Nervous, Anxious, on Edge 0  Control/stop worrying 0  Worry too much - different things 0  Trouble relaxing 0  Restless 0  Easily annoyed or irritable 0  Afraid - awful might happen 0  Total GAD 7 Score 0     ROS  Negative unless indicated in HPI   Objective:     BP 103/81 (BP Location: Left Arm, Patient Position: Sitting, Cuff Size: Normal)   Pulse 63   Temp 97.8 F (36.6 C)   Resp 16   Ht 5\' 9"  (1.753 m)   Wt 169 lb 11.2 oz (77 kg)   SpO2 96%   BMI 25.06 kg/m    Physical Exam Constitutional:      General: He is not in acute distress.    Appearance: Normal appearance. He is not ill-appearing, toxic-appearing or diaphoretic.  HENT:     Head: Normocephalic.  Nose: Nose normal.     Mouth/Throat:     Mouth: Mucous membranes are moist.  Eyes:     Extraocular Movements: Extraocular movements intact.     Conjunctiva/sclera: Conjunctivae normal.     Pupils: Pupils are equal, round, and reactive to light.  Cardiovascular:     Rate and Rhythm: Normal rate and regular rhythm.     Pulses:          Dorsalis pedis pulses are 2+ on the right side and 2+ on the left side.       Posterior tibial pulses are 2+ on the right side and 2+ on the left side.     Heart sounds: No murmur heard.    No friction rub. No gallop.  Pulmonary:     Effort: Pulmonary effort is normal. No respiratory distress.     Breath sounds: Normal breath sounds. No stridor. No wheezing, rhonchi or rales.  Chest:     Chest wall: No tenderness.  Musculoskeletal:     Cervical back: No rigidity or  tenderness.     Right lower leg: No edema.     Left lower leg: No edema.  Feet:     Right foot:     Skin integrity: Skin integrity normal.     Toenail Condition: Fungal disease present.    Left foot:     Skin integrity: Skin integrity normal.     Toenail Condition: Left toenails are normal.  Lymphadenopathy:     Cervical: No cervical adenopathy.  Skin:    General: Skin is warm and dry.     Capillary Refill: Capillary refill takes less than 2 seconds.     Coloration: Skin is not jaundiced or pale.     Findings: No bruising, erythema, lesion or rash.     Comments: Pt concerned for yellow tinge to skin, pt appears tan during exam  Neurological:     General: No focal deficit present.     Mental Status: He is alert and oriented to person, place, and time. Mental status is at baseline.     Cranial Nerves: No cranial nerve deficit.     Sensory: No sensory deficit.     Motor: No weakness.     Coordination: Coordination normal.     Gait: Gait normal.  Psychiatric:        Mood and Affect: Mood normal.        Behavior: Behavior normal.        Thought Content: Thought content normal.        Judgment: Judgment normal.      No results found for any visits on 11/02/23.    The 10-year ASCVD risk score (Arnett DK, et al., 2019) is: 9.7%    Assessment & Plan:  Migraine without aura and without status migrainosus, not intractable Assessment & Plan: Chronic, Stable Infrequent episodes, managed with Fioricet as needed. -Refill Fioricet prescription.  Orders: -     Butalbital-APAP-Caffeine; Take 1 tablet by mouth every 6 (six) hours as needed for headache.  Dispense: 14 tablet; Refill: 0  Screening for prostate cancer Assessment & Plan: Intermittent concerns with hesitancy with stream Historically no concerns with prostate and normal PSA Will check PSA today  Orders: -     PSA  Night sweats Assessment & Plan: Concerns for interested night sweats, and slow, unintentional  weight loss of 10lbs over last 6 months Denies exposures to infections and denies need for STI testing today Will check TSH and A1C today  Orders: -     CBC with Differential/Platelet -     TSH + free T4 -     Hemoglobin A1c  Primary hypertension Assessment & Plan: Chronic, stable BP today 103/81 Goal SBP<130; Dbp<80 Continue both metoprolol 25 and zestoretic 20-25 No edema noted Encouraged to stop smoking; has quit before; remains precontemplative  Will check CMP today    Orders: -     Comprehensive metabolic panel  Increased thirst Assessment & Plan: Increase thirst and urination, along with night sweats Fam hx of DMII, but no personal hx  Will check A1C today for r/o DM concern  Orders: -     Hemoglobin A1c  Hyperlipidemia, unspecified hyperlipidemia type Assessment & Plan: Chronic, historically stable without medication use  Will check lipid panel while fasting PT to come back to clinic for lab draw while he is fasting for best results  Orders: -     Lipid panel; Future  Tinea pedis of right foot Assessment & Plan: R great toe, thickening and discoloration of nai Appears funal in appear May use OTC lamisil to toe nail May warm soak in salt water Clean feet dry, clean socks and shoes Decrease moisture    Itch of skin Assessment & Plan: States itch on upper forehead R side Physical unremarkable, no evidence of lesion May use OTC hydrocortisone cream to urticaric area     Will communicate labs.  Return in about 6 months (around 05/01/2024) for annual physical.   I, Sallee Provencal, FNP, have reviewed all documentation for this visit. The documentation on 11/02/23 for the exam, diagnosis, procedures, and orders are all accurate and complete.   Sallee Provencal, FNP

## 2023-11-03 ENCOUNTER — Encounter: Payer: Self-pay | Admitting: Family Medicine

## 2023-11-03 LAB — CBC WITH DIFFERENTIAL/PLATELET
Basophils Absolute: 0 10*3/uL (ref 0.0–0.2)
Basos: 0 %
EOS (ABSOLUTE): 0.2 10*3/uL (ref 0.0–0.4)
Eos: 3 %
Hematocrit: 51 % (ref 37.5–51.0)
Hemoglobin: 17.4 g/dL (ref 13.0–17.7)
Immature Grans (Abs): 0 10*3/uL (ref 0.0–0.1)
Immature Granulocytes: 0 %
Lymphocytes Absolute: 1.7 10*3/uL (ref 0.7–3.1)
Lymphs: 21 %
MCH: 31.2 pg (ref 26.6–33.0)
MCHC: 34.1 g/dL (ref 31.5–35.7)
MCV: 91 fL (ref 79–97)
Monocytes Absolute: 0.7 10*3/uL (ref 0.1–0.9)
Monocytes: 9 %
Neutrophils Absolute: 5.7 10*3/uL (ref 1.4–7.0)
Neutrophils: 67 %
Platelets: 266 10*3/uL (ref 150–450)
RBC: 5.58 x10E6/uL (ref 4.14–5.80)
RDW: 12.5 % (ref 11.6–15.4)
WBC: 8.4 10*3/uL (ref 3.4–10.8)

## 2023-11-03 LAB — COMPREHENSIVE METABOLIC PANEL
ALT: 38 [IU]/L (ref 0–44)
AST: 23 [IU]/L (ref 0–40)
Albumin: 4.6 g/dL (ref 3.8–4.9)
Alkaline Phosphatase: 69 [IU]/L (ref 44–121)
BUN/Creatinine Ratio: 12 (ref 9–20)
BUN: 17 mg/dL (ref 6–24)
Bilirubin Total: 0.6 mg/dL (ref 0.0–1.2)
CO2: 22 mmol/L (ref 20–29)
Calcium: 10.3 mg/dL — ABNORMAL HIGH (ref 8.7–10.2)
Chloride: 104 mmol/L (ref 96–106)
Creatinine, Ser: 1.37 mg/dL — ABNORMAL HIGH (ref 0.76–1.27)
Globulin, Total: 2.7 g/dL (ref 1.5–4.5)
Glucose: 98 mg/dL (ref 70–99)
Potassium: 4.2 mmol/L (ref 3.5–5.2)
Sodium: 144 mmol/L (ref 134–144)
Total Protein: 7.3 g/dL (ref 6.0–8.5)
eGFR: 61 mL/min/{1.73_m2} (ref >59)

## 2023-11-03 LAB — TSH+FREE T4
Free T4: 0.71 ng/dL — ABNORMAL LOW (ref 0.82–1.77)
TSH: 2 u[IU]/mL (ref 0.450–4.500)

## 2023-11-03 LAB — HEMOGLOBIN A1C
Est. average glucose Bld gHb Est-mCnc: 105 mg/dL
Hgb A1c MFr Bld: 5.3 % (ref 4.8–5.6)

## 2023-11-03 LAB — PSA: Prostate Specific Ag, Serum: 0.3 ng/mL (ref 0.0–4.0)

## 2023-11-06 ENCOUNTER — Ambulatory Visit: Payer: BC Managed Care – PPO | Admitting: Physician Assistant

## 2023-11-06 ENCOUNTER — Encounter: Payer: Self-pay | Admitting: Physician Assistant

## 2023-11-06 VITALS — BP 126/78 | HR 86 | Temp 99.0°F | Resp 16 | Ht 69.0 in | Wt 170.0 lb

## 2023-11-06 DIAGNOSIS — R5383 Other fatigue: Secondary | ICD-10-CM

## 2023-11-06 DIAGNOSIS — R6889 Other general symptoms and signs: Secondary | ICD-10-CM

## 2023-11-06 DIAGNOSIS — J069 Acute upper respiratory infection, unspecified: Secondary | ICD-10-CM

## 2023-11-06 DIAGNOSIS — R509 Fever, unspecified: Secondary | ICD-10-CM

## 2023-11-06 LAB — POCT INFLUENZA A/B
Influenza A, POC: NEGATIVE
Influenza B, POC: NEGATIVE

## 2023-11-06 NOTE — Progress Notes (Signed)
Acute Office Visit   Patient: Taylor Medina   DOB: 1965/10/19   58 y.o. Male  MRN: [id number] Visit Date: 11/06/2023  Today's healthcare provider: Oswaldo Conroy Mariha Sleeper, PA-C  Introduced myself to the patient as a Secondary school teacher and provided education on APPs in clinical practice.    Chief Complaint  Patient presents with   Cough    Productive, x3 days   Nasal Congestion    OTC meds not helping, feels like it's worsening.    Headache   Subjective    HPI HPI     Cough    Additional comments: Productive, x3 days        Nasal Congestion    Additional comments: OTC meds not helping, feels like it's worsening.       Last edited by Dollene Primrose, CMA on 11/06/2023  8:55 AM.      Discussed the use of AI scribe software for clinical note transcription with the patient, who gave verbal consent to proceed.  History of Present Illness   The patient, with an unspecified medical history, began experiencing symptoms of an upper respiratory infection approximately three days prior to the consultation. He reports a constellation of symptoms including headache, nasal congestion, sneezing, chest congestion, and productive cough with yellow phlegm. The patient also notes experiencing fever, with a peak temperature of 101.74F, and general malaise with alternating hot and cold sensations.  The patient describes a sensation of drainage down the back of his throat and sinus pressure, but denies any sore throat. He reports ear discomfort, describing it as stuffiness and difficulty equalizing pressure, occasionally resulting in mild pain. The patient also notes shortness of breath and wheezing, with oxygen saturation dropping to 90% at its lowest. He denies any gastrointestinal symptoms such as nausea, vomiting, or diarrhea, but mentions increased gas production.  The patient denies recent exposure to sick individuals but reports recent travel to [place] a week prior to the onset of symptoms. He has been  managing his symptoms with over-the-counter medications including DayQuil, Nyquil, Ipsifed, and ibuprofen. The patient also had a general check-up unrelated to the current illness the day before the onset of symptoms.       Medications: Outpatient Medications Prior to Visit  Medication Sig   butalbital-acetaminophen-caffeine (FIORICET) 50-325-40 MG tablet Take 1 tablet by mouth every 6 (six) hours as needed for headache.   ibuprofen (ADVIL) 800 MG tablet Take 800 mg by mouth every 8 (eight) hours as needed.   lisinopril-hydrochlorothiazide (ZESTORETIC) 20-25 MG tablet TAKE 1 TABLET BY MOUTH EVERY DAY   metoprolol succinate (TOPROL-XL) 25 MG 24 hr tablet TAKE 1 TABLET (25 MG TOTAL) BY MOUTH DAILY.   pantoprazole (PROTONIX) 40 MG tablet TAKE 1 TABLET (40 MG TOTAL) BY MOUTH TWICE A DAY BEFORE MEALS   tadalafil (CIALIS) 5 MG tablet Take 1 tablet (5 mg total) by mouth daily as needed for erectile dysfunction.   No facility-administered medications prior to visit.    Review of Systems  Constitutional:  Positive for chills, fatigue and fever.  HENT:  Positive for congestion, postnasal drip and sinus pressure. Negative for ear pain and sore throat.        Aural fullness   Respiratory:  Positive for cough and wheezing.   Gastrointestinal:  Negative for diarrhea, nausea and vomiting.  Musculoskeletal:  Positive for myalgias.  Neurological:  Positive for headaches.        Objective    BP 126/78  Pulse 86   Temp 99 F (37.2 C) (Oral)   Resp 16   Ht 5\' 9"  (1.753 m)   Wt 170 lb (77.1 kg)   SpO2 99%   BMI 25.10 kg/m     Physical Exam Vitals reviewed.  Constitutional:      General: He is awake.     Appearance: Normal appearance. He is well-developed and well-groomed.  HENT:     Head: Normocephalic and atraumatic.     Right Ear: Hearing, tympanic membrane and ear canal normal.     Left Ear: Hearing, tympanic membrane and ear canal normal.     Mouth/Throat:     Lips: Pink.      Pharynx: Oropharynx is clear. Uvula midline. Posterior oropharyngeal erythema and postnasal drip present. No oropharyngeal exudate or uvula swelling.  Pulmonary:     Effort: Pulmonary effort is normal.     Breath sounds: Normal breath sounds. No decreased air movement. No decreased breath sounds, wheezing, rhonchi or rales.  Musculoskeletal:     Cervical back: Normal range of motion and neck supple.  Lymphadenopathy:     Head:     Right side of head: No submental, submandibular or preauricular adenopathy.     Left side of head: No submental, submandibular or preauricular adenopathy.     Cervical:     Right cervical: No superficial cervical adenopathy.    Left cervical: No superficial cervical adenopathy.     Upper Body:     Right upper body: No supraclavicular adenopathy.     Left upper body: No supraclavicular adenopathy.  Skin:    General: Skin is warm and dry.  Neurological:     Mental Status: He is alert.  Psychiatric:        Mood and Affect: Mood normal.        Behavior: Behavior normal. Behavior is cooperative.       Results for orders placed or performed in visit on 11/06/23  POCT Influenza A/B  Result Value Ref Range   Influenza A, POC Negative Negative   Influenza B, POC Negative Negative    Assessment & Plan      No follow-ups on file.     Problem List Items Addressed This Visit   None Visit Diagnoses       Viral upper respiratory tract infection    -  Primary     Flu-like symptoms       Relevant Orders   POCT Influenza A/B (Completed)   Novel Coronavirus, NAA ([facility])      Assessment and Plan    Upper Respiratory Infection (URI) Presents with headache, sneezing, chest congestion, productive cough with yellow phlegm, fever (101.75F), sinus pressure, ear stuffiness, and wheezing. Symptoms began three days ago. Recently traveled to [place] and scheduled to return in two weeks. Flu test is negative, and COVID-19 test results are pending. Clinical  presentation suggests a viral etiology, likely a URI. Managing symptoms with DayQuil, Nyquil, Ipsifed, and ibuprofen. Discussed potential Paxlovid if COVID-19 test is positive. Explained Tamiflu is most effective within the first 72 hours of flu symptoms onset, which has passed. Advised further evaluation if symptoms worsen or do not improve in 5-7 days. - Continue DayQuil, Nyquil, Ipsifed, and ibuprofen - Perform home COVID-19 test and inform the clinic if positive for potential Paxlovid prescription - Monitor symptoms and contact the clinic if symptoms worsen or do not improve in 5-7 days    Follow-up - Check MyChart for COVID-19 test results - Contact  the clinic if symptoms worsen or do not improve in 5-7 days.        No follow-ups on file.   I, Litzy Dicker E Yer Castello, PA-C, have reviewed all documentation for this visit. The documentation on 11/06/23 for the exam, diagnosis, procedures, and orders are all accurate and complete.   Jacquelin Hawking, MHS, PA-C [facility]

## 2023-11-06 NOTE — Patient Instructions (Signed)
VISIT SUMMARY:  You were seen today for symptoms of an upper respiratory infection that started three days ago. Your symptoms include headache, nasal congestion, sneezing, chest congestion, productive cough with yellow phlegm, fever, sinus pressure, ear stuffiness, and wheezing. You recently traveled to [place] and have been managing your symptoms with over-the-counter medications.  YOUR PLAN:  -UPPER RESPIRATORY INFECTION (URI): An upper respiratory infection is a viral infection that affects the nose, throat, and airways. You are experiencing symptoms such as headache, sneezing, chest congestion, productive cough, fever, sinus pressure, ear stuffiness, and wheezing. Continue taking DayQuil, Nyquil, Ipsifed, and ibuprofen as needed. Perform a home COVID-19 test and inform the clinic if it is positive for a potential Paxlovid prescription. Monitor your symptoms and contact the clinic if they worsen or do not improve in 5-7 days.    INSTRUCTIONS:  Check MyChart for your COVID-19 test results. Contact the clinic if your symptoms worsen or do not improve in 5-7 days.

## 2023-11-09 LAB — NOVEL CORONAVIRUS, NAA: SARS-CoV-2, NAA: NOT DETECTED

## 2023-11-10 NOTE — Progress Notes (Signed)
COVID testing negative

## 2023-11-17 ENCOUNTER — Other Ambulatory Visit: Payer: Self-pay | Admitting: Family Medicine

## 2023-11-19 ENCOUNTER — Encounter: Payer: Self-pay | Admitting: Internal Medicine

## 2023-11-19 ENCOUNTER — Ambulatory Visit: Payer: BC Managed Care – PPO | Admitting: Internal Medicine

## 2023-11-19 ENCOUNTER — Other Ambulatory Visit: Payer: Self-pay

## 2023-11-19 VITALS — BP 110/78 | HR 77 | Temp 98.9°F | Resp 16 | Ht 69.0 in | Wt 176.2 lb

## 2023-11-19 DIAGNOSIS — H6501 Acute serous otitis media, right ear: Secondary | ICD-10-CM | POA: Diagnosis not present

## 2023-11-19 DIAGNOSIS — H6991 Unspecified Eustachian tube disorder, right ear: Secondary | ICD-10-CM

## 2023-11-19 MED ORDER — AMOXICILLIN 875 MG PO TABS: 875.0000 mg | ORAL_TABLET | Freq: Two times a day (BID) | ORAL | 0 refills | Status: AC

## 2023-11-19 MED ORDER — METHYLPREDNISOLONE 4 MG PO TBPK: ORAL_TABLET | ORAL | 0 refills | Status: DC

## 2023-11-19 NOTE — Progress Notes (Signed)
 Acute Office Visit  Subjective:     Patient ID: Taylor Medina, male    DOB: 05-Dec-1965, 58 y.o.   MRN: [id number]  Chief Complaint  Patient presents with   Otalgia    Right ear pain for 4 days    HPI Patient is in today for right ear pain x 4 days. Patient is concerned because he is flying to [place] for work on Sunday and the pain is severe.   EAR PAIN Duration:  4 days Involved ear(s): right Severity:  severe  Quality:  pressure-like Fever: no Otorrhea: no Upper respiratory infection symptoms: yes, had a URI a few weeks ago, those symptoms have resolved but now he has the ear pain Pruritus: no Hearing loss: yes Water immersion no Using Q-tips: yes, using peroxide to clean ears Recurrent otitis media: no Status: better and fluctuating Treatments attempted:  started some old Amoxicillin  he had at his house 2 days ago, symptoms have slightly improved    Review of Systems  Constitutional:  Negative for chills and fever.  HENT:  Positive for ear pain, hearing loss and tinnitus. Negative for congestion, ear discharge, sinus pain and sore throat.   Respiratory:  Negative for cough, shortness of breath and wheezing.   Cardiovascular:  Negative for chest pain.  Neurological:  Negative for headaches.        Objective:    BP 110/78 (Cuff Size: Large)   Pulse 77   Temp 98.9 F (37.2 C) (Oral)   Resp 16   Ht 5' 9 (1.753 m)   Wt 176 lb 3.2 oz (79.9 kg)   SpO2 98%   BMI 26.02 kg/m  BP Readings from Last 3 Encounters:  11/19/23 110/78  11/06/23 126/78  11/02/23 103/81   Wt Readings from Last 3 Encounters:  11/19/23 176 lb 3.2 oz (79.9 kg)  11/06/23 170 lb (77.1 kg)  11/02/23 169 lb 11.2 oz (77 kg)      Physical Exam Constitutional:      Appearance: Normal appearance.  HENT:     Head: Normocephalic and atraumatic.     Right Ear: External ear normal.     Left Ear: Tympanic membrane, ear canal and external ear normal.     Ears:     Comments: Ear canal  erythematous with opaque TM but with eustachian tube dysfunction and fluid behind the ears. No visible purulent material Eyes:     Conjunctiva/sclera: Conjunctivae normal.  Cardiovascular:     Rate and Rhythm: Normal rate and regular rhythm.  Pulmonary:     Effort: Pulmonary effort is normal.     Breath sounds: Normal breath sounds.  Skin:    General: Skin is warm and dry.  Neurological:     General: No focal deficit present.     Mental Status: He is alert. Mental status is at baseline.  Psychiatric:        Mood and Affect: Mood normal.        Behavior: Behavior normal.     No results found for any visits on 11/19/23.      Assessment & Plan:   1. Non-recurrent acute serous otitis media of right ear (Primary)/Dysfunction of right eustachian tube: Erythema and fluid behind ear, will treat as a serous otitis media because he is traveling and already started antibiotics. Will prescribe Amoxicillin  x 5 days and treat with medrol  dose pack as well to help with pressure symptoms. Stop using q-tips and peroxide in the ear. Can also use nasal  steroids, especially when flying. Follow up if symptoms worsen or fail to improve.   - methylPREDNISolone  (MEDROL  DOSEPAK) 4 MG TBPK tablet; Use as directed.  Dispense: 21 each; Refill: 0 - amoxicillin  (AMOXIL ) 875 MG tablet; Take 1 tablet (875 mg total) by mouth 2 (two) times daily for 5 days.  Dispense: 10 tablet; Refill: 0  Return if symptoms worsen or fail to improve.  Sharyle Fischer, DO

## 2024-01-11 ENCOUNTER — Encounter: Payer: Self-pay | Admitting: Physician Assistant

## 2024-01-11 ENCOUNTER — Telehealth: Payer: Self-pay | Admitting: Physician Assistant

## 2024-01-11 DIAGNOSIS — E785 Hyperlipidemia, unspecified: Secondary | ICD-10-CM

## 2024-01-11 MED ORDER — FENOFIBRATE 48 MG PO TABS: 48.0000 mg | ORAL_TABLET | Freq: Every day | ORAL | 1 refills | Status: DC

## 2024-01-11 NOTE — Telephone Encounter (Signed)
 [facility] is requesting new prescription FENOFIBRATE 145 MG tablet Please advise

## 2024-02-11 ENCOUNTER — Other Ambulatory Visit: Payer: Self-pay | Admitting: Physician Assistant

## 2024-02-11 DIAGNOSIS — E785 Hyperlipidemia, unspecified: Secondary | ICD-10-CM

## 2024-04-18 ENCOUNTER — Telehealth: Payer: Self-pay | Admitting: Physician Assistant

## 2024-04-18 DIAGNOSIS — I10 Essential (primary) hypertension: Secondary | ICD-10-CM

## 2024-04-18 NOTE — Telephone Encounter (Signed)
 [facility] is requesting refill lisinopril-hydrochlorothiazide (ZESTORETIC) 20-25 MG tablet  Please advise

## 2024-04-20 MED ORDER — LISINOPRIL-HYDROCHLOROTHIAZIDE 20-25 MG PO TABS: 1.0000 | ORAL_TABLET | Freq: Every day | ORAL | 3 refills | Status: AC

## 2024-05-02 ENCOUNTER — Ambulatory Visit: Payer: Self-pay | Admitting: Physician Assistant

## 2024-05-02 ENCOUNTER — Encounter: Payer: Self-pay | Admitting: Physician Assistant

## 2024-05-02 VITALS — BP 112/87 | HR 66 | Resp 16 | Ht 69.0 in | Wt 168.0 lb

## 2024-05-02 DIAGNOSIS — E785 Hyperlipidemia, unspecified: Secondary | ICD-10-CM | POA: Diagnosis not present

## 2024-05-02 DIAGNOSIS — Z720 Tobacco use: Secondary | ICD-10-CM

## 2024-05-02 DIAGNOSIS — G43009 Migraine without aura, not intractable, without status migrainosus: Secondary | ICD-10-CM

## 2024-05-02 DIAGNOSIS — T888XXA Other specified complications of surgical and medical care, not elsewhere classified, initial encounter: Secondary | ICD-10-CM

## 2024-05-02 DIAGNOSIS — I10 Essential (primary) hypertension: Secondary | ICD-10-CM

## 2024-05-02 DIAGNOSIS — M722 Plantar fascial fibromatosis: Secondary | ICD-10-CM

## 2024-05-03 ENCOUNTER — Ambulatory Visit: Payer: Self-pay | Admitting: Physician Assistant

## 2024-05-03 LAB — LIPID PANEL
Chol/HDL Ratio: 6.2 ratio — ABNORMAL HIGH (ref 0.0–5.0)
Cholesterol, Total: 217 mg/dL — ABNORMAL HIGH (ref 100–199)
HDL: 35 mg/dL — ABNORMAL LOW (ref >39)
LDL Chol Calc (NIH): 75 mg/dL (ref 0–99)
Triglycerides: 677 mg/dL (ref 0–149)
VLDL Cholesterol Cal: 107 mg/dL — ABNORMAL HIGH (ref 5–40)

## 2024-05-03 LAB — CBC WITH DIFFERENTIAL/PLATELET
Basophils Absolute: 0 x10E3/uL (ref 0.0–0.2)
Basos: 0 %
EOS (ABSOLUTE): 0.3 x10E3/uL (ref 0.0–0.4)
Eos: 3 %
Hematocrit: 53.1 % — ABNORMAL HIGH (ref 37.5–51.0)
Hemoglobin: 17.7 g/dL (ref 13.0–17.7)
Immature Grans (Abs): 0 x10E3/uL (ref 0.0–0.1)
Immature Granulocytes: 0 %
Lymphocytes Absolute: 2.5 x10E3/uL (ref 0.7–3.1)
Lymphs: 25 %
MCH: 30.7 pg (ref 26.6–33.0)
MCHC: 33.3 g/dL (ref 31.5–35.7)
MCV: 92 fL (ref 79–97)
Monocytes Absolute: 0.7 x10E3/uL (ref 0.1–0.9)
Monocytes: 7 %
Neutrophils Absolute: 6.6 x10E3/uL (ref 1.4–7.0)
Neutrophils: 65 %
Platelets: 258 x10E3/uL (ref 150–450)
RBC: 5.76 x10E6/uL (ref 4.14–5.80)
RDW: 12.3 % (ref 11.6–15.4)
WBC: 10.1 x10E3/uL (ref 3.4–10.8)

## 2024-05-03 LAB — COMPREHENSIVE METABOLIC PANEL WITH GFR
ALT: 33 IU/L (ref 0–44)
AST: 26 IU/L (ref 0–40)
Albumin: 4.6 g/dL (ref 3.8–4.9)
Alkaline Phosphatase: 102 IU/L (ref 44–121)
BUN/Creatinine Ratio: 14 (ref 9–20)
BUN: 18 mg/dL (ref 6–24)
Bilirubin Total: 0.5 mg/dL (ref 0.0–1.2)
CO2: 19 mmol/L — ABNORMAL LOW (ref 20–29)
Calcium: 9.9 mg/dL (ref 8.7–10.2)
Chloride: 102 mmol/L (ref 96–106)
Creatinine, Ser: 1.32 mg/dL — ABNORMAL HIGH (ref 0.76–1.27)
Globulin, Total: 2.7 g/dL (ref 1.5–4.5)
Glucose: 114 mg/dL — ABNORMAL HIGH (ref 70–99)
Potassium: 4.3 mmol/L (ref 3.5–5.2)
Sodium: 137 mmol/L (ref 134–144)
Total Protein: 7.3 g/dL (ref 6.0–8.5)
eGFR: 63 mL/min/1.73 (ref >59)

## 2024-05-03 LAB — T4, FREE: Free T4: 0.76 ng/dL — ABNORMAL LOW (ref 0.82–1.77)

## 2024-05-03 LAB — TSH: TSH: 2.48 u[IU]/mL (ref 0.450–4.500)

## 2024-05-03 NOTE — Progress Notes (Signed)
 If he agrees place fenofibrate  145 mg daily by mouth, #30, refill 1

## 2024-05-03 NOTE — Progress Notes (Signed)
 Established patient visit  Patient: Taylor Medina   DOB: Jan 31, 1966   58 y.o. Male  MRN: [id number] Visit Date: 05/02/2024  Today's healthcare provider: Jolynn Spencer, PA-C   Chief Complaint  Patient presents with   Follow-up    6 month f/u. Foot, knee and leg pain. Pain on more on the bottom, Throat pain due not being able to use CPAP      Subjective     HPI     Follow-up    Additional comments: 6 month f/u. Foot, knee and leg pain. Pain on more on the bottom, Throat pain due not being able to use CPAP         Last edited by Marylen Odella CROME, CMA on 05/02/2024  3:50 PM.       Discussed the use of AI scribe software for clinical note transcription with the patient, who gave verbal consent to proceed.  History of Present Illness Taylor Medina is a 58 year old male with sleep apnea and Dupuytren's contracture who presents with a malfunctioning CPAP machine and foot pain.  His CPAP machine is malfunctioning, leading to snoring, a swollen and red uvula, difficulty swallowing, and occasional breathing obstruction. He uses a mask covering his nose and mouth but needs a new machine due to the current one being broken. He is concerned about the cost and insurance coverage for a replacement.  He experiences foot pain attributed to nodules on the bottom of his foot, with a thick cord pushing up on it. The pain is primarily on the top of his foot and began a couple of weeks ago. He wears orthotics, which usually help, but has noticed a large knot on the bottom of his foot. He has tried using ice for relief. He is preparing for hand surgery due to tendon growth from Dupuytren's contracture.  He experiences migraines about once a month and uses Fioricet as needed. He takes metoprolol  and lisinopril  for effective blood pressure management. He is in the process of quitting smoking, currently smoking half a pack a day. He drinks wine, limited to one or two drinks no more than twice a week, which  has helped normalize his liver enzymes.       11/02/2023    3:37 PM 04/28/2023    1:26 PM 03/23/2023    1:11 PM  Depression screen PHQ 2/9  Decreased Interest 0 0 0  Down, Depressed, Hopeless 0 0 0  PHQ - 2 Score 0 0 0  Altered sleeping 0 0 0  Tired, decreased energy 0 0 0  Change in appetite 0 0 0  Feeling bad or failure about yourself  0 0 0  Trouble concentrating 0 0 0  Moving slowly or fidgety/restless 0 0 0  Suicidal thoughts 0 0 0  PHQ-9 Score 0 0 0  Difficult doing work/chores Not difficult at all Not difficult at all Not difficult at all      04/28/2023    1:26 PM  GAD 7 : Generalized Anxiety Score  Nervous, Anxious, on Edge 0  Control/stop worrying 0  Worry too much - different things 0  Trouble relaxing 0  Restless 0  Easily annoyed or irritable 0  Afraid - awful might happen 0  Total GAD 7 Score 0    Medications: Outpatient Medications Prior to Visit  Medication Sig   butalbital -acetaminophen -caffeine  (FIORICET) 50-325-40 MG tablet Take 1 tablet by mouth every 6 (six) hours as needed for headache.   fenofibrate  (TRICOR ) 48  MG tablet TAKE 1 TABLET BY MOUTH EVERY DAY   ibuprofen (ADVIL) 800 MG tablet Take 800 mg by mouth every 8 (eight) hours as needed.   lisinopril -hydrochlorothiazide  (ZESTORETIC ) 20-25 MG tablet Take 1 tablet by mouth daily.   metoprolol  succinate (TOPROL -XL) 25 MG 24 hr tablet TAKE 1 TABLET (25 MG TOTAL) BY MOUTH DAILY.   pantoprazole  (PROTONIX ) 40 MG tablet TAKE 1 TABLET BY MOUTH TWICE A DAY BEFORE A MEAL   tadalafil  (CIALIS ) 5 MG tablet Take 1 tablet (5 mg total) by mouth daily as needed for erectile dysfunction.   methylPREDNISolone  (MEDROL  DOSEPAK) 4 MG TBPK tablet Use as directed. (Patient not taking: Reported on 05/02/2024)   No facility-administered medications prior to visit.    Review of Systems All negative Except see HPI       Objective    BP 112/87 (BP Location: Left Arm, Patient Position: Sitting, Cuff Size: Normal)    Pulse 66   Resp 16   Ht 5' 9 (1.753 m)   Wt 168 lb (76.2 kg)   SpO2 100%   BMI 24.81 kg/m     Physical Exam Vitals reviewed.  Constitutional:      General: He is not in acute distress.    Appearance: Normal appearance. He is not diaphoretic.  HENT:     Head: Normocephalic and atraumatic.  Eyes:     General: No scleral icterus.    Conjunctiva/sclera: Conjunctivae normal.  Cardiovascular:     Rate and Rhythm: Normal rate and regular rhythm.     Pulses: Normal pulses.     Heart sounds: Normal heart sounds. No murmur heard. Pulmonary:     Effort: Pulmonary effort is normal. No respiratory distress.     Breath sounds: Normal breath sounds. No wheezing or rhonchi.  Musculoskeletal:     Cervical back: Neck supple.     Right lower leg: No edema.     Left lower leg: No edema.  Lymphadenopathy:     Cervical: No cervical adenopathy.  Skin:    General: Skin is warm and dry.     Findings: No rash.  Neurological:     Mental Status: He is alert and oriented to person, place, and time. Mental status is at baseline.  Psychiatric:        Mood and Affect: Mood normal.        Behavior: Behavior normal.      No results found for any visits on 05/02/24.      Assessment & Plan Obstructive Sleep Apnea CPAP machine malfunction causing snoring and uvular swelling, leading to dysphagia.  As the neck step is to address the malfunctioning of CPAP machine and consider alternative treatments such as switching to a nasal mask, reducing blood pressure, using an oral appliances like mandibular advancement device or bilevel device used positive airway pressure  provider plans to refer to a specialist for expedited machine acquisition and setup. - Purchase a new CPAP machine. - Contact the manufacturer or a specialist for programming the new CPAP machine if needed.  Plantar Fibromatosis (Ledderhose Disease) Pain on the dorsum of the foot due to nodules and a thick cord on the plantar surface.  History of Dupuytren's contracture suggests a related condition in the foot. Conservative management recommended - Continue to use orthotics, physical therapy, injections  use heat and cold therapy on the foot. - Massage the affected area. - Consider referral to podiatry if symptoms persist.  Migraine Chronic Migraines occur once or twice a month. Uses  Fioricet as needed, finds it effective. Will follow-up  Hypertension Chronic and unstable Blood pressure well-controlled with metoprolol  25mg  and lisinopril /hydrochlorothiazide  20/25. No issues with the current regimen. Consider adding amlodipine  if Bp at home stays elevated and >then 130/80 Continue low salt diet and regular exercise Will follow-up  Hemochromatosis Elevated iron levels managed previously with phlebotomy. Unable to donate blood due to potential Creutzfeldt-Jacobs disease exposure. Suggested checking with local universities for research studies offering phlebotomy. - Check with local universities ([employer], [place], [facility]) for research studies offering phlebotomy.  General Health Maintenance In the process of quitting smoking, currently smoking half a pack a day. Reduced alcohol consumption, improving liver enzyme levels. Discussed importance of regular physical exams and plans to conduct blood work to monitor chronic conditions. - Schedule blood work before the next visit. - Continue efforts to quit smoking. - Maintain reduced alcohol consumption. - Schedule a physical exam in six weeks.  Essential hypertension, benign (Primary) - Lipid panel - Comprehensive metabolic panel with GFR - CBC with Differential/Platelet - TSH - T4, free  Hyperlipidemia, unspecified hyperlipidemia type - Lipid panel - Comprehensive metabolic panel with GFR - CBC with Differential/Platelet - TSH - T4, free  Tobacco use Pre pt , he is in process of quitting smoking Will reassess  Orders Placed This Encounter  Procedures   Lipid  panel    Has the patient fasted?:   Yes   Comprehensive metabolic panel with GFR    Has the patient fasted?:   Yes   CBC with Differential/Platelet   TSH   T4, free    Return in about 6 weeks (around 06/13/2024) for CPE.   The patient was advised to call back or seek an in-person evaluation if the symptoms worsen or if the condition fails to improve as anticipated.  I discussed the assessment and treatment plan with the patient. The patient was provided an opportunity to ask questions and all were answered. The patient agreed with the plan and demonstrated an understanding of the instructions.  I, Lucero Auzenne, PA-C have reviewed all documentation for this visit. The documentation on 05/02/2024  for the exam, diagnosis, procedures, and orders are all accurate and complete.  Jolynn Spencer, [facility], MMS [facility] [phone] (phone) [phone] (fax)  [facility]

## 2024-05-07 DIAGNOSIS — T888XXA Other specified complications of surgical and medical care, not elsewhere classified, initial encounter: Secondary | ICD-10-CM | POA: Insufficient documentation

## 2024-05-07 DIAGNOSIS — I10 Essential (primary) hypertension: Secondary | ICD-10-CM | POA: Insufficient documentation

## 2024-05-07 DIAGNOSIS — M722 Plantar fascial fibromatosis: Secondary | ICD-10-CM | POA: Insufficient documentation

## 2024-05-09 ENCOUNTER — Telehealth: Payer: Self-pay | Admitting: Sleep Medicine

## 2024-05-09 NOTE — Telephone Encounter (Signed)
 LVMTCB to schedule sleep consult.

## 2024-05-15 ENCOUNTER — Other Ambulatory Visit: Payer: Self-pay | Admitting: Physician Assistant

## 2024-06-15 ENCOUNTER — Encounter: Admitting: Physician Assistant

## 2024-06-27 ENCOUNTER — Ambulatory Visit (INDEPENDENT_AMBULATORY_CARE_PROVIDER_SITE_OTHER): Admitting: Physician Assistant

## 2024-06-27 VITALS — BP 117/94 | HR 97 | Temp 98.6°F | Ht 69.0 in | Wt 169.0 lb

## 2024-06-27 DIAGNOSIS — R0989 Other specified symptoms and signs involving the circulatory and respiratory systems: Secondary | ICD-10-CM

## 2024-06-27 DIAGNOSIS — G43009 Migraine without aura, not intractable, without status migrainosus: Secondary | ICD-10-CM

## 2024-06-27 DIAGNOSIS — Z0001 Encounter for general adult medical examination with abnormal findings: Secondary | ICD-10-CM

## 2024-06-27 DIAGNOSIS — I10 Essential (primary) hypertension: Secondary | ICD-10-CM | POA: Diagnosis not present

## 2024-06-27 DIAGNOSIS — R7989 Other specified abnormal findings of blood chemistry: Secondary | ICD-10-CM

## 2024-06-27 DIAGNOSIS — E785 Hyperlipidemia, unspecified: Secondary | ICD-10-CM | POA: Diagnosis not present

## 2024-06-27 DIAGNOSIS — M722 Plantar fascial fibromatosis: Secondary | ICD-10-CM

## 2024-06-27 DIAGNOSIS — Z720 Tobacco use: Secondary | ICD-10-CM | POA: Diagnosis not present

## 2024-06-27 DIAGNOSIS — Z0000 Encounter for general adult medical examination without abnormal findings: Secondary | ICD-10-CM

## 2024-06-27 NOTE — Progress Notes (Unsigned)
 Complete physical exam  Patient: Taylor Medina   DOB: [birth year]   58 y.o. Male  MRN: [id number] Visit Date: 06/27/2024  Today's healthcare provider: Jolynn Spencer, PA-C   No chief complaint on file.  Subjective    Taylor Medina is a 58 y.o. male who presents today for a complete physical exam.  He reports consuming a {diet types:17450} diet. {Exercise:19826} He generally feels {well/fairly well/poorly:18703}. He reports sleeping {well/fairly well/poorly:18703}. He {does/does not:200015} have additional problems to discuss today.  HPI  *** Discussed the use of AI scribe software for clinical note transcription with the patient, who gave verbal consent to proceed.  History of Present Illness Taylor Medina is a 58 year old male with hypertension and coronary artery disease who presents for a follow-up visit.  He quit smoking two weeks ago and now vapes 10 to 15 times daily. He experiences post-nasal drainage, sore throat, neck tightness when swallowing, occasional blurry vision, and a non-productive cough. He did not take his blood pressure medication this morning due to lack of sleep and a busy schedule. He experiences tingling in his feet, which are often cold. He acknowledges stress and some anxiety due to family issues but does not feel the need for counseling. He has not eaten today, although he had coffee with sugar in the morning.    Last depression screening scores    11/02/2023    3:37 PM 04/28/2023    1:26 PM 03/23/2023    1:11 PM  PHQ 2/9 Scores  PHQ - 2 Score 0 0 0  PHQ- 9 Score 0 0 0   Last fall risk screening    11/19/2023    2:03 PM  Fall Risk   Falls in the past year? 0  Number falls in past yr: 0  Injury with Fall? 0  Risk for fall due to : No Fall Risks  Follow up Falls evaluation completed   Last Audit-C alcohol use screening    03/23/2023    1:11 PM  Alcohol Use Disorder Test (AUDIT)  1. How often do you have a drink containing alcohol? 2  2. How  many drinks containing alcohol do you have on a typical day when you are drinking? 0  3. How often do you have six or more drinks on one occasion? 1  AUDIT-C Score 3   A score of 3 or more in women, and 4 or more in men indicates increased risk for alcohol abuse, EXCEPT if all of the points are from question 1   Past Medical History:  Diagnosis Date   Diverticulosis    Erythrocytosis 01/04/2021   Guillain Barr syndrome (HCC)    High cholesterol    HTN (hypertension)    episodic    Palpitations    Sleep apnea    CPAP   Wears contact lenses    Past Surgical History:  Procedure Laterality Date   COLONOSCOPY WITH PROPOFOL  N/A 12/10/2017   Procedure: COLONOSCOPY WITH PROPOFOL ;  Surgeon: Janalyn Keene NOVAK, MD;  Location: [facility];  Service: Endoscopy;  Laterality: N/A;   DUPUYTREN CONTRACTURE RELEASE Left 09/07/2019   Procedure: DUPUYTREN CONTRACTURE RELEASE;  Surgeon: Edie Norleen PARAS, MD;  Location: [facility];  Service: Orthopedics;  Laterality: Left;   ESOPHAGOGASTRODUODENOSCOPY (EGD) WITH PROPOFOL   12/10/2017   Procedure: ESOPHAGOGASTRODUODENOSCOPY (EGD) WITH PROPOFOL ;  Surgeon: Janalyn Keene NOVAK, MD;  Location: [facility];  Service: Endoscopy;;   FRACTURE SURGERY  2005   Rod placed.  Tib/fib fracture   hardware fractions     on R; several surgeries    hernia repair surgery     Social History   Socioeconomic History   Marital status: Married    Spouse name: Not on file   Number of children: Not on file   Years of education: Not on file   Highest education level: Not on file  Occupational History   Not on file  Tobacco Use   Smoking status: Every Day    Current packs/day: 0.50    Average packs/day: 0.5 packs/day for 38.2 years (19.1 ttl pk-yrs)    Types: Cigarettes    Start date: 04/21/1986   Smokeless tobacco: Never   Tobacco comments:    Previously 1 ppd            STOPPED 2 WEEKS AGO  Vaping Use   Vaping status: Some Days  Substance and  Sexual Activity   Alcohol use: Yes    Alcohol/week: 5.0 standard drinks of alcohol    Types: 5 Glasses of wine per week   Drug use: No   Sexual activity: Not on file  Other Topics Concern   Not on file  Social History Narrative   Single; full time; does not get regular exercise.    Social Drivers of Corporate investment banker Strain: Not on file  Food Insecurity: Not on file  Transportation Needs: Not on file  Physical Activity: Not on file  Stress: Not on file  Social Connections: Not on file  Intimate Partner Violence: Not on file   Family Status  Relation Name Status   Mother  Deceased   Father  Deceased   Other  (Not Specified)   Other  (Not Specified)   Other  (Not Specified)   Brother  (Not Specified)  No partnership data on file   Family History  Problem Relation Age of Onset   Lung cancer Mother    Lung cancer Father    Cancer Other        family hx   Coronary artery disease Other        family hx   Diabetes Other        family hx   Colon cancer Brother    Allergies  Allergen Reactions   Influenza Vaccines     Guillain barre syndrome    Patient Care Team: Dezi Brauner, PA-C as PCP - General (Physician Assistant)   Medications: Outpatient Medications Prior to Visit  Medication Sig   butalbital -acetaminophen -caffeine  (FIORICET) 50-325-40 MG tablet Take 1 tablet by mouth every 6 (six) hours as needed for headache.   fenofibrate  (TRICOR ) 48 MG tablet TAKE 1 TABLET BY MOUTH EVERY DAY   ibuprofen (ADVIL) 800 MG tablet Take 800 mg by mouth every 8 (eight) hours as needed.   lisinopril -hydrochlorothiazide  (ZESTORETIC ) 20-25 MG tablet Take 1 tablet by mouth daily.   methylPREDNISolone  (MEDROL  DOSEPAK) 4 MG TBPK tablet Use as directed. (Patient not taking: Reported on 05/02/2024)   metoprolol  succinate (TOPROL -XL) 25 MG 24 hr tablet TAKE 1 TABLET (25 MG TOTAL) BY MOUTH DAILY.   pantoprazole  (PROTONIX ) 40 MG tablet TAKE 1 TABLET BY MOUTH TWICE A DAY BEFORE  MEALS   tadalafil  (CIALIS ) 5 MG tablet Take 1 tablet (5 mg total) by mouth daily as needed for erectile dysfunction.   No facility-administered medications prior to visit.    Review of Systems Except see HPI  {Insert previous labs (optional):23779} {See past labs  Heme  Chem  Endocrine  Serology  Results Review (optional):1}  Objective    There were no vitals taken for this visit. {Insert last BP/Wt (optional):23777}{See vitals history (optional):1}    Physical Exam Vitals reviewed.  Constitutional:      General: He is not in acute distress.    Appearance: Normal appearance. He is well-developed. He is not ill-appearing, toxic-appearing or diaphoretic.  HENT:     Head: Normocephalic and atraumatic.     Right Ear: Tympanic membrane, ear canal and external ear normal.     Left Ear: Tympanic membrane, ear canal and external ear normal.     Nose: Nose normal. No congestion or rhinorrhea.     Mouth/Throat:     Mouth: Mucous membranes are moist.     Pharynx: Oropharynx is clear. No oropharyngeal exudate.  Eyes:     General: No scleral icterus.       Right eye: No discharge.        Left eye: No discharge.     Conjunctiva/sclera: Conjunctivae normal.     Pupils: Pupils are equal, round, and reactive to light.  Neck:     Thyroid: No thyromegaly.     Vascular: No carotid bruit.  Cardiovascular:     Rate and Rhythm: Normal rate and regular rhythm.     Pulses: Normal pulses.     Heart sounds: Normal heart sounds. No murmur heard.    No friction rub. No gallop.  Pulmonary:     Effort: Pulmonary effort is normal. No respiratory distress.     Breath sounds: Normal breath sounds. No wheezing or rales.  Abdominal:     General: Abdomen is flat. Bowel sounds are normal. There is no distension.     Palpations: Abdomen is soft. There is no mass.     Tenderness: There is no abdominal tenderness. There is no right CVA tenderness, left CVA tenderness, guarding or rebound.     Hernia:  No hernia is present.  Musculoskeletal:        General: No swelling, tenderness, deformity or signs of injury. Normal range of motion.     Cervical back: Normal range of motion and neck supple. No rigidity or tenderness.     Right lower leg: No edema.     Left lower leg: No edema.  Lymphadenopathy:     Cervical: No cervical adenopathy.  Skin:    General: Skin is warm and dry.     Coloration: Skin is not jaundiced or pale.     Findings: No bruising, erythema, lesion or rash.  Neurological:     Mental Status: He is alert and oriented to person, place, and time. Mental status is at baseline.     Gait: Gait normal.  Psychiatric:        Mood and Affect: Mood normal.        Behavior: Behavior normal.        Thought Content: Thought content normal.        Judgment: Judgment normal.      No results found for any visits on 06/27/24.  Assessment & Plan    Routine Health Maintenance and Physical Exam  Exercise Activities and Dietary recommendations  Goals   None     Immunization History  Administered Date(s) Administered   Influenza-Unspecified 05/19/2019   Janssen (J&J) SARS-COV-2 Vaccination 08/17/2020   Tdap 03/27/2019   Zoster Recombinant(Shingrix) 03/18/2021, 02/26/2022    Health Maintenance  Topic Date Due   Pneumococcal Vaccine for age over 33 (1 of 2 -  PCV) Never done   Hepatitis B Vaccine (1 of 3 - 19+ 3-dose series) Never done   COVID-19 Vaccine (2 - 2025-26 season) 06/13/2024   Colon Cancer Screening  12/11/2027   DTaP/Tdap/Td vaccine (2 - Td or Tdap) 03/26/2029   Hepatitis C Screening  Completed   HIV Screening  Completed   Zoster (Shingles) Vaccine  Completed   HPV Vaccine  Aged Out   Meningitis B Vaccine  Aged Out    Discussed health benefits of physical activity, and encouraged him to engage in regular exercise appropriate for his age and condition.  Assessment & Plan Upper respiratory symptoms (acute nasopharyngitis) - Advise increased fluid intake. -  Recommend hot tea with honey. - Suggest warm salt water gargles. - Use nasal saline spray. - Consider Flonase or Nasacort. - Use antihistamines like Allegra, Claritin, or diphenhydramine as needed.  Essential hypertension Blood pressure management is a priority. - Ensure consistent adherence to blood pressure medication regimen.  Hyperlipidemia Previous lipid panel showed very high cholesterol levels. Re-evaluation necessary. - Order fasting lipid panel. - Recheck TSH and T4 levels.  Hypothyroidism (under evaluation for low T4) Further evaluation needed to confirm hypothyroidism. - Recheck TSH and T4 levels.  Tobacco use (current, vaping) Transitioning from smoking to vaping. Nicotine patches not an option. - Encourage reduction in vaping frequency. - Discuss alternative smoking cessation strategies.  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 - Advise on activity modifications to prevent further injury.  Plantar fibromatosis (feet)  Cold feet and tingling (possible neuropathy) Possible neuropathy.  1. Migraine without aura and without status migrainosus, not intractable ***  2. Plantar fibromatosis ***  3. Essential hypertension, benign ***  4. Hyperlipidemia, unspecified hyperlipidemia type *** - Lipid panel - TSH - T4, free  5. Tobacco use ***  6. Annual physical exam (Primary) ***  No follow-ups on file.    The patient was advised to call back or seek an in-person evaluation if the symptoms worsen or if the condition fails to improve as anticipated.  I discussed the assessment and treatment plan with the patient. The patient was provided an opportunity to ask questions and all were answered. The patient agreed with the plan and demonstrated an understanding of the instructions.  I, Harlis Champoux, PA-C have reviewed all documentation for this visit. The documentation on 06/27/2024  for the  exam, diagnosis, procedures, and orders are all accurate and complete.  Jolynn Spencer, [facility], MMS [facility] [phone] (phone) [phone] (fax)  [facility]

## 2024-06-29 ENCOUNTER — Encounter: Payer: Self-pay | Admitting: Physician Assistant

## 2024-08-12 ENCOUNTER — Other Ambulatory Visit: Payer: Self-pay | Admitting: Physician Assistant

## 2024-08-12 DIAGNOSIS — E785 Hyperlipidemia, unspecified: Secondary | ICD-10-CM

## 2024-08-19 ENCOUNTER — Emergency Department

## 2024-08-19 ENCOUNTER — Other Ambulatory Visit: Payer: Self-pay

## 2024-08-19 ENCOUNTER — Inpatient Hospital Stay
Admission: EM | Admit: 2024-08-19 | Discharge: 2024-08-23 | DRG: 199 | Disposition: A | Discharge status: Home / Self Care | Attending: Internal Medicine | Admitting: Internal Medicine

## 2024-08-19 DIAGNOSIS — R45851 Suicidal ideations: Secondary | ICD-10-CM | POA: Diagnosis not present

## 2024-08-19 DIAGNOSIS — Z8249 Family history of ischemic heart disease and other diseases of the circulatory system: Secondary | ICD-10-CM

## 2024-08-19 DIAGNOSIS — S2241XA Multiple fractures of ribs, right side, initial encounter for closed fracture: Secondary | ICD-10-CM | POA: Diagnosis present

## 2024-08-19 DIAGNOSIS — Z833 Family history of diabetes mellitus: Secondary | ICD-10-CM

## 2024-08-19 DIAGNOSIS — E785 Hyperlipidemia, unspecified: Secondary | ICD-10-CM | POA: Diagnosis present

## 2024-08-19 DIAGNOSIS — F1010 Alcohol abuse, uncomplicated: Secondary | ICD-10-CM | POA: Diagnosis present

## 2024-08-19 DIAGNOSIS — S2242XA Multiple fractures of ribs, left side, initial encounter for closed fracture: Principal | ICD-10-CM

## 2024-08-19 DIAGNOSIS — R197 Diarrhea, unspecified: Secondary | ICD-10-CM | POA: Diagnosis present

## 2024-08-19 DIAGNOSIS — S270XXA Traumatic pneumothorax, initial encounter: Principal | ICD-10-CM | POA: Diagnosis present

## 2024-08-19 DIAGNOSIS — J948 Other specified pleural conditions: Secondary | ICD-10-CM | POA: Diagnosis present

## 2024-08-19 DIAGNOSIS — K5900 Constipation, unspecified: Secondary | ICD-10-CM | POA: Diagnosis present

## 2024-08-19 DIAGNOSIS — Z801 Family history of malignant neoplasm of trachea, bronchus and lung: Secondary | ICD-10-CM

## 2024-08-19 DIAGNOSIS — F4381 Prolonged grief disorder: Secondary | ICD-10-CM | POA: Diagnosis present

## 2024-08-19 DIAGNOSIS — F32A Depression, unspecified: Secondary | ICD-10-CM | POA: Diagnosis present

## 2024-08-19 DIAGNOSIS — Z800 Family history of malignant neoplasm of digestive organs: Secondary | ICD-10-CM

## 2024-08-19 DIAGNOSIS — E872 Acidosis, unspecified: Secondary | ICD-10-CM | POA: Diagnosis present

## 2024-08-19 DIAGNOSIS — Z79899 Other long term (current) drug therapy: Secondary | ICD-10-CM

## 2024-08-19 DIAGNOSIS — Z87891 Personal history of nicotine dependence: Secondary | ICD-10-CM

## 2024-08-19 DIAGNOSIS — I10 Essential (primary) hypertension: Secondary | ICD-10-CM | POA: Diagnosis present

## 2024-08-19 DIAGNOSIS — E8729 Other acidosis: Secondary | ICD-10-CM

## 2024-08-19 DIAGNOSIS — J181 Lobar pneumonia, unspecified organism: Secondary | ICD-10-CM | POA: Diagnosis present

## 2024-08-19 DIAGNOSIS — J939 Pneumothorax, unspecified: Secondary | ICD-10-CM | POA: Diagnosis present

## 2024-08-19 DIAGNOSIS — G4730 Sleep apnea, unspecified: Secondary | ICD-10-CM | POA: Diagnosis present

## 2024-08-19 DIAGNOSIS — E7800 Pure hypercholesterolemia, unspecified: Secondary | ICD-10-CM | POA: Diagnosis present

## 2024-08-19 DIAGNOSIS — Z887 Allergy status to serum and vaccine status: Secondary | ICD-10-CM

## 2024-08-19 DIAGNOSIS — F4321 Adjustment disorder with depressed mood: Secondary | ICD-10-CM | POA: Diagnosis present

## 2024-08-19 DIAGNOSIS — Z823 Family history of stroke: Secondary | ICD-10-CM

## 2024-08-19 DIAGNOSIS — R4589 Other symptoms and signs involving emotional state: Secondary | ICD-10-CM

## 2024-08-19 LAB — CBC
HCT: 40.5 % (ref 39.0–52.0)
Hemoglobin: 14.6 g/dL (ref 13.0–17.0)
MCH: 30.5 pg (ref 26.0–34.0)
MCHC: 36 g/dL (ref 30.0–36.0)
MCV: 84.7 fL (ref 80.0–100.0)
Platelets: 337 K/uL (ref 150–400)
RBC: 4.78 MIL/uL (ref 4.22–5.81)
RDW: 11.9 % (ref 11.5–15.5)
WBC: 14.2 K/uL — ABNORMAL HIGH (ref 4.0–10.5)
nRBC: 0 % (ref 0.0–0.2)

## 2024-08-19 LAB — BASIC METABOLIC PANEL WITH GFR
Anion gap: 19 — ABNORMAL HIGH (ref 5–15)
BUN: 14 mg/dL (ref 6–20)
CO2: 16 mmol/L — ABNORMAL LOW (ref 22–32)
Calcium: 9.3 mg/dL (ref 8.9–10.3)
Chloride: 102 mmol/L (ref 98–111)
Creatinine, Ser: 1.36 mg/dL — ABNORMAL HIGH (ref 0.61–1.24)
GFR, Estimated: >60 mL/min (ref >60)
Glucose, Bld: 129 mg/dL — ABNORMAL HIGH (ref 70–99)
Potassium: 3.5 mmol/L (ref 3.5–5.1)
Sodium: 137 mmol/L (ref 135–145)

## 2024-08-19 LAB — TROPONIN I (HIGH SENSITIVITY): Troponin I (High Sensitivity): 6 ng/L (ref <18)

## 2024-08-19 MED ORDER — MORPHINE SULFATE (PF) 4 MG/ML IV SOLN
4.0000 mg | Freq: Once | INTRAVENOUS | Status: AC
  Administered 2024-08-19: 4 mg via INTRAVENOUS
  Filled 2024-08-19: qty 1

## 2024-08-19 NOTE — ED Notes (Signed)
 Patient Belongings: White t shirt Blue/black crocs Blue jeans Yellow wedding ring Blue/green boxers [employer] belt
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
## 2024-08-20 ENCOUNTER — Encounter: Payer: Self-pay | Admitting: Internal Medicine

## 2024-08-20 ENCOUNTER — Emergency Department

## 2024-08-20 DIAGNOSIS — S270XXA Traumatic pneumothorax, initial encounter: Principal | ICD-10-CM

## 2024-08-20 DIAGNOSIS — E785 Hyperlipidemia, unspecified: Secondary | ICD-10-CM

## 2024-08-20 DIAGNOSIS — F1010 Alcohol abuse, uncomplicated: Secondary | ICD-10-CM

## 2024-08-20 DIAGNOSIS — S2241XA Multiple fractures of ribs, right side, initial encounter for closed fracture: Secondary | ICD-10-CM | POA: Diagnosis not present

## 2024-08-20 DIAGNOSIS — J939 Pneumothorax, unspecified: Secondary | ICD-10-CM | POA: Diagnosis present

## 2024-08-20 DIAGNOSIS — E8729 Other acidosis: Secondary | ICD-10-CM | POA: Diagnosis not present

## 2024-08-20 DIAGNOSIS — R197 Diarrhea, unspecified: Secondary | ICD-10-CM

## 2024-08-20 DIAGNOSIS — R45851 Suicidal ideations: Secondary | ICD-10-CM

## 2024-08-20 DIAGNOSIS — I10 Essential (primary) hypertension: Secondary | ICD-10-CM

## 2024-08-20 LAB — BASIC METABOLIC PANEL WITH GFR
Anion gap: 11 (ref 5–15)
BUN: 11 mg/dL (ref 6–20)
CO2: 22 mmol/L (ref 22–32)
Calcium: 9.2 mg/dL (ref 8.9–10.3)
Chloride: 106 mmol/L (ref 98–111)
Creatinine, Ser: 1.16 mg/dL (ref 0.61–1.24)
GFR, Estimated: >60 mL/min (ref >60)
Glucose, Bld: 145 mg/dL — ABNORMAL HIGH (ref 70–99)
Potassium: 3.8 mmol/L (ref 3.5–5.1)
Sodium: 139 mmol/L (ref 135–145)

## 2024-08-20 LAB — HIV ANTIBODY (ROUTINE TESTING W REFLEX): HIV Screen 4th Generation wRfx: NONREACTIVE

## 2024-08-20 LAB — TROPONIN I (HIGH SENSITIVITY): Troponin I (High Sensitivity): 13 ng/L (ref <18)

## 2024-08-20 LAB — ETHANOL: Alcohol, Ethyl (B): 44 mg/dL — ABNORMAL HIGH (ref <15)

## 2024-08-20 LAB — ACETAMINOPHEN LEVEL: Acetaminophen (Tylenol), Serum: <10 ug/mL — ABNORMAL LOW (ref 10–30)

## 2024-08-20 LAB — SALICYLATE LEVEL: Salicylate Lvl: <7 mg/dL — ABNORMAL LOW (ref 7.0–30.0)

## 2024-08-20 MED ORDER — LORAZEPAM 2 MG/ML IJ SOLN: 1.0000 mg | INTRAMUSCULAR | Status: AC | PRN

## 2024-08-20 MED ORDER — FENOFIBRATE 54 MG PO TABS
54.0000 mg | ORAL_TABLET | Freq: Every day | ORAL | Status: DC
  Administered 2024-08-20 – 2024-08-23 (×4): 54 mg via ORAL
  Filled 2024-08-20 (×4): qty 1

## 2024-08-20 MED ORDER — GUAIFENESIN ER 600 MG PO TB12: 600.0000 mg | ORAL_TABLET | Freq: Two times a day (BID) | ORAL | Status: DC | PRN

## 2024-08-20 MED ORDER — DEXTROSE 5 % IN LACTATED RINGERS IV BOLUS
1000.0000 mL | Freq: Once | INTRAVENOUS | Status: AC
  Administered 2024-08-20: 1000 mL via INTRAVENOUS
  Filled 2024-08-20: qty 1000

## 2024-08-20 MED ORDER — FOLIC ACID 1 MG PO TABS
1.0000 mg | ORAL_TABLET | Freq: Every day | ORAL | Status: DC
  Administered 2024-08-20 – 2024-08-23 (×4): 1 mg via ORAL
  Filled 2024-08-20 (×4): qty 1

## 2024-08-20 MED ORDER — MORPHINE SULFATE (PF) 4 MG/ML IV SOLN
4.0000 mg | Freq: Once | INTRAVENOUS | Status: AC
  Administered 2024-08-20: 4 mg via INTRAVENOUS
  Filled 2024-08-20: qty 1

## 2024-08-20 MED ORDER — KETOROLAC TROMETHAMINE 15 MG/ML IJ SOLN
15.0000 mg | Freq: Once | INTRAMUSCULAR | Status: AC
  Administered 2024-08-20: 15 mg via INTRAVENOUS
  Filled 2024-08-20: qty 1

## 2024-08-20 MED ORDER — MORPHINE SULFATE (PF) 2 MG/ML IV SOLN
2.0000 mg | INTRAVENOUS | Status: DC | PRN
  Administered 2024-08-20 – 2024-08-22 (×2): 2 mg via INTRAVENOUS
  Filled 2024-08-20 (×2): qty 1

## 2024-08-20 MED ORDER — LIDOCAINE 5 % EX PTCH
1.0000 | MEDICATED_PATCH | CUTANEOUS | Status: DC
  Administered 2024-08-20 – 2024-08-23 (×4): 1 via TRANSDERMAL
  Filled 2024-08-20 (×4): qty 1

## 2024-08-20 MED ORDER — LISINOPRIL 20 MG PO TABS
20.0000 mg | ORAL_TABLET | Freq: Every day | ORAL | Status: DC
  Administered 2024-08-20 – 2024-08-23 (×4): 20 mg via ORAL
  Filled 2024-08-20 (×4): qty 1

## 2024-08-20 MED ORDER — LORAZEPAM 1 MG PO TABS
1.0000 mg | ORAL_TABLET | ORAL | Status: AC | PRN
  Administered 2024-08-22 – 2024-08-23 (×2): 1 mg via ORAL
  Filled 2024-08-20 (×2): qty 1

## 2024-08-20 MED ORDER — IOHEXOL 300 MG/ML  SOLN
100.0000 mL | Freq: Once | INTRAMUSCULAR | Status: AC | PRN
  Administered 2024-08-20: 100 mL via INTRAVENOUS

## 2024-08-20 MED ORDER — THIAMINE HCL 100 MG/ML IJ SOLN
100.0000 mg | Freq: Once | INTRAMUSCULAR | Status: AC
  Administered 2024-08-20: 100 mg via INTRAVENOUS
  Filled 2024-08-20: qty 2

## 2024-08-20 MED ORDER — ADULT MULTIVITAMIN W/MINERALS CH
1.0000 | ORAL_TABLET | Freq: Every day | ORAL | Status: DC
  Administered 2024-08-20 – 2024-08-23 (×4): 1 via ORAL
  Filled 2024-08-20 (×4): qty 1

## 2024-08-20 MED ORDER — ONDANSETRON HCL 4 MG/2ML IJ SOLN: 4.0000 mg | Freq: Four times a day (QID) | INTRAMUSCULAR | Status: DC | PRN

## 2024-08-20 MED ORDER — LISINOPRIL-HYDROCHLOROTHIAZIDE 20-25 MG PO TABS: 1.0000 | ORAL_TABLET | Freq: Every day | ORAL | Status: DC

## 2024-08-20 MED ORDER — HYDROCODONE-ACETAMINOPHEN 5-325 MG PO TABS
1.0000 | ORAL_TABLET | ORAL | Status: DC | PRN
  Administered 2024-08-20 – 2024-08-22 (×8): 2 via ORAL
  Administered 2024-08-22 – 2024-08-23 (×3): 1 via ORAL
  Filled 2024-08-20 (×5): qty 2
  Filled 2024-08-20 (×2): qty 1
  Filled 2024-08-20 (×3): qty 2
  Filled 2024-08-20: qty 1

## 2024-08-20 MED ORDER — HYDROCHLOROTHIAZIDE 25 MG PO TABS
25.0000 mg | ORAL_TABLET | Freq: Every day | ORAL | Status: DC
  Administered 2024-08-20 – 2024-08-23 (×4): 25 mg via ORAL
  Filled 2024-08-20 (×4): qty 1

## 2024-08-20 MED ORDER — ACETAMINOPHEN 325 MG PO TABS: 650.0000 mg | ORAL_TABLET | Freq: Four times a day (QID) | ORAL | Status: DC | PRN

## 2024-08-20 MED ORDER — ACETAMINOPHEN 650 MG RE SUPP
650.0000 mg | Freq: Four times a day (QID) | RECTAL | Status: DC | PRN
Start: 2024-08-20 — End: 2024-08-23

## 2024-08-20 MED ORDER — THIAMINE HCL 100 MG/ML IJ SOLN: 100.0000 mg | Freq: Every day | INTRAMUSCULAR | Status: DC

## 2024-08-20 MED ORDER — KETOROLAC TROMETHAMINE 30 MG/ML IJ SOLN
30.0000 mg | Freq: Four times a day (QID) | INTRAMUSCULAR | Status: DC | PRN
  Administered 2024-08-20 – 2024-08-23 (×4): 30 mg via INTRAVENOUS
  Filled 2024-08-20 (×4): qty 1

## 2024-08-20 MED ORDER — PANTOPRAZOLE SODIUM 40 MG PO TBEC
40.0000 mg | DELAYED_RELEASE_TABLET | Freq: Every day | ORAL | Status: DC
  Administered 2024-08-20 – 2024-08-23 (×4): 40 mg via ORAL
  Filled 2024-08-20 (×4): qty 1

## 2024-08-20 MED ORDER — ONDANSETRON HCL 4 MG PO TABS: 4.0000 mg | ORAL_TABLET | Freq: Four times a day (QID) | ORAL | Status: DC | PRN

## 2024-08-20 MED ORDER — METOPROLOL SUCCINATE ER 25 MG PO TB24
25.0000 mg | ORAL_TABLET | Freq: Every day | ORAL | Status: DC
  Administered 2024-08-20 – 2024-08-23 (×4): 25 mg via ORAL
  Filled 2024-08-20 (×4): qty 1

## 2024-08-20 MED ORDER — ENOXAPARIN SODIUM 40 MG/0.4ML IJ SOSY
40.0000 mg | PREFILLED_SYRINGE | INTRAMUSCULAR | Status: DC
  Administered 2024-08-20 – 2024-08-23 (×4): 40 mg via SUBCUTANEOUS
  Filled 2024-08-20 (×4): qty 0.4

## 2024-08-20 MED ORDER — CYCLOBENZAPRINE HCL 10 MG PO TABS
10.0000 mg | ORAL_TABLET | Freq: Once | ORAL | Status: AC
  Administered 2024-08-20: 10 mg via ORAL
  Filled 2024-08-20: qty 1

## 2024-08-20 MED ORDER — THIAMINE MONONITRATE 100 MG PO TABS
100.0000 mg | ORAL_TABLET | Freq: Every day | ORAL | Status: DC
  Administered 2024-08-20 – 2024-08-23 (×4): 100 mg via ORAL
  Filled 2024-08-20 (×4): qty 1

## 2024-08-20 MED ORDER — ALBUTEROL SULFATE (2.5 MG/3ML) 0.083% IN NEBU: 2.5000 mg | INHALATION_SOLUTION | RESPIRATORY_TRACT | Status: DC | PRN

## 2024-08-20 NOTE — ED Notes (Addendum)
 Pt placed on nonnonrebreather for the pneumonthorax per Dr. Waymond

## 2024-08-20 NOTE — Assessment & Plan Note (Signed)
 Four rib fractures.  Pain control.  Incentive spirometry.
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
## 2024-08-20 NOTE — Assessment & Plan Note (Signed)
 Will put on alcohol withdrawal protocol.

## 2024-08-20 NOTE — Assessment & Plan Note (Signed)
 Continue lisinopril  HCT and metoprolol .

## 2024-08-20 NOTE — Plan of Care (Signed)
  Problem: Safety: Goal: Ability to remain free from injury will improve Outcome: Progressing   Problem: Pain Managment: Goal: General experience of comfort will improve and/or be controlled Outcome: Progressing   Problem: Coping: Goal: Level of anxiety will decrease Outcome: Progressing

## 2024-08-20 NOTE — Progress Notes (Signed)
 Pt arrived to unit, safely stood and pivoted from stretcher to bed with RN assist. Pt remains on non rebreather per MD order for pneumothorax. Pt breathing even and unlabored. Pt endorses slight discomfort on deep inhale. Pt endorsing pain 3/10 on R ribs at this time. A&OX4. Pt denies SI/HI. Pt states LBM was yesterday 11/7. Pt oriented to room, 1:1 sitter in place, safety maintained.

## 2024-08-20 NOTE — Assessment & Plan Note (Signed)
 Patient seen by psychiatry involuntary commitment released.

## 2024-08-20 NOTE — Assessment & Plan Note (Signed)
 Patient on fenofibrate

## 2024-08-20 NOTE — Assessment & Plan Note (Signed)
 Likely secondary to alcohol abuse.  Improved with IV fluids.

## 2024-08-20 NOTE — Assessment & Plan Note (Signed)
 Repeat CT scan showed small right hydropneumothorax.  Patient's respiratory status stable.  Patient feeling better and wants to go home.

## 2024-08-20 NOTE — BH Assessment (Signed)
 This writer attempted to contact [facility] via phone twice but phone only rang busy. Will attempt at a later time.
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
## 2024-08-20 NOTE — Assessment & Plan Note (Signed)
Stool studies.  
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
## 2024-08-20 NOTE — BH Assessment (Signed)
 Patient was added to [facility] telepsyc waiting list via Secure chat due to [facility] phone system issues. [facility] confirmed via chat. Assessment currently pending

## 2024-08-21 ENCOUNTER — Observation Stay

## 2024-08-21 DIAGNOSIS — S27321S Contusion of lung, unilateral, sequela: Secondary | ICD-10-CM | POA: Diagnosis not present

## 2024-08-21 DIAGNOSIS — F1010 Alcohol abuse, uncomplicated: Secondary | ICD-10-CM | POA: Diagnosis not present

## 2024-08-21 DIAGNOSIS — R079 Chest pain, unspecified: Secondary | ICD-10-CM | POA: Diagnosis not present

## 2024-08-21 DIAGNOSIS — S2241XS Multiple fractures of ribs, right side, sequela: Secondary | ICD-10-CM

## 2024-08-21 DIAGNOSIS — S270XXS Traumatic pneumothorax, sequela: Secondary | ICD-10-CM

## 2024-08-21 DIAGNOSIS — I10 Essential (primary) hypertension: Secondary | ICD-10-CM | POA: Diagnosis not present

## 2024-08-21 DIAGNOSIS — R197 Diarrhea, unspecified: Secondary | ICD-10-CM | POA: Diagnosis not present

## 2024-08-21 DIAGNOSIS — E8729 Other acidosis: Secondary | ICD-10-CM | POA: Diagnosis not present
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
## 2024-08-21 NOTE — Plan of Care (Incomplete)
  Problem: Education: Goal: Knowledge of General Education information will improve Description: Including pain rating scale, medication(s)/side effects and non-pharmacologic comfort measures Outcome: Progressing   Problem: Clinical Measurements: Goal: Ability to maintain clinical measurements within normal limits will improve Outcome: Progressing Goal: Will remain free from infection Outcome: Progressing   Problem: Elimination: Goal: Will not experience complications related to bowel motility Outcome: Progressing   Problem: Safety: Goal: Ability to remain free from injury will improve Outcome: Progressing
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
## 2024-08-22 DIAGNOSIS — K5909 Other constipation: Secondary | ICD-10-CM

## 2024-08-22 DIAGNOSIS — F4381 Prolonged grief disorder: Secondary | ICD-10-CM | POA: Diagnosis present

## 2024-08-22 DIAGNOSIS — F32A Depression, unspecified: Secondary | ICD-10-CM | POA: Diagnosis present

## 2024-08-22 DIAGNOSIS — K5900 Constipation, unspecified: Secondary | ICD-10-CM | POA: Insufficient documentation

## 2024-08-22 DIAGNOSIS — Z800 Family history of malignant neoplasm of digestive organs: Secondary | ICD-10-CM | POA: Diagnosis not present

## 2024-08-22 DIAGNOSIS — Z79899 Other long term (current) drug therapy: Secondary | ICD-10-CM | POA: Diagnosis not present

## 2024-08-22 DIAGNOSIS — J181 Lobar pneumonia, unspecified organism: Secondary | ICD-10-CM

## 2024-08-22 DIAGNOSIS — S2241XA Multiple fractures of ribs, right side, initial encounter for closed fracture: Secondary | ICD-10-CM | POA: Diagnosis present

## 2024-08-22 DIAGNOSIS — Z801 Family history of malignant neoplasm of trachea, bronchus and lung: Secondary | ICD-10-CM | POA: Diagnosis not present

## 2024-08-22 DIAGNOSIS — E7800 Pure hypercholesterolemia, unspecified: Secondary | ICD-10-CM | POA: Diagnosis present

## 2024-08-22 DIAGNOSIS — R45851 Suicidal ideations: Secondary | ICD-10-CM

## 2024-08-22 DIAGNOSIS — Z8249 Family history of ischemic heart disease and other diseases of the circulatory system: Secondary | ICD-10-CM | POA: Diagnosis not present

## 2024-08-22 DIAGNOSIS — I10 Essential (primary) hypertension: Secondary | ICD-10-CM

## 2024-08-22 DIAGNOSIS — R197 Diarrhea, unspecified: Secondary | ICD-10-CM | POA: Diagnosis present

## 2024-08-22 DIAGNOSIS — E872 Acidosis, unspecified: Secondary | ICD-10-CM | POA: Diagnosis present

## 2024-08-22 DIAGNOSIS — Z87891 Personal history of nicotine dependence: Secondary | ICD-10-CM | POA: Diagnosis not present

## 2024-08-22 DIAGNOSIS — F1010 Alcohol abuse, uncomplicated: Secondary | ICD-10-CM | POA: Diagnosis present

## 2024-08-22 DIAGNOSIS — S270XXA Traumatic pneumothorax, initial encounter: Principal | ICD-10-CM

## 2024-08-22 DIAGNOSIS — S270XXS Traumatic pneumothorax, sequela: Secondary | ICD-10-CM | POA: Diagnosis not present

## 2024-08-22 DIAGNOSIS — F4321 Adjustment disorder with depressed mood: Secondary | ICD-10-CM | POA: Diagnosis present

## 2024-08-22 DIAGNOSIS — E8729 Other acidosis: Secondary | ICD-10-CM

## 2024-08-22 DIAGNOSIS — J948 Other specified pleural conditions: Secondary | ICD-10-CM | POA: Diagnosis present

## 2024-08-22 DIAGNOSIS — E785 Hyperlipidemia, unspecified: Secondary | ICD-10-CM

## 2024-08-22 DIAGNOSIS — S2241XS Multiple fractures of ribs, right side, sequela: Secondary | ICD-10-CM | POA: Diagnosis not present

## 2024-08-22 DIAGNOSIS — Z833 Family history of diabetes mellitus: Secondary | ICD-10-CM | POA: Diagnosis not present

## 2024-08-22 DIAGNOSIS — Z823 Family history of stroke: Secondary | ICD-10-CM | POA: Diagnosis not present

## 2024-08-22 DIAGNOSIS — G4730 Sleep apnea, unspecified: Secondary | ICD-10-CM | POA: Diagnosis present

## 2024-08-22 DIAGNOSIS — Z887 Allergy status to serum and vaccine status: Secondary | ICD-10-CM | POA: Diagnosis not present

## 2024-08-22 LAB — CBC
HCT: 36.1 % — ABNORMAL LOW (ref 39.0–52.0)
Hemoglobin: 12.6 g/dL — ABNORMAL LOW (ref 13.0–17.0)
MCH: 30.2 pg (ref 26.0–34.0)
MCHC: 34.9 g/dL (ref 30.0–36.0)
MCV: 86.6 fL (ref 80.0–100.0)
Platelets: 221 K/uL (ref 150–400)
RBC: 4.17 MIL/uL — ABNORMAL LOW (ref 4.22–5.81)
RDW: 12 % (ref 11.5–15.5)
WBC: 7.7 K/uL (ref 4.0–10.5)
nRBC: 0 % (ref 0.0–0.2)

## 2024-08-22 LAB — BASIC METABOLIC PANEL WITH GFR
Anion gap: 11 (ref 5–15)
BUN: 21 mg/dL — ABNORMAL HIGH (ref 6–20)
CO2: 25 mmol/L (ref 22–32)
Calcium: 8.9 mg/dL (ref 8.9–10.3)
Chloride: 103 mmol/L (ref 98–111)
Creatinine, Ser: 1.38 mg/dL — ABNORMAL HIGH (ref 0.61–1.24)
GFR, Estimated: >60 mL/min (ref >60)
Glucose, Bld: 103 mg/dL — ABNORMAL HIGH (ref 70–99)
Potassium: 3.9 mmol/L (ref 3.5–5.1)
Sodium: 139 mmol/L (ref 135–145)

## 2024-08-22 MED ORDER — POLYETHYLENE GLYCOL 3350 17 G PO PACK
17.0000 g | PACK | Freq: Once | ORAL | Status: AC
  Administered 2024-08-22: 17 g via ORAL
  Filled 2024-08-22: qty 1

## 2024-08-22 MED ORDER — SODIUM CHLORIDE 0.9 % IV SOLN
2.0000 g | INTRAVENOUS | Status: DC
  Administered 2024-08-22 – 2024-08-23 (×2): 2 g via INTRAVENOUS
  Filled 2024-08-22 (×2): qty 20

## 2024-08-22 MED ORDER — POLYETHYLENE GLYCOL 3350 17 G PO PACK
17.0000 g | PACK | Freq: Every day | ORAL | Status: DC
  Administered 2024-08-22 – 2024-08-23 (×2): 17 g via ORAL
  Filled 2024-08-22 (×2): qty 1

## 2024-08-22 MED ORDER — AZITHROMYCIN 250 MG PO TABS
250.0000 mg | ORAL_TABLET | Freq: Every day | ORAL | Status: DC
  Administered 2024-08-23: 250 mg via ORAL
  Filled 2024-08-22: qty 1

## 2024-08-22 MED ORDER — AZITHROMYCIN 500 MG PO TABS
500.0000 mg | ORAL_TABLET | Freq: Every day | ORAL | Status: AC
  Administered 2024-08-22: 500 mg via ORAL
  Filled 2024-08-22: qty 1

## 2024-08-22 NOTE — Progress Notes (Signed)
 Last BM 11/07, 2 dose of MiraLax given as ordered.

## 2024-08-22 NOTE — Assessment & Plan Note (Signed)
 Resolved

## 2024-08-22 NOTE — Plan of Care (Signed)
 Patient is alert and oriented x4, CIWA protocol. Diminished lung sounds, on room air no noted cough. Abdomen soft, last bowel movement 11/7, +bowel sounds. Voiding without any difficulty. +CMS, able to feel sensation, +dorsi/plantar flex, denies numbness or tingling. Tolerating diet. Ambulating independently out of bed. 1:1 safety sitter at bedside for IVC. Hourly rounding performed, fall precautions maintained, and call bell within reach.  Problem: Education: Goal: Knowledge of General Education information will improve Description: Including pain rating scale, medication(s)/side effects and non-pharmacologic comfort measures Outcome: Progressing   Problem: Health Behavior/Discharge Planning: Goal: Ability to manage health-related needs will improve Outcome: Progressing   Problem: Clinical Measurements: Goal: Ability to maintain clinical measurements within normal limits will improve Outcome: Progressing Goal: Will remain free from infection Outcome: Progressing Goal: Diagnostic test results will improve Outcome: Progressing Goal: Respiratory complications will improve Outcome: Progressing Goal: Cardiovascular complication will be avoided Outcome: Progressing   Problem: Activity: Goal: Risk for activity intolerance will decrease Outcome: Progressing   Problem: Nutrition: Goal: Adequate nutrition will be maintained Outcome: Progressing   Problem: Coping: Goal: Level of anxiety will decrease Outcome: Progressing   Problem: Elimination: Goal: Will not experience complications related to bowel motility Outcome: Progressing Goal: Will not experience complications related to urinary retention Outcome: Progressing   Problem: Pain Managment: Goal: General experience of comfort will improve and/or be controlled Outcome: Progressing   Problem: Safety: Goal: Ability to remain free from injury will improve Outcome: Progressing   Problem: Skin Integrity: Goal: Risk for impaired  skin integrity will decrease Outcome: Progressing
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
## 2024-08-22 NOTE — TOC CM/SW Note (Signed)
 Transition of Care [facility]) CM/SW Note   Transition of Care [facility]) - Inpatient Brief Assessment   Patient Details  Name: Taylor Medina MRN: [id number] Date of Birth: Jun 24, 1966  [facility]) CM/SW Contact:    Alvaro Louder, LCSW Phone Number: 08/22/2024, 10:08 AM   Clinical Narrative: Per Consult Smoke Cessation and substance abuse information placed in AVS. No other TOC needs at this time. Please consult if they arise.  TOC to follow for discharge   Transition of Care Asessment: Insurance and Status: Insurance coverage has been reviewed Patient has primary care physician: Yes Home environment has been reviewed: Single family home Prior level of function:: Ox4 Prior/Current Home Services: No current home services Social Drivers of Health Review: SDOH reviewed needs interventions Readmission risk has been reviewed: Yes Transition of care needs: transition of care needs identified, TOC will continue to follow
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
## 2024-08-22 NOTE — Assessment & Plan Note (Signed)
 Start Rocephin and Zithromax

## 2024-08-22 NOTE — BH Assessment (Signed)
 IVC PAPERS  RESCINDED  PER  ANNIE  SMITH NP  INFORMED  [facility]  LAMA  RN

## 2024-08-23 DIAGNOSIS — K5909 Other constipation: Secondary | ICD-10-CM

## 2024-08-23 DIAGNOSIS — S270XXS Traumatic pneumothorax, sequela: Secondary | ICD-10-CM | POA: Diagnosis not present

## 2024-08-23 DIAGNOSIS — J181 Lobar pneumonia, unspecified organism: Secondary | ICD-10-CM | POA: Diagnosis not present

## 2024-08-23 DIAGNOSIS — F1010 Alcohol abuse, uncomplicated: Secondary | ICD-10-CM | POA: Diagnosis not present

## 2024-08-23 DIAGNOSIS — S2241XS Multiple fractures of ribs, right side, sequela: Secondary | ICD-10-CM | POA: Diagnosis not present

## 2024-08-23 LAB — GLUCOSE, CAPILLARY: Glucose-Capillary: 99 mg/dL (ref 70–99)

## 2024-08-23 MED ORDER — HYDROCODONE-ACETAMINOPHEN 5-325 MG PO TABS: 1.0000 | ORAL_TABLET | ORAL | 0 refills | Status: AC | PRN

## 2024-08-23 MED ORDER — AZITHROMYCIN 250 MG PO TABS: ORAL_TABLET | ORAL | 0 refills | Status: AC

## 2024-08-23 MED ORDER — POLYETHYLENE GLYCOL 3350 17 G PO PACK: 17.0000 g | PACK | Freq: Every day | ORAL | 0 refills | Status: AC | PRN

## 2024-08-23 MED ORDER — AMOXICILLIN-POT CLAVULANATE 875-125 MG PO TABS: 1.0000 | ORAL_TABLET | Freq: Two times a day (BID) | ORAL | 0 refills | Status: AC

## 2024-08-23 MED ORDER — FOLIC ACID 1 MG PO TABS: 1.0000 mg | ORAL_TABLET | Freq: Every day | ORAL | 0 refills | Status: AC

## 2024-08-23 MED ORDER — VITAMIN B-1 100 MG PO TABS: 100.0000 mg | ORAL_TABLET | Freq: Every day | ORAL | 0 refills | Status: AC

## 2024-08-23 NOTE — Plan of Care (Signed)
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
## 2024-08-24 ENCOUNTER — Telehealth: Payer: Self-pay

## 2024-08-24 NOTE — Transitions of Care (Post Inpatient/ED Visit) (Signed)
 08/24/2024  Name: Taylor Medina MRN: [id number] DOB: Jan 09, 1966  Today's TOC FU Call Status: Today's TOC FU Call Status:: Successful TOC FU Call Completed TOC FU Call Complete Date: 08/24/24  Patient's Name and Date of Birth confirmed. Name, DOB  Transition Care Management Follow-up Telephone Call Date of Discharge: 08/23/24 Discharge Facility: [facility]) Type of Discharge: Inpatient Admission Primary Inpatient Discharge Diagnosis:: pneumothorax How have you been since you were released from the hospital?: Better Any questions or concerns?: No  Items Reviewed: Did you receive and understand the discharge instructions provided?: Yes Medications obtained,verified, and reconciled?: Yes (Medications Reviewed) Any new allergies since your discharge?: No Dietary orders reviewed?: Yes Do you have support at home?: Yes People in Home [RPT]: spouse  Medications Reviewed Today: Medications Reviewed Today     Reviewed by Emmitt Pan, LPN (Licensed Practical Nurse) on 08/24/24 at [phone]  Med List Status: <None>   Medication Order Taking? Sig Documenting Provider Last Dose Status Informant  amoxicillin -clavulanate (AUGMENTIN ) 875-125 MG tablet [id number] Yes Take 1 tablet by mouth 2 (two) times daily for 3 days. Josette Ade, MD  Active   azithromycin (ZITHROMAX) 250 MG tablet [id number] Yes On tab po for three more days Josette Ade, MD  Active   fenofibrate  (TRICOR ) 48 MG tablet [id number] Yes TAKE 1 TABLET BY MOUTH EVERY DAY Ostwalt, Janna, PA-C  Active   folic acid (FOLVITE) 1 MG tablet [id number] Yes Take 1 tablet (1 mg total) by mouth daily. Josette Ade, MD  Active   HYDROcodone -acetaminophen  (NORCO/VICODIN) 5-325 MG tablet [id number] Yes Take 1 tablet by mouth every 4 (four) hours as needed for up to 5 days for severe pain (pain score 7-10). Josette Ade, MD  Active   ibuprofen (ADVIL) 800 MG tablet [id number] Yes Take 800 mg by mouth  every 8 (eight) hours as needed. [provider]  Active   lisinopril -hydrochlorothiazide  (ZESTORETIC ) 20-25 MG tablet [id number] Yes Take 1 tablet by mouth daily. Ostwalt, Janna, PA-C  Active   metoprolol  succinate (TOPROL -XL) 25 MG 24 hr tablet [id number] Yes TAKE 1 TABLET (25 MG TOTAL) BY MOUTH DAILY. Emilio Marseille T, FNP  Active   pantoprazole  (PROTONIX ) 40 MG tablet [id number] Yes TAKE 1 TABLET BY MOUTH TWICE A DAY BEFORE MEALS Ostwalt, Janna, PA-C  Active   polyethylene glycol (MIRALAX / GLYCOLAX) 17 g packet [id number] Yes Take 17 g by mouth daily as needed for moderate constipation. Josette Ade, MD  Active     Discontinued 07/28/16 1617   tadalafil  (CIALIS ) 5 MG tablet [id number] Yes Take 1 tablet (5 mg total) by mouth daily as needed for erectile dysfunction. Helon Kirsch A, PA-C  Active   thiamine (VITAMIN B-1) 100 MG tablet [id number] Yes Take 1 tablet (100 mg total) by mouth daily. Josette Ade, MD  Active             Home Care and Equipment/Supplies: Were Home Health Services Ordered?: NA Any new equipment or medical supplies ordered?: NA  Functional Questionnaire: Do you need assistance with bathing/showering or dressing?: No Do you need assistance with meal preparation?: No Do you need assistance with eating?: No Do you have difficulty maintaining continence: No Do you need assistance with getting out of bed/getting out of a chair/moving?: No Do you have difficulty managing or taking your medications?: No  Follow up appointments reviewed: PCP Follow-up appointment confirmed?: Yes Date of PCP follow-up appointment?: 08/26/24 Follow-up Provider: [facility] Follow-up appointment confirmed?: NA Do you need transportation  to your follow-up appointment?: No Do you understand care options if your condition(s) worsen?: Yes-patient verbalized understanding    SIGNATURE Julian Lemmings, LPN [facility] Nurse Health Advisor Direct Dial [phone]

## 2024-08-26 ENCOUNTER — Ambulatory Visit (INDEPENDENT_AMBULATORY_CARE_PROVIDER_SITE_OTHER): Admitting: Physician Assistant

## 2024-08-26 ENCOUNTER — Encounter: Payer: Self-pay | Admitting: Physician Assistant

## 2024-08-26 VITALS — BP 137/96 | HR 64 | Ht 69.0 in | Wt 170.0 lb

## 2024-08-26 DIAGNOSIS — J9383 Other pneumothorax: Secondary | ICD-10-CM

## 2024-08-26 DIAGNOSIS — F1010 Alcohol abuse, uncomplicated: Secondary | ICD-10-CM | POA: Diagnosis not present

## 2024-08-26 DIAGNOSIS — S2249XD Multiple fractures of ribs, unspecified side, subsequent encounter for fracture with routine healing: Secondary | ICD-10-CM | POA: Diagnosis not present

## 2024-08-26 DIAGNOSIS — F419 Anxiety disorder, unspecified: Secondary | ICD-10-CM | POA: Diagnosis not present

## 2024-08-26 DIAGNOSIS — Z09 Encounter for follow-up examination after completed treatment for conditions other than malignant neoplasm: Secondary | ICD-10-CM

## 2024-08-26 DIAGNOSIS — F32A Depression, unspecified: Secondary | ICD-10-CM

## 2024-08-26 DIAGNOSIS — I10 Essential (primary) hypertension: Secondary | ICD-10-CM | POA: Diagnosis not present

## 2024-08-26 DIAGNOSIS — J181 Lobar pneumonia, unspecified organism: Secondary | ICD-10-CM

## 2024-08-26 DIAGNOSIS — G4733 Obstructive sleep apnea (adult) (pediatric): Secondary | ICD-10-CM

## 2024-08-27 NOTE — Progress Notes (Signed)
 Established patient visit  Patient: Taylor Medina   DOB: June 07, 1966   58 y.o. Male  MRN: [id number] Visit Date: 08/26/2024  Today's healthcare provider: Jolynn Spencer, PA-C   Chief Complaint  Patient presents with   Hospitalization Follow-up    08/19/24 ED f/u rib fracture morning wakes up w more pain. Alternates ibuprofen with hydrocodone .    Subjective     Discussed the use of AI scribe software for clinical note transcription with the patient, who gave verbal consent to proceed.  History of Present Illness MAJESTY OEHLERT is a 58 year old male who presents for follow-up after a hospital visit due to a fall resulting in pneumothorax, multiple rib fractures, and pneumonia.  He sustained a fall resulting in a small right pneumothorax and multiple closed rib fractures on the right side. Pain is managed with prescribed medications, with 16 doses remaining. Pain levels are 2-3/10 at rest and 5-6/10 with movement.  He is completing a course of antibiotics for pneumonia, with today being the last day. Occasional shortness of breath occurs, particularly with increased pain during movement.  He has stopped alcohol use since the fall and denies withdrawal symptoms. He is on lisinopril  and metoprolol  for elevated blood pressure but has not started thiamine due to pharmacy availability.  He has sleep apnea and is not using his CPAP machine due to equipment issues. He experiences increased throat irritation and snoring.  He is experiencing stress related to work and personal life events, including the recent loss of a pet, and is seeking psychiatric support. 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   Follow up Hospitalization  Patient was admitted to [facility] on 08/19/24 and discharged on 08/23/24. He was treated for pneumothorax, multiple closed fractures of ribs of right side, lobar pneumonia, alcohol abuse, primary hypertension, high anion  gap  metabolic acidosis, suicidal ideation, hyperlipidemia, constipation. Treatment for this included pain control, rocephin and Zithromax,  Zithromax and Augmentin  - po for lobar pneumonia,IV fluids for metabolic acidosis, lisinopril  HCT and metoprolol  for htn, fenofibrate  for hld. Telephone follow up was done on 08/24/24 He reports good compliance with treatment. He reports this condition is improved.  ----------------------------------------------------------------------------------------- -     06/27/2024    3:55 PM 11/02/2023    3:37 PM 04/28/2023    1:26 PM  Depression screen PHQ 2/9  Decreased Interest 0 0 0  Down, Depressed, Hopeless 0 0 0  PHQ - 2 Score 0 0 0  Altered sleeping 0 0 0  Tired, decreased energy 0 0 0  Change in appetite 0 0 0  Feeling bad or failure about yourself  0 0 0  Trouble concentrating 0 0 0  Moving slowly or fidgety/restless 0 0 0  Suicidal thoughts 0 0 0  PHQ-9 Score 0  0  0   Difficult doing work/chores Not difficult at all Not difficult at all Not difficult at all     Data saved with a previous flowsheet row definition      06/27/2024    3:55 PM 04/28/2023    1:26 PM  GAD 7 : Generalized Anxiety Score  Nervous, Anxious, on Edge 0 0  Control/stop worrying 0 0  Worry too much - different things 0 0  Trouble relaxing 0 0  Restless 0 0  Easily annoyed or irritable 0 0  Afraid - awful might happen 0 0  Total GAD 7 Score 0 0  Anxiety Difficulty Not  difficult at all     Medications: Outpatient Medications Prior to Visit  Medication Sig   amoxicillin -clavulanate (AUGMENTIN ) 875-125 MG tablet Take 1 tablet by mouth 2 (two) times daily for 3 days.   azithromycin (ZITHROMAX) 250 MG tablet On tab po for three more days   fenofibrate  (TRICOR ) 48 MG tablet TAKE 1 TABLET BY MOUTH EVERY DAY   folic acid (FOLVITE) 1 MG tablet Take 1 tablet (1 mg total) by mouth daily.   HYDROcodone -acetaminophen  (NORCO/VICODIN) 5-325 MG tablet Take 1 tablet by mouth every 4  (four) hours as needed for up to 5 days for severe pain (pain score 7-10).   ibuprofen (ADVIL) 800 MG tablet Take 800 mg by mouth every 8 (eight) hours as needed.   lisinopril -hydrochlorothiazide  (ZESTORETIC ) 20-25 MG tablet Take 1 tablet by mouth daily.   metoprolol  succinate (TOPROL -XL) 25 MG 24 hr tablet TAKE 1 TABLET (25 MG TOTAL) BY MOUTH DAILY.   pantoprazole  (PROTONIX ) 40 MG tablet TAKE 1 TABLET BY MOUTH TWICE A DAY BEFORE MEALS   polyethylene glycol (MIRALAX / GLYCOLAX) 17 g packet Take 17 g by mouth daily as needed for moderate constipation.   tadalafil  (CIALIS ) 5 MG tablet Take 1 tablet (5 mg total) by mouth daily as needed for erectile dysfunction.   thiamine (VITAMIN B-1) 100 MG tablet Take 1 tablet (100 mg total) by mouth daily.   No facility-administered medications prior to visit.    Review of Systems All negative Except see HPI       Objective    BP (!) 137/96   Pulse 64   Ht 5' 9 (1.753 m)   Wt 170 lb (77.1 kg)   SpO2 100%   BMI 25.10 kg/m     Physical Exam Vitals reviewed.  Constitutional:      General: He is not in acute distress.    Appearance: Normal appearance. He is not diaphoretic.  HENT:     Head: Normocephalic and atraumatic.  Eyes:     General: No scleral icterus.    Conjunctiva/sclera: Conjunctivae normal.  Cardiovascular:     Rate and Rhythm: Normal rate and regular rhythm.     Pulses: Normal pulses.     Heart sounds: Normal heart sounds. No murmur heard. Pulmonary:     Effort: Pulmonary effort is normal. No respiratory distress.     Breath sounds: Normal breath sounds. No wheezing or rhonchi.  Musculoskeletal:     Cervical back: Neck supple.     Right lower leg: No edema.     Left lower leg: No edema.  Lymphadenopathy:     Cervical: No cervical adenopathy.  Skin:    General: Skin is warm and dry.     Findings: No rash.  Neurological:     Mental Status: He is alert and oriented to person, place, and time. Mental status is at  baseline.  Psychiatric:        Mood and Affect: Mood normal.        Behavior: Behavior normal.      No results found for any visits on 08/26/24.      Assessment & Plan Hospital discharge FOLLOW-UP Pneumothorax Multiple closed fractures of ribs of right side Lobar pneumonia Alcohol abuse Primary hypertension High anion gap metabolic acidosis Suicidal ideation Hyperlipidemia Constipation  Multiple right rib fractures Pain managed with current regimen, varies with movement. - Continue current pain management regimen. - Consider lidocaine  patches for additional pain relief if needed. Continue incentive spirometry Will follow-up  Right-sided  pneumothorax Small pneumothorax noted on CT. Respiratory status stable, ready for discharge. Feels better  Pneumonia On Zithromax and Augmentin . Shortness of breath linked to pain, no significant distress. - Continue Zithromax and Augmentin  as prescribed. Will fu\  Primary hypertension Chronic Unstable Blood pressure elevated, possibly due to pain. On lisinopril  and metoprolol . - Monitor blood pressure at home. - Adjust antihypertensive medication/metoprolol  25, zestoretic  20-25 if blood pressure remains elevated in two weeks. Continue low sodium diet and regular exercise Fenofibrate  48mg  for hyperlipidemia Will follow-up  Obstructive sleep apnea Chronic Worsening symptoms, not using CPAP due to insurance issues. - Continue working with insurance to resolve CPAP issues. Will follow-up  Alcohol dependence in remission No alcohol consumption since hospitalization, no withdrawal symptoms. - Continue abstinence from alcohol.  Depression and anxiety Stress and anxiety related to life circumstances. No current medication, plans for counseling and evaluation. - Initiated counseling and psychiatric evaluation. - Consider starting medication for anxiety if needed.  Primary hypertension - CBC w/Diff/Platelet - Comprehensive  metabolic panel with GFR - TSH - T4, free - Lipid panel  Pt will need FMLA and short-term disability Orders Placed This Encounter  Procedures   CBC w/Diff/Platelet   Comprehensive metabolic panel with GFR    Has the patient fasted?:   Yes   TSH   T4, free   Lipid panel    Has the patient fasted?:   Yes    Return in about 4 weeks (around 09/23/2024) for chronic disease f/u.   The patient was advised to call back or seek an in-person evaluation if the symptoms worsen or if the condition fails to improve as anticipated.  I discussed the assessment and treatment plan with the patient. The patient was provided an opportunity to ask questions and all were answered. The patient agreed with the plan and demonstrated an understanding of the instructions.  I, Kc Sedlak, PA-C have reviewed all documentation for this visit. The documentation on 08/26/2024  for the exam, diagnosis, procedures, and orders are all accurate and complete.  Jolynn Spencer, [facility], MMS [facility] [phone] (phone) [phone] (fax)  [facility]

## 2024-08-28 NOTE — Addendum Note (Signed)
 Addended by: Myshawn Chiriboga on: 08/28/2024 10:06 PM   Modules accepted: Level of Service

## 2024-08-29 ENCOUNTER — Telehealth: Payer: Self-pay | Admitting: Physician Assistant

## 2024-08-29 NOTE — Telephone Encounter (Signed)
 Patient dropped off short term disability papers for completion. He said it started on 08/22/24 and  hopes to return to work on 09/12/24 if you will release him back to work.    Call patient with questions and let him know when the forms are complete   [phone].

## 2024-08-30 LAB — COMPREHENSIVE METABOLIC PANEL WITH GFR
ALT: 29 IU/L (ref 0–44)
AST: 22 IU/L (ref 0–40)
Albumin: 4.8 g/dL (ref 3.8–4.9)
Alkaline Phosphatase: 58 IU/L (ref 47–123)
BUN/Creatinine Ratio: 15 (ref 9–20)
BUN: 24 mg/dL (ref 6–24)
Bilirubin Total: 0.6 mg/dL (ref 0.0–1.2)
CO2: 23 mmol/L (ref 20–29)
Calcium: 10.6 mg/dL — ABNORMAL HIGH (ref 8.7–10.2)
Chloride: 102 mmol/L (ref 96–106)
Creatinine, Ser: 1.62 mg/dL — ABNORMAL HIGH (ref 0.76–1.27)
Globulin, Total: 2.9 g/dL (ref 1.5–4.5)
Glucose: 100 mg/dL — ABNORMAL HIGH (ref 70–99)
Potassium: 4.7 mmol/L (ref 3.5–5.2)
Sodium: 137 mmol/L (ref 134–144)
Total Protein: 7.7 g/dL (ref 6.0–8.5)
eGFR: 50 mL/min/1.73 — ABNORMAL LOW (ref >59)

## 2024-08-30 LAB — CBC WITH DIFFERENTIAL/PLATELET
Basophils Absolute: 0 x10E3/uL (ref 0.0–0.2)
Basos: 0 %
EOS (ABSOLUTE): 0.3 x10E3/uL (ref 0.0–0.4)
Eos: 4 %
Hematocrit: 43.4 % (ref 37.5–51.0)
Hemoglobin: 14.6 g/dL (ref 13.0–17.7)
Immature Grans (Abs): 0 x10E3/uL (ref 0.0–0.1)
Immature Granulocytes: 0 %
Lymphocytes Absolute: 1.5 x10E3/uL (ref 0.7–3.1)
Lymphs: 21 %
MCH: 30.7 pg (ref 26.6–33.0)
MCHC: 33.6 g/dL (ref 31.5–35.7)
MCV: 91 fL (ref 79–97)
Monocytes Absolute: 0.7 x10E3/uL (ref 0.1–0.9)
Monocytes: 9 %
Neutrophils Absolute: 4.8 x10E3/uL (ref 1.4–7.0)
Neutrophils: 66 %
Platelets: 313 x10E3/uL (ref 150–450)
RBC: 4.76 x10E6/uL (ref 4.14–5.80)
RDW: 12.2 % (ref 11.6–15.4)
WBC: 7.4 x10E3/uL (ref 3.4–10.8)

## 2024-08-30 LAB — LIPID PANEL
Chol/HDL Ratio: 5.8 ratio — ABNORMAL HIGH (ref 0.0–5.0)
Cholesterol, Total: 216 mg/dL — ABNORMAL HIGH (ref 100–199)
HDL: 37 mg/dL — ABNORMAL LOW (ref >39)
LDL Chol Calc (NIH): 136 mg/dL — ABNORMAL HIGH (ref 0–99)
Triglycerides: 240 mg/dL — ABNORMAL HIGH (ref 0–149)
VLDL Cholesterol Cal: 43 mg/dL — ABNORMAL HIGH (ref 5–40)

## 2024-08-30 LAB — TSH: TSH: 7.08 u[IU]/mL — ABNORMAL HIGH (ref 0.450–4.500)

## 2024-08-30 LAB — T4, FREE: Free T4: 0.72 ng/dL — ABNORMAL LOW (ref 0.82–1.77)

## 2024-08-31 NOTE — Telephone Encounter (Signed)
 Last read by Hodges E Berkowitz at 8:24AM on 08/31/2024.

## 2024-09-02 ENCOUNTER — Ambulatory Visit: Payer: Self-pay | Admitting: Physician Assistant

## 2024-09-02 DIAGNOSIS — F419 Anxiety disorder, unspecified: Secondary | ICD-10-CM

## 2024-09-02 DIAGNOSIS — E038 Other specified hypothyroidism: Secondary | ICD-10-CM

## 2024-09-05 MED ORDER — LEVOTHYROXINE SODIUM 25 MCG PO TABS: 25.0000 ug | ORAL_TABLET | Freq: Every day | ORAL | 3 refills | Status: AC

## 2024-09-05 NOTE — Telephone Encounter (Signed)
 Levothyroxine  was sent Referral for therapy ans assessment was placed Please, remember to schedule an app for FMLA papers. Virtual will be okay as well  Collaboration of Care: Medication Management AEB  , Primary Care Provider AEB  , Psychiatrist AEB  , and Referral or follow-up with counselor/therapist AEB    Patient/Guardian was advised Release of Information must be obtained prior to any record release in order to collaborate their care with an outside provider. Patient/Guardian was advised if they have not already done so to contact the registration department to sign all necessary forms in order for us  to release information regarding their care.   Consent: Patient/Guardian gives verbal consent for treatment and assignment of benefits for services provided during this visit. Patient/Guardian expressed understanding and agreed to proceed.

## 2024-09-05 NOTE — Addendum Note (Signed)
 Addended by: Paulette Rockford on: 09/05/2024 08:57 AM   Modules accepted: Orders

## 2024-09-06 ENCOUNTER — Ambulatory Visit: Admitting: Physician Assistant

## 2024-09-06 ENCOUNTER — Encounter: Payer: Self-pay | Admitting: Physician Assistant

## 2024-09-06 ENCOUNTER — Encounter: Payer: Self-pay | Admitting: Oncology

## 2024-09-06 VITALS — BP 134/88 | HR 77 | Ht 69.0 in | Wt 166.3 lb

## 2024-09-06 DIAGNOSIS — S2249XD Multiple fractures of ribs, unspecified side, subsequent encounter for fracture with routine healing: Secondary | ICD-10-CM

## 2024-09-06 DIAGNOSIS — F32A Depression, unspecified: Secondary | ICD-10-CM

## 2024-09-06 DIAGNOSIS — S2241XS Multiple fractures of ribs, right side, sequela: Secondary | ICD-10-CM

## 2024-09-06 DIAGNOSIS — Z720 Tobacco use: Secondary | ICD-10-CM

## 2024-09-06 DIAGNOSIS — J181 Lobar pneumonia, unspecified organism: Secondary | ICD-10-CM

## 2024-09-06 DIAGNOSIS — I10 Essential (primary) hypertension: Secondary | ICD-10-CM

## 2024-09-06 DIAGNOSIS — F1010 Alcohol abuse, uncomplicated: Secondary | ICD-10-CM

## 2024-09-06 DIAGNOSIS — J9383 Other pneumothorax: Secondary | ICD-10-CM
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
## 2024-09-07 NOTE — Telephone Encounter (Signed)
 Noted

## 2024-09-08 DIAGNOSIS — S2249XD Multiple fractures of ribs, unspecified side, subsequent encounter for fracture with routine healing: Secondary | ICD-10-CM | POA: Insufficient documentation

## 2024-09-12 ENCOUNTER — Other Ambulatory Visit
Admission: RE | Admit: 2024-09-12 | Discharge: 2024-09-12 | Disposition: A | Source: Ambulatory Visit | Attending: Specialist | Admitting: Specialist

## 2024-09-12 DIAGNOSIS — R079 Chest pain, unspecified: Secondary | ICD-10-CM | POA: Insufficient documentation

## 2024-09-12 LAB — D-DIMER, QUANTITATIVE: D-Dimer, Quant: 0.5 ug{FEU}/mL (ref 0.00–0.50)

## 2024-09-19 ENCOUNTER — Encounter: Payer: Self-pay | Admitting: Physician Assistant

## 2024-09-19 ENCOUNTER — Telehealth (INDEPENDENT_AMBULATORY_CARE_PROVIDER_SITE_OTHER): Admitting: Physician Assistant

## 2024-09-19 DIAGNOSIS — F418 Other specified anxiety disorders: Secondary | ICD-10-CM

## 2024-09-19 DIAGNOSIS — F419 Anxiety disorder, unspecified: Secondary | ICD-10-CM

## 2024-09-19 DIAGNOSIS — I10 Essential (primary) hypertension: Secondary | ICD-10-CM

## 2024-09-19 DIAGNOSIS — F1010 Alcohol abuse, uncomplicated: Secondary | ICD-10-CM

## 2024-09-19 DIAGNOSIS — S2241XS Multiple fractures of ribs, right side, sequela: Secondary | ICD-10-CM | POA: Diagnosis not present

## 2024-09-19 DIAGNOSIS — Z720 Tobacco use: Secondary | ICD-10-CM | POA: Diagnosis not present

## 2024-09-19 MED ORDER — LIDOCAINE 5 % EX PTCH: 1.0000 | MEDICATED_PATCH | CUTANEOUS | 0 refills | Status: AC

## 2024-09-19 MED ORDER — CELECOXIB 200 MG PO CAPS: 200.0000 mg | ORAL_CAPSULE | Freq: Two times a day (BID) | ORAL | 1 refills | Status: DC

## 2024-09-19 NOTE — Progress Notes (Unsigned)
 MyChart Video Visit  Virtual Visit via Video Note   This format is felt to be most appropriate for this patient at this time. Physical exam was limited by quality of the video and audio technology used for the visit.   Provider location: Virtual Visit Location Provider: Office/Clinic Patient Location: Home  I discussed the limitations of evaluation and management by telemedicine and the availability of in person appointments. The patient expressed understanding and agreed to proceed.  Patient: Taylor Medina   DOB: March 27, 1966   58 y.o. Male  MRN: [id number] Visit Date: 09/19/2024  Today's healthcare provider: Jolynn Spencer, PA-C   Chief Complaint  Patient presents with   Rib Injury   Subjective     Discussed the use of AI scribe software for clinical note transcription with the patient, who gave verbal consent to proceed.  History of Present Illness Taylor Medina is a 58 year old male who presents for follow-up regarding rib pain and work-related concerns.  He has intermittent rib pain with movement and breathing, rated 3 to 4 out of 10, and improving. He uses Celebrex  and lidocaine  patches with adequate relief.  He is planning return to work and notes his FMLA is approved through September 19, 2024.  He recently stopped drinking alcohol and feels better overall. He has arranged counseling with a therapist and completed an initial evaluation with [facility], where he has been assigned to a group therapy program and is awaiting further psychiatric recommendations.     Medications: Outpatient Medications Prior to Visit  Medication Sig   azithromycin  (ZITHROMAX ) 250 MG tablet On tab po for three more days   celecoxib  (CELEBREX ) 200 MG capsule Take 1 capsule (200 mg total) by mouth 2 (two) times daily.   fenofibrate  (TRICOR ) 48 MG tablet TAKE 1 TABLET BY MOUTH EVERY DAY   folic acid  (FOLVITE ) 1 MG tablet Take 1 tablet (1 mg total) by mouth daily.   ibuprofen (ADVIL) 800 MG tablet Take  800 mg by mouth every 8 (eight) hours as needed.   levothyroxine  (SYNTHROID ) 25 MCG tablet Take 1 tablet (25 mcg total) by mouth daily.   lidocaine  (LIDODERM ) 5 % Place 1 patch onto the skin daily. Remove & Discard patch within 12 hours or as directed by MD   lisinopril -hydrochlorothiazide  (ZESTORETIC ) 20-25 MG tablet Take 1 tablet by mouth daily.   metoprolol  succinate (TOPROL -XL) 25 MG 24 hr tablet TAKE 1 TABLET (25 MG TOTAL) BY MOUTH DAILY.   pantoprazole  (PROTONIX ) 40 MG tablet TAKE 1 TABLET BY MOUTH TWICE A DAY BEFORE MEALS   polyethylene glycol (MIRALAX  / GLYCOLAX ) 17 g packet Take 17 g by mouth daily as needed for moderate constipation.   tadalafil  (CIALIS ) 5 MG tablet Take 1 tablet (5 mg total) by mouth daily as needed for erectile dysfunction.   thiamine  (VITAMIN B-1) 100 MG tablet Take 1 tablet (100 mg total) by mouth daily.   No facility-administered medications prior to visit.    Review of Systems All negative  Except see HPI       Objective    There were no vitals taken for this visit.    Physical Exam  Constitutional:      General: She is not in acute distress.    Appearance: Normal appearance.  HENT:     Head: Normocephalic.  Pulmonary:     Effort: Pulmonary effort is normal. No respiratory distress.  Neurological:     Mental Status: She is alert and oriented to person, place,  and time. Mental status is at baseline.    Assessment & Plan Adult Office Visit Blood pressure controlled at 125/80 mmHg. Oxygen saturation 98-99%, pulse 78 bpm. Overall well-being reported. - Sent work note for clearance to return to work without restrictions.  Closed fracture of multiple ribs of right side, sequela (Primary) Rib fracture with residual pain Residual rib pain 3-4/10, intermittent, improving. - Use lidocaine  pain patches as needed. Use celebrex  bid as needed Will follow-up  Alcohol dependence, in remission Alcohol dependence in remission. Reports improvement  since quitting. Attending group meetings and therapy. - Continue attending group meetings and therapy appointments. - Discuss intermittent FMLA with HR for appointment flexibility.  Anxiety and depression Behavioral health follow-up Upcoming appointment with counselor Tawni Forte on the 29th. Initial evaluation with [facility] completed. Plan to assess need for psychiatry referral. - Attend appointment with counselor Tawni Forte on the 29th. - Continue follow-up with [facility] and attend group meetings. - Discuss potential psychiatry referral based on assessment results.  Tobacco use Chronic Cessation advised Will follow-up  Primary hypertension Chronic and stable Continue zestoretic  20-25, metoprolol  25 Continue lifestyle modifications Will follow-up  Work note was provided  No follow-ups on file.     I discussed the assessment and treatment plan with the patient. The patient was provided an opportunity to ask questions and all were answered. The patient agreed with the plan and demonstrated an understanding of the instructions.   The patient was advised to call back or seek an in-person evaluation if the symptoms worsen or if the condition fails to improve as anticipated.  I, Mikaella Escalona, PA-C have reviewed all documentation for this visit. The documentation on 09/19/2024  for the exam, diagnosis, procedures, and orders are all accurate and complete.  Jolynn Spencer, [facility], MMS [facility] [phone] (phone) [phone] (fax)  [facility]

## 2024-09-20 DIAGNOSIS — F419 Anxiety disorder, unspecified: Secondary | ICD-10-CM | POA: Insufficient documentation

## 2024-09-23 ENCOUNTER — Ambulatory Visit: Admitting: Physician Assistant

## 2024-10-10 ENCOUNTER — Ambulatory Visit: Admitting: Licensed Clinical Social Worker

## 2024-10-10 DIAGNOSIS — F4322 Adjustment disorder with anxiety: Secondary | ICD-10-CM

## 2024-10-10 DIAGNOSIS — F1011 Alcohol abuse, in remission: Secondary | ICD-10-CM

## 2024-10-10 NOTE — Addendum Note (Signed)
 Addended by: Nelani Schmelzle on: 10/10/2024 04:52 PM   Modules accepted: Level of Service

## 2024-10-10 NOTE — Patient Instructions (Signed)
 Using Behavioral Activation to manage stress/depression symptoms     Identify/understand your own mood triggers.   Structure your day--get up around the same time, eat meals/snacks around the same time, go to bed around the same time.   Purposefully schedule self care time and time to complete tasks. This can include quiet time.  Stimulate your brain--go for a walk, text/call a friend or family member, if you are indoors--go outside (and vice versa), go for a drive, go to a store with bright colors and bright lights. Try to do things in a different way--drive to your favorite places using an alternative route, or instead of starting on the right side of the grocery store when shopping, start on the left side. You might feel a bit uncomfortable doing things outside of the comfort zone, but this is helping the brain create new neural pathways and is very healthy for brain/emotional health.   Physical movement based on your ability. If you can go for a walk, do stretches, even waving your hands to music can trigger feel-good endorphins in the brain and help release physical tension we all hold in our bodies.  Even 5 minutes can make a difference.   Be intentional about doing things that bring you joy (or used to bring you joy), and look for the things in every day that make you happy.  Seek those glimmers of joy each day.  Set a timer for 5 minutes for a harder task (ex. Laundry, washing dishes).  Allow yourself to work distraction-free for 5 minutes, then stop when the timer goes off. If you need a break, take a break. If you want to continue working then set another timer for whatever time you choose.   Limit or eliminate substance use including alcohol, marijuana, or recreational use of prescription medication.  Let in the light!! Open the window blinds, curtains and let natural light in. Even sitting near a window or sitting outside can boost your mood, especially in the wintertime when  there is less daylight.   If you take medication to manage symptoms, remember to take all medications as they are prescribed (please read all labels!!)    Things to envision for ourselves to to improve inspiration, motivation, and initiative :  improving physical wellness, focus on family relationships, focusing on our own mental/emotional well being, being a part of a bigger community, finding a hobby, being a part of something that fosters personal growth, engaging socially with others (even digitally!!!)      ANXIETY/PANIC EPISODE MANAGEMENT (CBT/MINDFULNESS BASED)   If you are in a highly stimulating or triggering environment, change your location to a less stimulating or safer environment .   Stimulate your senses by tasting/eating a sour candy (such as a lemon drop) or a strong flavored cough drop.  This triggers smell, taste, and touch since we  have had lots of nerve endings inside of your mouth.   Practice slow, controlled breathing to avoid hyperventilation.  Breathing into your nose for the count of 4 inside your head, holding your breath for the count of 4 inside your head, and exhale slowly counting to 8 inside your head.  This is called 4-4-8 breathing, or triangle breathing. (Controlled breathing). This helps manage panic, anxiety, anger, and tearfulness.  Additional grounding exercises include rubbing your hands softly together, or wiggling toes inside your shoes, pretending to grasp the floor with your toes.    Listen to calming music or sounds  Count backwards in your  head by twos or tens or recite multiplication tables in your head.  Visualize a calming, happy place and identify 5 explicit details about this place (color, temperature, smells, visual details, etc.)  Splash cold water on your face or hold an ice cube in your hand (alternate hands)  Clench and release muscle groups (hands, shoulders, facial muscles)  Engage in soothing activities to recover after  a stressful/anxious episode such as: drinking a warm beverage, sitting in your favorite comfortable location, positive physical contact with a pet or weighted blanket, using positive self talk/positive affirmations.   Download PTSD Coach app for your phone or tablet--its free and has lots of tips to help you manage panic episodes no matter where you are

## 2024-10-10 NOTE — Addendum Note (Signed)
 Addended by: Joeline Freer on: 10/10/2024 04:30 PM   Modules accepted: Level of Service
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
## 2024-10-12 ENCOUNTER — Telehealth (INDEPENDENT_AMBULATORY_CARE_PROVIDER_SITE_OTHER): Payer: Self-pay | Admitting: Licensed Clinical Social Worker

## 2024-10-12 DIAGNOSIS — F4322 Adjustment disorder with anxiety: Secondary | ICD-10-CM
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
## 2024-10-18 ENCOUNTER — Other Ambulatory Visit: Payer: Self-pay

## 2024-10-18 ENCOUNTER — Telehealth: Payer: Self-pay | Admitting: Physician Assistant

## 2024-10-18 DIAGNOSIS — I10 Essential (primary) hypertension: Secondary | ICD-10-CM

## 2024-10-18 MED ORDER — METOPROLOL SUCCINATE ER 25 MG PO TB24: 25.0000 mg | ORAL_TABLET | Freq: Every day | ORAL | 3 refills | Status: AC

## 2024-10-18 NOTE — Telephone Encounter (Signed)
[facility] faxed refill request for the following medications:   metoprolol succinate (TOPROL-XL) 25 MG 24 hr tablet    Please advise.  

## 2024-10-18 NOTE — Telephone Encounter (Signed)
 Converted and refilled

## 2024-11-11 ENCOUNTER — Ambulatory Visit: Admitting: Licensed Clinical Social Worker

## 2024-11-11 NOTE — BH Specialist Note (Signed)
 [facility] Collaborative Care Clinician attempted to connect with Taylor Medina for an appointment scheduled 11/11/24 at  3:30 PM EST via [facility] (EPIC/MyChart).  Pt was sent invitation to connect via text and email x 2.  Pt did not connect in virtual room--after 15 minutes, clinician closed out [facility] (EPIC/MyChart) virtual room.   Clinician attempted to connect w/ patient via phone--clinician left HIPAA compliant voice mail to call office to reschedule appt.    Clinician spent total of 15 minutes attempting to connect with patient for scheduled visit.

## 2024-11-12 ENCOUNTER — Other Ambulatory Visit: Payer: Self-pay | Admitting: Physician Assistant

## 2024-11-12 DIAGNOSIS — K2100 Gastro-esophageal reflux disease with esophagitis, without bleeding: Secondary | ICD-10-CM

## 2024-11-14 ENCOUNTER — Other Ambulatory Visit: Payer: Self-pay | Admitting: Physician Assistant

## 2024-11-14 DIAGNOSIS — S2241XS Multiple fractures of ribs, right side, sequela: Secondary | ICD-10-CM
